# Patient Record
Sex: Female | Born: 1955 | Race: White | Hispanic: No | Marital: Married | Smoking: Never smoker
Health system: Southern US, Community
[De-identification: ages and names within clinical notes are randomized; demographics above are authoritative.]

## PROBLEM LIST (undated history)

## (undated) DIAGNOSIS — D649 Anemia, unspecified: Secondary | ICD-10-CM

## (undated) DIAGNOSIS — E785 Hyperlipidemia, unspecified: Secondary | ICD-10-CM

## (undated) DIAGNOSIS — I639 Cerebral infarction, unspecified: Secondary | ICD-10-CM

## (undated) DIAGNOSIS — K635 Polyp of colon: Secondary | ICD-10-CM

## (undated) DIAGNOSIS — Z21 Asymptomatic human immunodeficiency virus [HIV] infection status: Secondary | ICD-10-CM

## (undated) DIAGNOSIS — B029 Zoster without complications: Secondary | ICD-10-CM

## (undated) DIAGNOSIS — R519 Headache, unspecified: Secondary | ICD-10-CM

## (undated) DIAGNOSIS — R51 Headache: Secondary | ICD-10-CM

## (undated) DIAGNOSIS — E7800 Pure hypercholesterolemia, unspecified: Secondary | ICD-10-CM

## (undated) DIAGNOSIS — K648 Other hemorrhoids: Secondary | ICD-10-CM

## (undated) DIAGNOSIS — D591 Autoimmune hemolytic anemia, unspecified: Secondary | ICD-10-CM

## (undated) DIAGNOSIS — B589 Toxoplasmosis, unspecified: Secondary | ICD-10-CM

## (undated) HISTORY — DX: Anemia, unspecified: D64.9

## (undated) HISTORY — DX: Polyp of colon: K63.5

## (undated) HISTORY — DX: Zoster without complications: B02.9

## (undated) HISTORY — DX: Cerebral infarction, unspecified: I63.9

## (undated) HISTORY — PX: SHOULDER SURGERY: SHX246

## (undated) HISTORY — PX: KNEE ARTHROSCOPY: SUR90

## (undated) HISTORY — DX: Pure hypercholesterolemia, unspecified: E78.00

## (undated) HISTORY — PX: TONSILLECTOMY: SUR1361

## (undated) HISTORY — DX: Toxoplasmosis, unspecified: B58.9

## (undated) HISTORY — DX: Headache, unspecified: R51.9

## (undated) HISTORY — DX: Other hemorrhoids: K64.8

## (undated) HISTORY — DX: Hyperlipidemia, unspecified: E78.5

## (undated) HISTORY — DX: Headache: R51

---

## 1985-07-24 DIAGNOSIS — Z21 Asymptomatic human immunodeficiency virus [HIV] infection status: Secondary | ICD-10-CM

## 1985-07-24 HISTORY — DX: Asymptomatic human immunodeficiency virus (hiv) infection status: Z21

## 1997-12-21 ENCOUNTER — Encounter: Admission: RE | Admit: 1997-12-21 | Discharge: 1997-12-21 | Payer: Self-pay | Admitting: Internal Medicine

## 1998-01-04 ENCOUNTER — Encounter: Admission: RE | Admit: 1998-01-04 | Discharge: 1998-01-04 | Payer: Self-pay | Admitting: Internal Medicine

## 1998-03-16 ENCOUNTER — Encounter: Admission: RE | Admit: 1998-03-16 | Discharge: 1998-03-16 | Payer: Self-pay | Admitting: Internal Medicine

## 1998-03-30 ENCOUNTER — Encounter: Admission: RE | Admit: 1998-03-30 | Discharge: 1998-03-30 | Payer: Self-pay | Admitting: Internal Medicine

## 1998-04-02 ENCOUNTER — Emergency Department (HOSPITAL_COMMUNITY): Admission: EM | Admit: 1998-04-02 | Discharge: 1998-04-02 | Payer: Self-pay | Admitting: Emergency Medicine

## 1998-05-19 ENCOUNTER — Encounter: Admission: RE | Admit: 1998-05-19 | Discharge: 1998-05-19 | Payer: Self-pay | Admitting: Hematology and Oncology

## 1998-06-22 ENCOUNTER — Encounter: Admission: RE | Admit: 1998-06-22 | Discharge: 1998-06-22 | Payer: Self-pay | Admitting: Internal Medicine

## 1998-07-06 ENCOUNTER — Encounter: Admission: RE | Admit: 1998-07-06 | Discharge: 1998-07-06 | Payer: Self-pay | Admitting: Internal Medicine

## 1998-07-24 DIAGNOSIS — I639 Cerebral infarction, unspecified: Secondary | ICD-10-CM

## 1998-07-24 HISTORY — DX: Cerebral infarction, unspecified: I63.9

## 1998-08-12 ENCOUNTER — Other Ambulatory Visit: Admission: RE | Admit: 1998-08-12 | Discharge: 1998-08-12 | Payer: Self-pay | Admitting: Obstetrics and Gynecology

## 1998-09-05 ENCOUNTER — Observation Stay (HOSPITAL_COMMUNITY): Admission: EM | Admit: 1998-09-05 | Discharge: 1998-09-06 | Payer: Self-pay | Admitting: Emergency Medicine

## 1998-09-05 ENCOUNTER — Encounter: Payer: Self-pay | Admitting: Emergency Medicine

## 1998-09-13 ENCOUNTER — Encounter (INDEPENDENT_AMBULATORY_CARE_PROVIDER_SITE_OTHER): Payer: Self-pay | Admitting: *Deleted

## 1998-09-13 ENCOUNTER — Encounter: Admission: RE | Admit: 1998-09-13 | Discharge: 1998-09-13 | Payer: Self-pay | Admitting: Infectious Diseases

## 1998-09-13 LAB — CONVERTED CEMR LAB: CD4 Count: 280 microliters

## 1998-09-15 ENCOUNTER — Other Ambulatory Visit: Admission: RE | Admit: 1998-09-15 | Discharge: 1998-09-15 | Payer: Self-pay | Admitting: Internal Medicine

## 1998-09-28 ENCOUNTER — Encounter: Payer: Self-pay | Admitting: Internal Medicine

## 1998-09-28 ENCOUNTER — Encounter: Admission: RE | Admit: 1998-09-28 | Discharge: 1998-09-28 | Payer: Self-pay | Admitting: Internal Medicine

## 1998-09-28 ENCOUNTER — Ambulatory Visit (HOSPITAL_COMMUNITY): Admission: RE | Admit: 1998-09-28 | Discharge: 1998-09-28 | Payer: Self-pay | Admitting: Internal Medicine

## 1998-11-22 ENCOUNTER — Encounter (INDEPENDENT_AMBULATORY_CARE_PROVIDER_SITE_OTHER): Payer: Self-pay | Admitting: *Deleted

## 1998-12-13 ENCOUNTER — Ambulatory Visit (HOSPITAL_COMMUNITY): Admission: RE | Admit: 1998-12-13 | Discharge: 1998-12-13 | Payer: Self-pay | Admitting: Infectious Diseases

## 1998-12-28 ENCOUNTER — Encounter: Admission: RE | Admit: 1998-12-28 | Discharge: 1998-12-28 | Payer: Self-pay | Admitting: Internal Medicine

## 1999-02-01 ENCOUNTER — Encounter (INDEPENDENT_AMBULATORY_CARE_PROVIDER_SITE_OTHER): Payer: Self-pay | Admitting: *Deleted

## 1999-05-19 ENCOUNTER — Encounter: Admission: RE | Admit: 1999-05-19 | Discharge: 1999-05-19 | Payer: Self-pay | Admitting: Internal Medicine

## 1999-06-24 ENCOUNTER — Encounter: Payer: Self-pay | Admitting: Emergency Medicine

## 1999-06-24 ENCOUNTER — Emergency Department (HOSPITAL_COMMUNITY): Admission: EM | Admit: 1999-06-24 | Discharge: 1999-06-24 | Payer: Self-pay | Admitting: Podiatry

## 1999-06-27 ENCOUNTER — Encounter: Admission: RE | Admit: 1999-06-27 | Discharge: 1999-06-27 | Payer: Self-pay | Admitting: Internal Medicine

## 1999-06-27 ENCOUNTER — Ambulatory Visit (HOSPITAL_COMMUNITY): Admission: RE | Admit: 1999-06-27 | Discharge: 1999-06-27 | Payer: Self-pay | Admitting: Internal Medicine

## 1999-07-11 ENCOUNTER — Encounter: Admission: RE | Admit: 1999-07-11 | Discharge: 1999-07-11 | Payer: Self-pay | Admitting: Internal Medicine

## 1999-07-25 HISTORY — PX: APPENDECTOMY: SHX54

## 1999-09-29 ENCOUNTER — Other Ambulatory Visit: Admission: RE | Admit: 1999-09-29 | Discharge: 1999-09-29 | Payer: Self-pay | Admitting: Obstetrics and Gynecology

## 1999-12-26 ENCOUNTER — Encounter: Admission: RE | Admit: 1999-12-26 | Discharge: 1999-12-26 | Payer: Self-pay | Admitting: Internal Medicine

## 1999-12-26 ENCOUNTER — Ambulatory Visit (HOSPITAL_COMMUNITY): Admission: RE | Admit: 1999-12-26 | Discharge: 1999-12-26 | Payer: Self-pay | Admitting: Internal Medicine

## 1999-12-28 ENCOUNTER — Inpatient Hospital Stay (HOSPITAL_COMMUNITY): Admission: EM | Admit: 1999-12-28 | Discharge: 1999-12-30 | Payer: Self-pay | Admitting: Emergency Medicine

## 1999-12-28 ENCOUNTER — Encounter: Payer: Self-pay | Admitting: Emergency Medicine

## 1999-12-29 ENCOUNTER — Encounter: Payer: Self-pay | Admitting: Emergency Medicine

## 1999-12-29 ENCOUNTER — Encounter: Payer: Self-pay | Admitting: Internal Medicine

## 2000-01-06 ENCOUNTER — Encounter: Admission: RE | Admit: 2000-01-06 | Discharge: 2000-01-06 | Payer: Self-pay | Admitting: Internal Medicine

## 2000-01-31 ENCOUNTER — Encounter: Admission: RE | Admit: 2000-01-31 | Discharge: 2000-01-31 | Payer: Self-pay | Admitting: Internal Medicine

## 2000-02-20 ENCOUNTER — Encounter: Admission: RE | Admit: 2000-02-20 | Discharge: 2000-02-20 | Payer: Self-pay | Admitting: Internal Medicine

## 2000-03-02 ENCOUNTER — Ambulatory Visit (HOSPITAL_COMMUNITY): Admission: RE | Admit: 2000-03-02 | Discharge: 2000-03-02 | Payer: Self-pay | Admitting: Neurology

## 2000-04-16 ENCOUNTER — Encounter: Admission: RE | Admit: 2000-04-16 | Discharge: 2000-04-16 | Payer: Self-pay | Admitting: Internal Medicine

## 2000-04-16 ENCOUNTER — Ambulatory Visit (HOSPITAL_COMMUNITY): Admission: RE | Admit: 2000-04-16 | Discharge: 2000-04-16 | Payer: Self-pay | Admitting: Internal Medicine

## 2000-05-01 ENCOUNTER — Encounter: Admission: RE | Admit: 2000-05-01 | Discharge: 2000-05-01 | Payer: Self-pay | Admitting: Internal Medicine

## 2000-06-27 ENCOUNTER — Ambulatory Visit (HOSPITAL_COMMUNITY): Admission: RE | Admit: 2000-06-27 | Discharge: 2000-06-27 | Payer: Self-pay | Admitting: Neurology

## 2000-06-27 ENCOUNTER — Encounter: Payer: Self-pay | Admitting: Neurology

## 2000-07-25 ENCOUNTER — Encounter: Admission: RE | Admit: 2000-07-25 | Discharge: 2000-07-25 | Payer: Self-pay | Admitting: Internal Medicine

## 2000-07-25 ENCOUNTER — Ambulatory Visit (HOSPITAL_COMMUNITY): Admission: RE | Admit: 2000-07-25 | Discharge: 2000-07-25 | Payer: Self-pay | Admitting: Internal Medicine

## 2000-08-09 ENCOUNTER — Encounter: Admission: RE | Admit: 2000-08-09 | Discharge: 2000-08-09 | Payer: Self-pay | Admitting: Internal Medicine

## 2000-10-22 ENCOUNTER — Ambulatory Visit (HOSPITAL_COMMUNITY): Admission: RE | Admit: 2000-10-22 | Discharge: 2000-10-22 | Payer: Self-pay | Admitting: Internal Medicine

## 2000-10-22 ENCOUNTER — Encounter: Admission: RE | Admit: 2000-10-22 | Discharge: 2000-10-22 | Payer: Self-pay | Admitting: Internal Medicine

## 2000-11-06 ENCOUNTER — Encounter: Admission: RE | Admit: 2000-11-06 | Discharge: 2000-11-06 | Payer: Self-pay | Admitting: Internal Medicine

## 2000-11-19 ENCOUNTER — Other Ambulatory Visit: Admission: RE | Admit: 2000-11-19 | Discharge: 2000-11-19 | Payer: Self-pay | Admitting: Obstetrics and Gynecology

## 2001-02-13 ENCOUNTER — Ambulatory Visit (HOSPITAL_COMMUNITY): Admission: RE | Admit: 2001-02-13 | Discharge: 2001-02-13 | Payer: Self-pay | Admitting: Internal Medicine

## 2001-02-13 ENCOUNTER — Encounter: Admission: RE | Admit: 2001-02-13 | Discharge: 2001-02-13 | Payer: Self-pay | Admitting: Internal Medicine

## 2001-03-05 ENCOUNTER — Encounter: Admission: RE | Admit: 2001-03-05 | Discharge: 2001-03-05 | Payer: Self-pay | Admitting: Internal Medicine

## 2001-03-19 ENCOUNTER — Encounter: Admission: RE | Admit: 2001-03-19 | Discharge: 2001-03-19 | Payer: Self-pay | Admitting: Internal Medicine

## 2001-05-21 ENCOUNTER — Encounter: Admission: RE | Admit: 2001-05-21 | Discharge: 2001-05-21 | Payer: Self-pay | Admitting: Internal Medicine

## 2001-05-21 ENCOUNTER — Ambulatory Visit (HOSPITAL_COMMUNITY): Admission: RE | Admit: 2001-05-21 | Discharge: 2001-05-21 | Payer: Self-pay | Admitting: Internal Medicine

## 2001-06-04 ENCOUNTER — Encounter: Admission: RE | Admit: 2001-06-04 | Discharge: 2001-06-04 | Payer: Self-pay | Admitting: Internal Medicine

## 2001-10-08 ENCOUNTER — Encounter: Admission: RE | Admit: 2001-10-08 | Discharge: 2001-10-08 | Payer: Self-pay | Admitting: Internal Medicine

## 2001-10-08 ENCOUNTER — Ambulatory Visit (HOSPITAL_COMMUNITY): Admission: RE | Admit: 2001-10-08 | Discharge: 2001-10-08 | Payer: Self-pay | Admitting: Internal Medicine

## 2001-10-22 ENCOUNTER — Encounter: Admission: RE | Admit: 2001-10-22 | Discharge: 2001-10-22 | Payer: Self-pay | Admitting: Internal Medicine

## 2001-12-02 ENCOUNTER — Encounter: Admission: RE | Admit: 2001-12-02 | Discharge: 2001-12-02 | Payer: Self-pay | Admitting: Internal Medicine

## 2001-12-27 ENCOUNTER — Other Ambulatory Visit: Admission: RE | Admit: 2001-12-27 | Discharge: 2001-12-27 | Payer: Self-pay | Admitting: Obstetrics and Gynecology

## 2002-01-20 ENCOUNTER — Encounter: Admission: RE | Admit: 2002-01-20 | Discharge: 2002-01-20 | Payer: Self-pay | Admitting: Internal Medicine

## 2002-02-18 ENCOUNTER — Encounter: Admission: RE | Admit: 2002-02-18 | Discharge: 2002-02-18 | Payer: Self-pay | Admitting: Internal Medicine

## 2002-05-06 ENCOUNTER — Ambulatory Visit (HOSPITAL_COMMUNITY): Admission: RE | Admit: 2002-05-06 | Discharge: 2002-05-06 | Payer: Self-pay | Admitting: Internal Medicine

## 2002-05-06 ENCOUNTER — Encounter: Admission: RE | Admit: 2002-05-06 | Discharge: 2002-05-06 | Payer: Self-pay | Admitting: Internal Medicine

## 2002-05-20 ENCOUNTER — Encounter: Admission: RE | Admit: 2002-05-20 | Discharge: 2002-05-20 | Payer: Self-pay | Admitting: Infectious Diseases

## 2002-12-22 ENCOUNTER — Encounter: Payer: Self-pay | Admitting: Internal Medicine

## 2002-12-22 ENCOUNTER — Encounter: Admission: RE | Admit: 2002-12-22 | Discharge: 2002-12-22 | Payer: Self-pay | Admitting: Internal Medicine

## 2003-01-06 ENCOUNTER — Encounter: Admission: RE | Admit: 2003-01-06 | Discharge: 2003-01-06 | Payer: Self-pay | Admitting: Internal Medicine

## 2003-03-10 ENCOUNTER — Other Ambulatory Visit: Admission: RE | Admit: 2003-03-10 | Discharge: 2003-03-10 | Payer: Self-pay | Admitting: Obstetrics and Gynecology

## 2003-05-18 ENCOUNTER — Encounter: Payer: Self-pay | Admitting: Internal Medicine

## 2003-05-18 ENCOUNTER — Encounter: Admission: RE | Admit: 2003-05-18 | Discharge: 2003-05-18 | Payer: Self-pay | Admitting: Internal Medicine

## 2003-05-18 ENCOUNTER — Ambulatory Visit (HOSPITAL_COMMUNITY): Admission: RE | Admit: 2003-05-18 | Discharge: 2003-05-18 | Payer: Self-pay | Admitting: Internal Medicine

## 2003-06-02 ENCOUNTER — Encounter: Admission: RE | Admit: 2003-06-02 | Discharge: 2003-06-02 | Payer: Self-pay | Admitting: Internal Medicine

## 2003-12-17 ENCOUNTER — Ambulatory Visit (HOSPITAL_COMMUNITY): Admission: RE | Admit: 2003-12-17 | Discharge: 2003-12-17 | Payer: Self-pay | Admitting: Internal Medicine

## 2003-12-17 ENCOUNTER — Encounter: Admission: RE | Admit: 2003-12-17 | Discharge: 2003-12-17 | Payer: Self-pay | Admitting: Internal Medicine

## 2003-12-31 ENCOUNTER — Encounter: Admission: RE | Admit: 2003-12-31 | Discharge: 2003-12-31 | Payer: Self-pay | Admitting: Internal Medicine

## 2004-05-18 ENCOUNTER — Other Ambulatory Visit: Admission: RE | Admit: 2004-05-18 | Discharge: 2004-05-18 | Payer: Self-pay | Admitting: Obstetrics and Gynecology

## 2004-06-21 ENCOUNTER — Ambulatory Visit (HOSPITAL_COMMUNITY): Admission: RE | Admit: 2004-06-21 | Discharge: 2004-06-21 | Payer: Self-pay | Admitting: Internal Medicine

## 2004-06-21 ENCOUNTER — Ambulatory Visit: Payer: Self-pay | Admitting: Internal Medicine

## 2004-07-05 ENCOUNTER — Ambulatory Visit: Payer: Self-pay | Admitting: Internal Medicine

## 2005-01-03 ENCOUNTER — Ambulatory Visit (HOSPITAL_COMMUNITY): Admission: RE | Admit: 2005-01-03 | Discharge: 2005-01-03 | Payer: Self-pay | Admitting: Internal Medicine

## 2005-01-03 ENCOUNTER — Encounter (INDEPENDENT_AMBULATORY_CARE_PROVIDER_SITE_OTHER): Payer: Self-pay | Admitting: *Deleted

## 2005-01-03 ENCOUNTER — Ambulatory Visit: Payer: Self-pay | Admitting: Internal Medicine

## 2005-01-03 LAB — CONVERTED CEMR LAB
CD4 Count: 410 microliters
HIV 1 RNA Quant: 399 copies/mL

## 2005-01-17 ENCOUNTER — Ambulatory Visit: Payer: Self-pay | Admitting: Internal Medicine

## 2005-06-08 ENCOUNTER — Ambulatory Visit: Payer: Self-pay | Admitting: Internal Medicine

## 2005-06-12 ENCOUNTER — Other Ambulatory Visit: Admission: RE | Admit: 2005-06-12 | Discharge: 2005-06-12 | Payer: Self-pay | Admitting: Obstetrics and Gynecology

## 2005-08-09 ENCOUNTER — Encounter (INDEPENDENT_AMBULATORY_CARE_PROVIDER_SITE_OTHER): Payer: Self-pay | Admitting: *Deleted

## 2005-08-09 ENCOUNTER — Ambulatory Visit: Payer: Self-pay | Admitting: Internal Medicine

## 2005-08-09 ENCOUNTER — Ambulatory Visit (HOSPITAL_COMMUNITY): Admission: RE | Admit: 2005-08-09 | Discharge: 2005-08-09 | Payer: Self-pay | Admitting: Internal Medicine

## 2005-08-23 ENCOUNTER — Ambulatory Visit: Payer: Self-pay | Admitting: Internal Medicine

## 2005-08-31 ENCOUNTER — Ambulatory Visit (HOSPITAL_COMMUNITY): Admission: RE | Admit: 2005-08-31 | Discharge: 2005-08-31 | Payer: Self-pay | Admitting: Family Medicine

## 2006-01-23 ENCOUNTER — Ambulatory Visit: Payer: Self-pay | Admitting: Internal Medicine

## 2006-01-23 ENCOUNTER — Encounter: Admission: RE | Admit: 2006-01-23 | Discharge: 2006-01-23 | Payer: Self-pay | Admitting: Internal Medicine

## 2006-01-23 ENCOUNTER — Encounter (INDEPENDENT_AMBULATORY_CARE_PROVIDER_SITE_OTHER): Payer: Self-pay | Admitting: *Deleted

## 2006-01-23 LAB — CONVERTED CEMR LAB
CD4 Count: 470 microliters
HIV 1 RNA Quant: 399 copies/mL

## 2006-02-12 ENCOUNTER — Ambulatory Visit: Payer: Self-pay | Admitting: Internal Medicine

## 2006-04-20 ENCOUNTER — Encounter (INDEPENDENT_AMBULATORY_CARE_PROVIDER_SITE_OTHER): Payer: Self-pay | Admitting: *Deleted

## 2006-09-14 ENCOUNTER — Encounter: Payer: Self-pay | Admitting: Internal Medicine

## 2006-09-17 ENCOUNTER — Encounter (INDEPENDENT_AMBULATORY_CARE_PROVIDER_SITE_OTHER): Payer: Self-pay | Admitting: *Deleted

## 2006-09-17 LAB — CONVERTED CEMR LAB

## 2006-09-27 ENCOUNTER — Encounter: Admission: RE | Admit: 2006-09-27 | Discharge: 2006-09-27 | Payer: Self-pay | Admitting: Internal Medicine

## 2006-09-27 ENCOUNTER — Ambulatory Visit: Payer: Self-pay | Admitting: Internal Medicine

## 2006-09-30 ENCOUNTER — Encounter (INDEPENDENT_AMBULATORY_CARE_PROVIDER_SITE_OTHER): Payer: Self-pay | Admitting: *Deleted

## 2006-10-02 DIAGNOSIS — A5609 Other chlamydial infection of lower genitourinary tract: Secondary | ICD-10-CM | POA: Insufficient documentation

## 2006-10-02 DIAGNOSIS — E881 Lipodystrophy, not elsewhere classified: Secondary | ICD-10-CM | POA: Insufficient documentation

## 2006-10-02 DIAGNOSIS — F29 Unspecified psychosis not due to a substance or known physiological condition: Secondary | ICD-10-CM | POA: Insufficient documentation

## 2006-10-02 DIAGNOSIS — R519 Headache, unspecified: Secondary | ICD-10-CM | POA: Insufficient documentation

## 2006-10-02 DIAGNOSIS — Z9889 Other specified postprocedural states: Secondary | ICD-10-CM | POA: Insufficient documentation

## 2006-10-02 DIAGNOSIS — B029 Zoster without complications: Secondary | ICD-10-CM | POA: Insufficient documentation

## 2006-10-02 DIAGNOSIS — Z8679 Personal history of other diseases of the circulatory system: Secondary | ICD-10-CM | POA: Insufficient documentation

## 2006-10-02 DIAGNOSIS — B3781 Candidal esophagitis: Secondary | ICD-10-CM | POA: Insufficient documentation

## 2006-10-02 DIAGNOSIS — R51 Headache: Secondary | ICD-10-CM | POA: Insufficient documentation

## 2006-10-02 DIAGNOSIS — G609 Hereditary and idiopathic neuropathy, unspecified: Secondary | ICD-10-CM | POA: Insufficient documentation

## 2006-10-11 ENCOUNTER — Encounter (INDEPENDENT_AMBULATORY_CARE_PROVIDER_SITE_OTHER): Payer: Self-pay | Admitting: *Deleted

## 2006-10-11 ENCOUNTER — Ambulatory Visit: Payer: Self-pay | Admitting: Internal Medicine

## 2006-10-11 DIAGNOSIS — B009 Herpesviral infection, unspecified: Secondary | ICD-10-CM | POA: Insufficient documentation

## 2006-10-11 DIAGNOSIS — R634 Abnormal weight loss: Secondary | ICD-10-CM | POA: Insufficient documentation

## 2006-10-11 DIAGNOSIS — B20 Human immunodeficiency virus [HIV] disease: Secondary | ICD-10-CM | POA: Insufficient documentation

## 2006-10-11 DIAGNOSIS — K625 Hemorrhage of anus and rectum: Secondary | ICD-10-CM | POA: Insufficient documentation

## 2006-10-11 DIAGNOSIS — R4701 Aphasia: Secondary | ICD-10-CM | POA: Insufficient documentation

## 2007-02-25 ENCOUNTER — Encounter (INDEPENDENT_AMBULATORY_CARE_PROVIDER_SITE_OTHER): Payer: Self-pay | Admitting: *Deleted

## 2007-03-27 ENCOUNTER — Encounter: Admission: RE | Admit: 2007-03-27 | Discharge: 2007-03-27 | Payer: Self-pay | Admitting: Internal Medicine

## 2007-03-27 ENCOUNTER — Ambulatory Visit: Payer: Self-pay | Admitting: Internal Medicine

## 2007-04-16 ENCOUNTER — Ambulatory Visit: Payer: Self-pay | Admitting: Internal Medicine

## 2007-05-27 ENCOUNTER — Encounter: Payer: Self-pay | Admitting: Internal Medicine

## 2007-06-24 ENCOUNTER — Telehealth: Payer: Self-pay | Admitting: Internal Medicine

## 2007-07-23 ENCOUNTER — Encounter: Payer: Self-pay | Admitting: Internal Medicine

## 2007-07-23 ENCOUNTER — Encounter (INDEPENDENT_AMBULATORY_CARE_PROVIDER_SITE_OTHER): Payer: Self-pay | Admitting: *Deleted

## 2007-08-20 ENCOUNTER — Encounter: Admission: RE | Admit: 2007-08-20 | Discharge: 2007-08-20 | Payer: Self-pay | Admitting: Internal Medicine

## 2007-08-20 ENCOUNTER — Ambulatory Visit: Payer: Self-pay | Admitting: Internal Medicine

## 2007-08-20 LAB — CONVERTED CEMR LAB
ALT: 47 units/L — ABNORMAL HIGH (ref 0–35)
Alkaline Phosphatase: 126 units/L — ABNORMAL HIGH (ref 39–117)
CO2: 25 meq/L (ref 19–32)
Cholesterol: 212 mg/dL — ABNORMAL HIGH (ref 0–200)
Creatinine, Ser: 0.92 mg/dL (ref 0.40–1.20)
Glucose, Bld: 77 mg/dL (ref 70–99)
HCT: 35.7 % — ABNORMAL LOW (ref 36.0–46.0)
HIV-1 RNA Quant, Log: 1.82 — ABNORMAL HIGH (ref <1.70)
LDL Cholesterol: 141 mg/dL — ABNORMAL HIGH (ref 0–99)
MCHC: 36.4 g/dL — ABNORMAL HIGH (ref 30.0–36.0)
MCV: 120.2 fL — ABNORMAL HIGH (ref 78.0–100.0)
Platelets: 164 10*3/uL (ref 150–400)
Sodium: 142 meq/L (ref 135–145)
Total Bilirubin: 0.5 mg/dL (ref 0.3–1.2)
Total CHOL/HDL Ratio: 3.8
Total Protein: 6.1 g/dL (ref 6.0–8.3)
Triglycerides: 76 mg/dL (ref <150)
VLDL: 15 mg/dL (ref 0–40)

## 2007-08-28 ENCOUNTER — Encounter (INDEPENDENT_AMBULATORY_CARE_PROVIDER_SITE_OTHER): Payer: Self-pay | Admitting: *Deleted

## 2007-09-03 ENCOUNTER — Ambulatory Visit: Payer: Self-pay | Admitting: Internal Medicine

## 2007-10-29 ENCOUNTER — Encounter (INDEPENDENT_AMBULATORY_CARE_PROVIDER_SITE_OTHER): Payer: Self-pay | Admitting: *Deleted

## 2007-11-27 ENCOUNTER — Encounter: Payer: Self-pay | Admitting: Internal Medicine

## 2008-03-19 ENCOUNTER — Ambulatory Visit: Payer: Self-pay | Admitting: Internal Medicine

## 2008-03-19 LAB — CONVERTED CEMR LAB: HIV-1 RNA Quant, Log: 2.45 — ABNORMAL HIGH (ref <1.70)

## 2008-03-31 ENCOUNTER — Encounter (INDEPENDENT_AMBULATORY_CARE_PROVIDER_SITE_OTHER): Payer: Self-pay | Admitting: *Deleted

## 2008-03-31 ENCOUNTER — Ambulatory Visit: Payer: Self-pay | Admitting: Internal Medicine

## 2008-05-11 ENCOUNTER — Ambulatory Visit: Payer: Self-pay | Admitting: Internal Medicine

## 2008-09-23 ENCOUNTER — Ambulatory Visit: Payer: Self-pay | Admitting: Internal Medicine

## 2008-09-23 LAB — CONVERTED CEMR LAB
Albumin: 4.5 g/dL (ref 3.5–5.2)
Alkaline Phosphatase: 149 units/L — ABNORMAL HIGH (ref 39–117)
BUN: 12 mg/dL (ref 6–23)
CO2: 24 meq/L (ref 19–32)
Calcium: 9.2 mg/dL (ref 8.4–10.5)
Chloride: 108 meq/L (ref 96–112)
Cholesterol: 227 mg/dL — ABNORMAL HIGH (ref 0–200)
Eosinophils Relative: 1 % (ref 0–5)
Glucose, Bld: 74 mg/dL (ref 70–99)
HCT: 36.5 % (ref 36.0–46.0)
HIV-1 RNA Quant, Log: <1.68 (ref <1.68)
Hemoglobin: 12.5 g/dL (ref 12.0–15.0)
LDL Cholesterol: 149 mg/dL — ABNORMAL HIGH (ref 0–99)
Lymphocytes Relative: 37 % (ref 12–46)
Lymphs Abs: 1.2 10*3/uL (ref 0.7–4.0)
MCV: 119.7 fL — ABNORMAL HIGH (ref 78.0–100.0)
Monocytes Absolute: 0.3 10*3/uL (ref 0.1–1.0)
Monocytes Relative: 10 % (ref 3–12)
Neutro Abs: 1.7 10*3/uL (ref 1.7–7.7)
Potassium: 4 meq/L (ref 3.5–5.3)
Sodium: 143 meq/L (ref 135–145)
Total Protein: 6.7 g/dL (ref 6.0–8.3)
Triglycerides: 107 mg/dL (ref <150)
WBC: 3.2 10*3/uL — ABNORMAL LOW (ref 4.0–10.5)

## 2008-10-13 ENCOUNTER — Ambulatory Visit: Payer: Self-pay | Admitting: Internal Medicine

## 2009-02-04 ENCOUNTER — Encounter (INDEPENDENT_AMBULATORY_CARE_PROVIDER_SITE_OTHER): Payer: Self-pay | Admitting: *Deleted

## 2009-02-04 ENCOUNTER — Encounter: Payer: Self-pay | Admitting: Internal Medicine

## 2009-02-16 ENCOUNTER — Encounter: Payer: Self-pay | Admitting: Internal Medicine

## 2009-05-05 ENCOUNTER — Ambulatory Visit: Payer: Self-pay | Admitting: Internal Medicine

## 2009-05-05 LAB — CONVERTED CEMR LAB
HIV 1 RNA Quant: <48 copies/mL (ref <48)
HIV-1 RNA Quant, Log: <1.68 (ref <1.68)

## 2009-05-06 ENCOUNTER — Encounter (INDEPENDENT_AMBULATORY_CARE_PROVIDER_SITE_OTHER): Payer: Self-pay | Admitting: *Deleted

## 2009-05-25 ENCOUNTER — Encounter (INDEPENDENT_AMBULATORY_CARE_PROVIDER_SITE_OTHER): Payer: Self-pay | Admitting: *Deleted

## 2009-05-25 ENCOUNTER — Ambulatory Visit: Payer: Self-pay | Admitting: Internal Medicine

## 2009-06-01 ENCOUNTER — Ambulatory Visit: Payer: Self-pay | Admitting: Infectious Disease

## 2009-07-06 ENCOUNTER — Ambulatory Visit: Payer: Self-pay | Admitting: Internal Medicine

## 2009-08-31 ENCOUNTER — Ambulatory Visit: Payer: Self-pay | Admitting: Internal Medicine

## 2009-09-28 ENCOUNTER — Ambulatory Visit: Payer: Self-pay | Admitting: Internal Medicine

## 2009-11-02 ENCOUNTER — Ambulatory Visit: Payer: Self-pay | Admitting: Internal Medicine

## 2009-11-22 ENCOUNTER — Ambulatory Visit: Payer: Self-pay | Admitting: Internal Medicine

## 2009-11-23 ENCOUNTER — Encounter: Payer: Self-pay | Admitting: Internal Medicine

## 2009-11-23 LAB — CONVERTED CEMR LAB
ALT: 17 units/L (ref 0–35)
AST: 23 units/L (ref 0–37)
Basophils Relative: 0 % (ref 0–1)
Creatinine, Ser: 1.01 mg/dL (ref 0.40–1.20)
Eosinophils Absolute: 0 10*3/uL (ref 0.0–0.7)
HIV 1 RNA Quant: <48 copies/mL (ref <48)
LDL Cholesterol: 115 mg/dL — ABNORMAL HIGH (ref 0–99)
Lymphs Abs: 1.5 10*3/uL (ref 0.7–4.0)
MCHC: 33.6 g/dL (ref 30.0–36.0)
MCV: 122.6 fL — ABNORMAL HIGH (ref 78.0–100.0)
Neutro Abs: 1.7 10*3/uL (ref 1.7–7.7)
Neutrophils Relative %: 47 % (ref 43–77)
Platelets: 168 10*3/uL (ref 150–400)
Sodium: 143 meq/L (ref 135–145)
Total Bilirubin: 0.4 mg/dL (ref 0.3–1.2)
Total CHOL/HDL Ratio: 3.6
VLDL: 18 mg/dL (ref 0–40)
WBC: 3.6 10*3/uL — ABNORMAL LOW (ref 4.0–10.5)

## 2009-11-30 ENCOUNTER — Ambulatory Visit: Payer: Self-pay | Admitting: Infectious Disease

## 2009-12-07 ENCOUNTER — Ambulatory Visit: Payer: Self-pay | Admitting: Internal Medicine

## 2010-03-15 ENCOUNTER — Encounter (INDEPENDENT_AMBULATORY_CARE_PROVIDER_SITE_OTHER): Payer: Self-pay | Admitting: *Deleted

## 2010-03-15 LAB — CONVERTED CEMR LAB: Pap Smear: NORMAL

## 2010-03-18 ENCOUNTER — Encounter: Payer: Self-pay | Admitting: Internal Medicine

## 2010-06-07 ENCOUNTER — Ambulatory Visit: Payer: Self-pay | Admitting: Internal Medicine

## 2010-06-28 ENCOUNTER — Ambulatory Visit: Payer: Self-pay | Admitting: Internal Medicine

## 2010-07-12 ENCOUNTER — Ambulatory Visit: Payer: Self-pay | Admitting: Internal Medicine

## 2010-07-12 DIAGNOSIS — M719 Bursopathy, unspecified: Secondary | ICD-10-CM

## 2010-07-12 DIAGNOSIS — M67919 Unspecified disorder of synovium and tendon, unspecified shoulder: Secondary | ICD-10-CM | POA: Insufficient documentation

## 2010-07-22 ENCOUNTER — Ambulatory Visit: Payer: Self-pay | Admitting: Internal Medicine

## 2010-07-27 ENCOUNTER — Encounter: Payer: Self-pay | Admitting: Internal Medicine

## 2010-08-03 ENCOUNTER — Encounter (INDEPENDENT_AMBULATORY_CARE_PROVIDER_SITE_OTHER): Payer: Self-pay | Admitting: *Deleted

## 2010-08-15 ENCOUNTER — Encounter: Payer: Self-pay | Admitting: Internal Medicine

## 2010-08-21 LAB — CONVERTED CEMR LAB
ALT: 15 units/L (ref 0–35)
AST: 18 units/L (ref 0–37)
Albumin: 4.7 g/dL (ref 3.5–5.2)
Alkaline Phosphatase: 117 units/L (ref 39–117)
BUN: 12 mg/dL (ref 6–23)
Basophils Absolute: 0 10*3/uL (ref 0.0–0.1)
Basophils Relative: 0 % (ref 0–1)
CO2: 20 meq/L (ref 19–32)
Calcium: 9.3 mg/dL (ref 8.4–10.5)
Chloride: 108 meq/L (ref 96–112)
Cholesterol: 180 mg/dL (ref 0–200)
Creatinine, Ser: 0.82 mg/dL (ref 0.40–1.20)
Eosinophils Absolute: 0 10*3/uL (ref 0.0–0.7)
Eosinophils Relative: 0 % (ref 0–5)
Glucose, Bld: 81 mg/dL (ref 70–99)
HCT: 39.4 % (ref 36.0–46.0)
HDL: 53 mg/dL (ref >39)
HIV 1 RNA Quant: <50 copies/mL (ref <50)
HIV-1 RNA Quant, Log: <1.7 (ref <1.70)
Hemoglobin: 13.5 g/dL (ref 12.0–15.0)
LDL Cholesterol: 114 mg/dL — ABNORMAL HIGH (ref 0–99)
Lymphocytes Relative: 33 % (ref 12–46)
Lymphs Abs: 1 10*3/uL (ref 0.7–3.3)
MCHC: 34.3 g/dL (ref 30.0–36.0)
MCV: 122 fL — ABNORMAL HIGH (ref 78.0–100.0)
Monocytes Absolute: 0.3 10*3/uL (ref 0.2–0.7)
Monocytes Relative: 9 % (ref 3–11)
Neutro Abs: 1.8 10*3/uL (ref 1.7–7.7)
Neutrophils Relative %: 58 % (ref 43–77)
Platelets: 179 10*3/uL (ref 150–400)
Potassium: 4.2 meq/L (ref 3.5–5.3)
RBC: 3.23 M/uL — ABNORMAL LOW (ref 3.87–5.11)
RDW: 13 % (ref 11.5–14.0)
Sodium: 143 meq/L (ref 135–145)
Total Bilirubin: 0.6 mg/dL (ref 0.3–1.2)
Total CHOL/HDL Ratio: 3.4
Total Protein: 6.8 g/dL (ref 6.0–8.3)
Triglycerides: 65 mg/dL (ref <150)
VLDL: 13 mg/dL (ref 0–40)
WBC: 3.1 10*3/uL — ABNORMAL LOW (ref 4.0–10.5)

## 2010-08-23 NOTE — Assessment & Plan Note (Signed)
Summary: CLASS [MKJ]   Allergies: 1)  ! Pcn 2)  ! Septra 3)  ! * Zerit  Patient attended a 1 hour Extreme Makeover: Lifestyle meeting today. The meeting included information about: cooking- a food demo, ,goal setting, problem solving, and exercises to strengthen the lower and upper body.   Please ask patient what they learned at their next visit.   Thank you for the referral.

## 2010-08-23 NOTE — Assessment & Plan Note (Signed)
Summary: EXTREME M/O CLASS/CH   Patient attended a 1 hour Extreme Makeover: Lifestyle meeting today. The meeting included information about: cooking- a food demo, healthy eating (portion control, lower and healthy fat, weight loss tips. .   Please ask patient what they learned at their next visit.   Thank you for the referral.

## 2010-08-23 NOTE — Progress Notes (Signed)
Summary: [facility]   Imported By: Sherian Rein 06/10/2010 12:55:10  _____________________________________________________________________  External Attachment:    Type:   Image     Comment:   External Document

## 2010-08-23 NOTE — Assessment & Plan Note (Signed)
Summary: CONSULT BEFORE COL PT ON PLAVIX.Theresa Morrison   History of Present Illness Visit Type: Initial Consult Primary GI MD: Yancey Flemings MD Primary Provider: Merri Brunette, MD Requesting Provider: Dr Harold Hedge Chief Complaint: Patient here to discuss having a colonoscopy. She denies any current GI problems. History of Present Illness:   55 year old female with a history of HIV disease, cerebrovascular accident for which she is on aspirin Plavix, lipodystrophy, peripheral neuropathy. She is sent today regarding routine screening colonoscopy. The patient underwent both upper endoscopy and complete colonoscopy in July of 2000 with Dr. Victorino Dike. These exams were normal. The patient denies any active GI symptoms except for chronic constipation which is intermittent.   GI Review of Systems      Denies abdominal pain, acid reflux, belching, bloating, chest pain, dysphagia with liquids, dysphagia with solids, heartburn, loss of appetite, nausea, vomiting, vomiting blood, weight loss, and  weight gain.      Reports constipation.     Denies anal fissure, black tarry stools, change in bowel habit, diarrhea, diverticulosis, fecal incontinence, heme positive stool, hemorrhoids, irritable bowel syndrome, jaundice, light color stool, liver problems, rectal bleeding, and  rectal pain. Preventive Screening-Counseling & Management  Alcohol-Tobacco     Smoking Status: never  Caffeine-Diet-Exercise     Does Patient Exercise: yes      Drug Use:  no.      Current Medications (verified): 1)  Truvada 200-300 Mg Tabs (Emtricitabine-Tenofovir) .... Take 1 Tablet By Mouth Once A Day 2)  Viramune 200 Mg Tabs (Nevirapine) .... Take 1 Tablet By Mouth Two Times A Day 3)  Aspirin  Tabs (Aspirin Tabs) .... Take 1 Tablet By Mouth Once A Day 4)  Plavix 75 Mg Tabs (Clopidogrel Bisulfate) .... Take 1 Tablet By Mouth Once A Day 5)  Calcium Carbonate 600 Mg Tabs (Calcium Carbonate) .... Take 1 Tablet By Mouth Once A Day 6)   Multivitamins   Tabs (Multiple Vitamin) .... Take 1 Tablet By Mouth Once A Day 7)  Fluzone   Inj (Influenza Virus Vaccine Split) .... One Yearly  Allergies (verified): 1)  ! Pcn 2)  ! Septra 3)  ! * Zerit  Past History:  Past Medical History: Reviewed history from 06/03/2010 and no changes required. Headache HIV disease Peripheral neuropathy Cerebrovascular accident, hx of Aphasia Rectal Bleeding Chlamydia cervicitis Weight loss Herpes simplex Candida esophagitis Lipodystrophy Zoster Chest pain Confusional state Hemorrhoids Anemia  Past Surgical History: Reviewed history from 10/02/2006 and no changes required. Appendectomy Tonsillectomy Knee surgery, right  Family History: Family History of Breast Cancer:Mother No FH of Colon Cancer: Family History of Prostate Cancer: Uncle Family History of Diabetes: Grandfather Family History of Heart Disease: Father  Social History: Patient has never smoked.  Alcohol Use - no Daily Caffeine Use-2 cups daily Illicit Drug Use - no Patient gets regular exercise. Drug Use:  no  Review of Systems  The patient denies allergy/sinus, anemia, anxiety-new, arthritis/joint pain, back pain, blood in urine, breast changes/lumps, change in vision, confusion, cough, coughing up blood, depression-new, fainting, fatigue, fever, headaches-new, hearing problems, heart murmur, heart rhythm changes, itching, menstrual pain, muscle pains/cramps, night sweats, nosebleeds, pregnancy symptoms, shortness of breath, skin rash, sleeping problems, sore throat, swelling of feet/legs, swollen lymph glands, thirst - excessive , urination - excessive , urination changes/pain, urine leakage, vision changes, and voice change.    Vital Signs:  Patient profile:   55 year old female Menstrual status:  postmenopausal Height:      64 inches  Weight:      170.50 pounds BMI:     29.37 BSA:     1.83 Pulse rate:   80 / minute Pulse rhythm:   regular BP  sitting:   114 / 64  (right arm)  Vitals Entered By: Lamona Curl CMA Duncan Dull) (June 07, 2010 10:29 AM)  Physical Exam  General:  Well developed, well nourished, no acute distress. Head:  Normocephalic and atraumatic. Eyes:  PERRLA, no icterus. Ears:  Normal auditory acuity. Mouth:  no thrush Neck:  Supple; no masses or thyromegaly. Lungs:  Clear throughout to auscultation. Heart:  Regular rate and rhythm; no murmurs, rubs,  or bruits. Abdomen:  Soft, nontender and nondistended. Trunkal obesity. No masses, hepatosplenomegaly or hernias noted. Normal bowel sounds. Rectal:  deferred until procedure Msk:  Symmetrical with no gross deformities. Normal posture. Pulses:  Normal pulses noted. Extremities:  No clubbing, cyanosis, edema or deformities noted. Neurologic:  Alert and  oriented x4. Skin:  Intact without significant lesions or rashes. Psych:  Alert and cooperative. Normal mood and affect.   Impression & Recommendations:  Problem # 1:  SCREENING COLORECTAL-CANCER (ICD-V76.51) screening colonoscopy. The patient is an appropriate candidate without contraindication. She isn't high risk given her need for Plavix therapy. We discussed the pros and cons of interrupting Plavix therapy. As such we have elected to proceed on Plavix and aspirin. In nature colonoscopy as well as risks, benefits, and alternatives were reviewed. She understood and agreed to proceed. Movi prep prescribed. The patient instructed on its use.  Problem # 2:  CVA (ICD-434.91) patient on aspirin and Plavix. We discussed the pros and cons of proceeding with colonoscopy on Plavix versus off Plavix. She understands the issues. See above.  Other Orders: Colonoscopy (Colon)  Patient Instructions: 1)  Colon [facility] 07/22/10 1:30 pm arrive at 12:30 pm 2)  Movi prep instructions given 3)  Movi prep Rx. sent to pharmacy. 4)  Stay on Plavix and Aspirin per Dr. Marina Goodell. 5)  Colonoscopy and Flexible Sigmoidoscopy  brochure given.  6)  Copy sent to : Merri Brunette, MD Dr Harold Hedge 7)  The medication list was reviewed and reconciled.  All changed / newly prescribed medications were explained.  A complete medication list was provided to the patient / caregiver. Prescriptions: MOVIPREP 100 GM  SOLR (PEG-KCL-NACL-NASULF-NA ASC-C) As per prep instructions.  #1 x 0   Entered by:   Milford Cage NCMA   Authorized by:   Hilarie Fredrickson MD   Signed by:   Milford Cage NCMA on 06/07/2010   Method used:   Electronically to        [facility] [phone]* (retail)       [address]       [place], [place]  [address]       Ph: [phone] or [phone]       Fax: [phone]   RxID:   [id number]

## 2010-08-23 NOTE — Letter (Signed)
Summary: [facility] Instructions  [facility]  [address] [place], [place] [address]   Phone: [phone]  Fax: [phone]       Theresa Morrison    1955/11/22    MRN: [id number]        Procedure Day Dorna Bloom: Farrell Ours  07/22/10     Arrival Time:12:30 PM     Procedure Time:1:30 PM     Location of Procedure:                    X  [facility] (4th Floor)              PREPARATION FOR COLONOSCOPY WITH MOVIPREP   Starting 5 days prior to your procedure 12/25/11do not eat nuts, seeds, popcorn, corn, beans, peas,  salads, or any raw vegetables.  Do not take any fiber supplements (e.g. Metamucil, Citrucel, and Benefiber).  THE DAY BEFORE YOUR PROCEDURE         DATE: 07/21/10  DAY: THURSDAY  1.  Drink clear liquids the entire day-NO SOLID FOOD  2.  Do not drink anything colored red or purple.  Avoid juices with pulp.  No orange juice.  3.  Drink at least 64 oz. (8 glasses) of fluid/clear liquids during the day to prevent dehydration and help the prep work efficiently.  CLEAR LIQUIDS INCLUDE: Water Jello Ice Popsicles Tea (sugar ok, no milk/cream) Powdered fruit flavored drinks Coffee (sugar ok, no milk/cream) Gatorade Juice: apple, white grape, white cranberry  Lemonade Clear bullion, consomm, broth Carbonated beverages (any kind) Strained chicken noodle soup Hard Candy                             4.  In the morning, mix first dose of MoviPrep solution:    Empty 1 Pouch A and 1 Pouch B into the disposable container    Add lukewarm drinking water to the top line of the container. Mix to dissolve    Refrigerate (mixed solution should be used within 24 hrs)  5.  Begin drinking the prep at 5:00 p.m. The MoviPrep container is divided by 4 marks.   Every 15 minutes drink the solution down to the next mark (approximately 8 oz) until the full liter is complete.   6.  Follow completed prep with 16 oz of clear liquid of your choice (Nothing red or  purple).  Continue to drink clear liquids until bedtime.  7.  Before going to bed, mix second dose of MoviPrep solution:    Empty 1 Pouch A and 1 Pouch B into the disposable container    Add lukewarm drinking water to the top line of the container. Mix to dissolve    Refrigerate  THE DAY OF YOUR PROCEDURE      DATE:07/22/10 DAY: FRIDAY  Beginning at 8:30 a.m. (5 hours before procedure):         1. Every 15 minutes, drink the solution down to the next mark (approx 8 oz) until the full liter is complete.  2. Follow completed prep with 16 oz. of clear liquid of your choice.    3. You may drink clear liquids until 11:30 AM (2 HOURS BEFORE PROCEDURE).   MEDICATION INSTRUCTIONS  Unless otherwise instructed, you should take regular prescription medications with a small sip of water   as early as possible the morning of your procedure.  STAY ON PLAVIX AND ASPIRIN PER DR. PERRY  OTHER INSTRUCTIONS  You will need a responsible adult at least 54 years of age to accompany you and drive you home.   This person must remain in the waiting room during your procedure.  Wear loose fitting clothing that is easily removed.  Leave jewelry and other valuables at home.  However, you may wish to bring a book to read or  an iPod/MP3 player to listen to music as you wait for your procedure to start.  Remove all body piercing jewelry and leave at home.  Total time from sign-in until discharge is approximately 2-3 hours.  You should go home directly after your procedure and rest.  You can resume normal activities the  day after your procedure.  The day of your procedure you should not:   Drive   Make legal decisions   Operate machinery   Drink alcohol   Return to work  You will receive specific instructions about eating, activities and medications before you leave.    The above instructions have been reviewed and explained to me by   _______________________    I fully  understand and can verbalize these instructions _____________________________ Date _________

## 2010-08-23 NOTE — Assessment & Plan Note (Signed)
Summary: EXTREME M/O CLASS/CH   Allergies: 1)  ! Pcn 2)  ! Septra 3)  ! * Zerit  Patient attended a 1 hour Extreme Makeover: Lifestyle meeting today. The meeting included information about: cooking- a food demo, healthy eating (portion control, lower and healthy fat,  high fiber and more fruits and vegetables), weight loss tips, and how to start a walking program and how to use a pedometer to track walking progress .   Please ask patient what they learned at their next visit.   Thank you for the referral.

## 2010-08-23 NOTE — Assessment & Plan Note (Signed)
Summary: F/U/VS   CC:  follow-up visit.  History of Present Illness: Theresa Morrison is in for her routine visit.  She has attended some Theresa Morrison's lifestyle classes and has learned what she could do to lose weight.  She is now eating breakfast and trying to count calories.  She also has a pedometer and is counting her steps each day.  Nonetheless, she is frustrated with her inability to lose weight so far.  She is not changed or missed any of her medications.  Preventive Screening-Counseling & Management  Alcohol-Tobacco     Alcohol drinks/day: 0     Smoking Status: never  Caffeine-Diet-Exercise     Caffeine use/day: yes     Does Patient Exercise: yes     Type of exercise: walking     Exercise (avg: min/session): <30     Times/week: 3  Hep-HIV-STD-Contraception     HIV Risk: no risk noted  Safety-Violence-Falls     Seat Belt Use: yes  Comments: declined condoms      Sexual History:  n/a.        Drug Use:  never.     Current Allergies (reviewed today): ! PCN ! SEPTRA ! * ZERIT Social History: Sexual History:  n/a  Vital Signs:  Patient profile:   55 year old female Menstrual status:  postmenopausal Height:      64 inches (162.56 cm) Weight:      169.0 pounds (76.82 kg) BMI:     29.11 Temp:     97.7 degrees F (36.50 degrees C) oral Pulse rate:   73 / minute BP sitting:   115 / 77  (left arm) Cuff size:   regular  Vitals Entered By: Jennet Maduro RN (Dec 07, 2009 9:55 AM) CC: follow-up visit Is Patient Diabetic? No Pain Assessment Patient in pain? no      Nutritional Status BMI of 25 - 29 = overweight Nutritional Status Detail appetite "I don't get hungry."  Have you ever been in a relationship where you felt threatened, hurt or afraid?No   Does patient need assistance? Functional Status Self care Ambulation Normal Comments may have missed a couple of doses   Physical Exam  General:  alert.  her dramatic lipoatrophy and central adiposity  is unchanged. her weight is unchanged. Mouth:  good dentition and pharynx pink and moist.   Lungs:  normal breath sounds.  no crackles and no wheezes.   Heart:  normal rate, regular rhythm, and no murmur.   Abdomen:  soft and non-tender.  marked central adiposity. Skin:  no rashes.   Psych:  normally interactive, good eye contact, not anxious appearing, and not depressed appearing.      Impression & Recommendations:  Problem # 1:  HIV DISEASE (ICD-042) her infection remains under excellent control.  The AZT component of her Combivir may be contributing to her striking like dystrophy.  Therefore, I will change Combivir and Viread to Truvada and continue Viramune. 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 Orders: Est. Patient Level IV ([address])  Medications Added to Medication List This Visit: 1)  Truvada 200-300 Mg Tabs (Emtricitabine-tenofovir) .... Take 1 tablet by mouth once a day  Other Orders: Future Orders: T-CD4SP ([facility]) (CD4SP) ... 06/05/2010 T-HIV Viral Load [id number]) ... 06/05/2010  Patient Instructions: 1)  Please schedule a follow-up appointment in 6 months.  Prescriptions: TRUVADA 200-300 MG TABS (EMTRICITABINE-TENOFOVIR) Take 1 tablet by mouth once a day  #30 x 11   Entered and Authorized by:   Cliffton Asters MD   Signed by:   Cliffton Asters MD on 12/07/2009   Method used:   Print then Give to Patient   RxID:   [id number]

## 2010-08-23 NOTE — Letter (Signed)
Summary: Previsit letter  [facility]  [address] [place], [place] [address]   Phone: [phone]  Fax: [phone]       03/18/2010 MRN: [id number]  Theresa Morrison [address] [place], [place]  [address]  Dear Ms. Soja,  Welcome to the Gastroenterology Division at [facility].    You are scheduled to see a nurse for your pre-procedure visit on 04-21-10 at 2:30pm on the 3rd floor at [facility], [address]  We ask that you try to arrive at our office 15 minutes prior to your appointment time to allow for check-in.  Your nurse visit will consist of discussing your medical and surgical history, your immediate family medical history, and your medications.    Please bring a complete list of all your medications or, if you prefer, bring the medication bottles and we will list them.  We will need to be aware of both prescribed and over the counter drugs.  We will need to know exact dosage information as well.  If you are on blood thinners (Coumadin, Plavix, Aggrenox, Ticlid, etc.) please call our office today/prior to your appointment, as we need to consult with your physician about holding your medication.   Please be prepared to read and sign documents such as consent forms, a financial agreement, and acknowledgement forms.  If necessary, and with your consent, a friend or relative is welcome to sit-in on the nurse visit with you.  Please bring your insurance card so that we may make a copy of it.  If your insurance requires a referral to see a specialist, please bring your referral form from your primary care physician.  No co-pay is required for this nurse visit.     If you cannot keep your appointment, please call [phone] to cancel or reschedule prior to your appointment date.  This allows [place] the opportunity to schedule an appointment for another patient in need of care.    Thank you for choosing [facility] for your medical  needs.  We appreciate the opportunity to care for you.  Please visit [place] at our website  to learn more about our practice.                     Sincerely.                                                                                                                   The Gastroenterology Division

## 2010-08-23 NOTE — Assessment & Plan Note (Signed)
Summary: life style class/cfb   Allergies: 1)  ! Pcn 2)  ! Septra 3)  ! * Zerit Patient attended a 1 hour Extreme Makeover: Lifestyle meeting today. The meeting included information about: cooking- a food demo, healthy eating (portion control, lower and healthy fat,  high fiber and more fruits and vegetables), weight loss tips, moving our body more.   Please ask patient what they learned at their next visit.   Thank you for the referral.    Complete Medication List: 1)  Combivir 150-300 Mg Tabs (Lamivudine-zidovudine) .... Take 1 tablet by mouth two times a day 2)  Viread 300 Mg Tabs (Tenofovir disoproxil fumarate) .... Take 1 tablet by mouth once a day 3)  Viramune 200 Mg Tabs (Nevirapine) .... Take 1 tablet by mouth two times a day 4)  Aspirin Tabs (Aspirin tabs) .... Take 1 tablet by mouth once a day 5)  Plavix 75 Mg Tabs (Clopidogrel bisulfate) .... Take 1 tablet by mouth once a day 6)  Calcium Carbonate 600 Mg Tabs (Calcium carbonate) .... Take 1 tablet by mouth two times a day 7)  Multivitamins Tabs (Multiple vitamin) .... Take 1 tablet by mouth once a day 8)  Fluzone Inj (Influenza virus vaccine split) .... One yearly  Other Orders: No Charge Patient Arrived (NCPA0) (NCPA0)

## 2010-08-23 NOTE — Procedures (Signed)
Summary: Upper Endoscopy/[facility]  Upper Endoscopy/[facility]   Imported By: Sherian Rein 06/10/2010 12:44:28  _____________________________________________________________________  External Attachment:    Type:   Image     Comment:   External Document

## 2010-08-25 NOTE — Procedures (Signed)
Summary: [employer]   Imported By: Sherian Rein 06/10/2010 12:52:22  _____________________________________________________________________  External Attachment:    Type:   Image     Comment:   External Document

## 2010-08-25 NOTE — Procedures (Signed)
Summary: Colonoscopy  Patient: Theresa Morrison Note: All result statuses are Final unless otherwise noted.  Tests: (1) Colonoscopy (COL)   COL Colonoscopy           DONE     [facility]     [address]     [place], [place]  [address]           COLONOSCOPY PROCEDURE REPORT           PATIENT:  Theresa Morrison, Theresa Morrison  MR#:  [id number]     BIRTHDATE:  1955/10/06, 54 yrs. old  GENDER:  female     ENDOSCOPIST:  Wilhemina Bonito. Eda Keys, MD     REF. BY:  Harold Hedge, M.D.     PROCEDURE DATE:  07/22/2010     PROCEDURE:  Colonoscopy with snare polypectomy x 4     ASA CLASS:  Class II     INDICATIONS:  Routine Risk Screening     MEDICATIONS:   Fentanyl 75 mcg IV, Versed 8 mg IV           DESCRIPTION OF PROCEDURE:   After the risks benefits and     alternatives of the procedure were thoroughly explained, informed     consent was obtained.  Digital rectal exam was performed and     revealed no abnormalities.   The LB CF-[id number] endoscope     was introduced through the anus and advanced to the cecum, which     was identified by both the appendix and ileocecal valve, without     limitations.Time to cecum = 5:31 min.  The quality of the prep was     good, using MoviPrep.  The instrument was then slowly withdrawn     (time = 21:37 min) as the colon was fully examined.     <<PROCEDUREIMAGES>>           FINDINGS:  Four polyps were found - 72mm,4mm in the cecum and     29mm,3mm in the ascending colon. Polyps were snared without     cautery. Retrieval was successful.  Otherwise normal colonoscopy     without other polyps, masses, vascular ectasias, or inflammatory     changes.   Retroflexed views in the rectum revealed internal     hemorrhoids.    The scope was then withdrawn from the patient and     the procedure completed.           COMPLICATIONS:  None     ENDOSCOPIC IMPRESSION:     1) Four polyps - removed     2) Otherwise normal colonoscopy     3) Internal hemorrhoids          RECOMMENDATIONS:     1) Follow up colonoscopy in 5 years           ______________________________     Wilhemina Bonito. Eda Keys, MD           CC:  The Patient; Harold Hedge, MD;Walter Terri Piedra, MD;John     Orvan Falconer, MD           n.     Rosalie DoctorWilhemina Bonito. Eda Keys at 07/22/2010 02:45 PM           [place], [phone]  Note: An exclamation mark (!) indicates a result that was not dispersed into the flowsheet. Document Creation Date: 07/22/2010 2:45 PM _______________________________________________________________________  (1) Order result status: Final Collection or observation date-time: 07/22/2010 14:21 Requested date-time:  Receipt date-time:  Reported date-time:  Referring Physician:   Ordering Physician: Fransico Setters [phone]) Specimen Source:  Source: Launa Grill Order Number: [id number] Lab site:   Appended Document: Colonoscopy     Procedures Next Due Date:    Colonoscopy: 07/2015

## 2010-08-25 NOTE — Letter (Signed)
Summary: [facility] Mammogram 03/22/10  [facility] 03/22/10   Imported By: Florinda Marker 08/15/2010 09:50:40  _____________________________________________________________________  External Attachment:    Type:   Image     Comment:   External Document
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
## 2010-08-25 NOTE — Letter (Signed)
Summary: Patient Notice- Polyp Results  [facility]  [address] [place], [place] [address]   Phone: [phone]  Fax: [phone]        July 27, 2010 MRN: [id number]    Theresa Morrison [address] [place], [place]  [address]    Dear Ms. Can,  I am pleased to inform you that the colon polyp(s) removed during your recent colonoscopy was (were) found to be benign (no cancer detected) upon pathologic examination.  I recommend you have a repeat colonoscopy examination in 5 years to look for recurrent polyps, as having colon polyps increases your risk for having recurrent polyps or even colon cancer in the future.  Should you develop new or worsening symptoms of abdominal pain, bowel habit changes or bleeding from the rectum or bowels, please schedule an evaluation with either your primary care physician or with me.  Additional information/recommendations:  __ No further action with gastroenterology is needed at this time. Please      follow-up with your primary care physician for your other healthcare      needs.   Please call us if you are having persistent problems or have questions about your condition that have not been fully answered at this time.  Sincerely,  Hilarie Fredrickson MD  This letter has been electronically signed by your physician.  Appended Document: Patient Notice- Polyp Results Letter mailed

## 2010-08-25 NOTE — Miscellaneous (Signed)
Summary: RYAN WHITE FLOWSHEET UPDATED  Clinical Lists Changes  Observations: Added new observation of MAMMO DUE: 03/16/2011 (03/15/2010 14:29) Added new observation of MAMMOGRAM: NORMAL (03/15/2010 14:29) Added new observation of LASTBONESCAN: 03/15/2010 (03/15/2010 14:29) Added new observation of BONEDENSRES: OSTEOPENIA (03/15/2010 14:29) Added new observation of PAP SMEAR: NORMAL (03/15/2010 14:29) Added new observation of LAST PAP DAT: 03/15/2010 (03/15/2010 14:29)

## 2010-09-13 ENCOUNTER — Encounter: Payer: Self-pay | Admitting: Internal Medicine

## 2010-09-13 ENCOUNTER — Telehealth (INDEPENDENT_AMBULATORY_CARE_PROVIDER_SITE_OTHER): Payer: Self-pay | Admitting: *Deleted

## 2010-09-19 ENCOUNTER — Encounter: Payer: Self-pay | Admitting: Internal Medicine

## 2010-09-20 NOTE — Progress Notes (Signed)
Summary: referral requested  Phone Note Call from Patient   Caller: Patient Summary of Call: pt. called because she would like a referral to sports medicine clinic for right rotator cuff injury.  I will set this up and notify pt.  This was previously discussed with  Dr. Orvan Falconer Initial call taken by: Wendall Mola CMA Duncan Dull),  September 13, 2010 9:51 AM

## 2010-09-20 NOTE — Miscellaneous (Signed)
  Clinical Lists Changes  Orders: Added new Referral order of Sports Medicine (Sports Med) - Signed 

## 2010-10-04 LAB — T-HELPER CELL (CD4) - (RCID CLINIC ONLY): CD4 % Helper T Cell: 40 % (ref 33–55)

## 2010-10-04 NOTE — Consult Note (Signed)
Summary: [facility]   Imported By: Florinda Marker 09/29/2010 11:51:49  _____________________________________________________________________  External Attachment:    Type:   Image     Comment:   External Document

## 2010-10-06 ENCOUNTER — Other Ambulatory Visit: Payer: Self-pay | Admitting: Orthopedic Surgery

## 2010-10-06 DIAGNOSIS — M25511 Pain in right shoulder: Secondary | ICD-10-CM

## 2010-10-11 LAB — T-HELPER CELL (CD4) - (RCID CLINIC ONLY): CD4 T Cell Abs: 480 uL (ref 400–2700)

## 2010-10-14 ENCOUNTER — Ambulatory Visit
Admission: RE | Admit: 2010-10-14 | Discharge: 2010-10-14 | Disposition: A | Discharge status: Home / Self Care | Payer: Federal, State, Local not specified - PPO | Source: Ambulatory Visit | Attending: Orthopedic Surgery | Admitting: Orthopedic Surgery

## 2010-10-14 DIAGNOSIS — M25511 Pain in right shoulder: Secondary | ICD-10-CM

## 2010-12-02 ENCOUNTER — Other Ambulatory Visit: Payer: Self-pay | Admitting: *Deleted

## 2010-12-02 DIAGNOSIS — B20 Human immunodeficiency virus [HIV] disease: Secondary | ICD-10-CM

## 2010-12-02 MED ORDER — NEVIRAPINE ER 400 MG PO TB24: 400.0000 mg | ORAL_TABLET | Freq: Every day | ORAL | Status: DC

## 2010-12-09 NOTE — Procedures (Signed)
[facility] [place]  Patient:    Theresa Morrison, Theresa Morrison                  MRN: [id number] Proc. Date: 03/02/00 Adm. Date:  [id number] Attending:  Lesly Dukes CC:         [facility], [address]   Procedure Report  HISTORY OF PRESENT ILLNESS:  This is a 55 year old patient with a history of HIV infection.  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  This patient is being evaluated for the possibility of a hypercoagulable state or infectious source of stroke/vasculitis.  The risks of the procedure were explained to the patient, to include bleeding, nerve damage, headache, and introducing infection.  The patient agreed to the procedure and understood the procedure.  DESCRIPTION OF PROCEDURE:  The patient was placed in the fetal position on the right side.  The low back was cleaned with Betadine solution.  Xylocaine 1% 2 cc was used as local anesthetic.  A 20-gauge spinal needle was inserted into the L3-4 interspace, and approximately 16 cc of clear-colored spinal fluid was removed for testing.  Opening pressure was 260 mmH2O.  Tube #1 was sent for VDRL and cryptococcal antigen.  Tube #2 was sent for herpes and CMV and PCR.  Tube #3 was sent for cells, differential, protein, glucose.  Tube #4 was sent for TB and fungal cultures.  The patient tolerated the procedure well. No complications of the above procedure were noted.  Blood work will also be sent for comprehensive metabolic profile, CBC, sedimentation rate, ANA, rheumatoid factor, ANCA level, homocystine, protein S, protein C, antithrombin III levels, and anti-phospholipid antibody panel.  The patient may be discharged to home in 1-1/2 hours following the procedure. DD:  03/02/00 TD:  03/02/00 Job: [id number]

## 2010-12-09 NOTE — Procedures (Signed)
[facility]  Patient:    Theresa Morrison, Theresa Morrison                  MRN: [id number] Adm. Date:  [id number] Disc. Date: [id number] Attending:  Madaline Guthrie CC:         [facility]                           Procedure Report  INDICATIONS:  TIA, CVA.  Rule out source of embolus.  DESCRIPTION OF PROCEDURE:  The patient was sedated with 4 mg of Versed.  Using digital technique, an Omniplane probe was advanced into the distal esophagus without incident.  Transgastric imaging revealed normal LV and RV function. Ejection fraction was in excess of 60%.  There were no mural thrombi.  Mitral aortic tricuspid valves were structurally normal.  There was no MR, no aortic insufficiency.  The right coronary sinus was somewhat elongated and dilated, but there was no evidence of an aneurysm or fistulous flow.  The left atrial appendage was small and hypercontractile.  There was no left atrial appendage thrombus.  There was ______ hypertrophy of the atrial septum.  There was no ASD.  ______ study was of excellent quality and was negative with no right to left shunting.  Right-sided cardiac chambers were normal.  Imaging of the aorta showed no debris.  FINAL IMPRESSION:  Normal transesophageal echocardiogram with no source of embolus.  The patient tolerated the procedure well.  She will have further workup in regards to possible toxoplasmosis by her primary physician. DD:  12/30/99 TD:  01/03/00 Job: [id number]

## 2011-01-23 ENCOUNTER — Other Ambulatory Visit: Payer: Self-pay | Admitting: *Deleted

## 2011-01-23 DIAGNOSIS — B20 Human immunodeficiency virus [HIV] disease: Secondary | ICD-10-CM

## 2011-01-23 MED ORDER — EMTRICITABINE-TENOFOVIR DF 200-300 MG PO TABS: 1.0000 | ORAL_TABLET | Freq: Every day | ORAL | Status: DC

## 2011-02-08 ENCOUNTER — Other Ambulatory Visit: Payer: Federal, State, Local not specified - PPO

## 2011-02-08 DIAGNOSIS — B20 Human immunodeficiency virus [HIV] disease: Secondary | ICD-10-CM

## 2011-02-08 DIAGNOSIS — Z113 Encounter for screening for infections with a predominantly sexual mode of transmission: Secondary | ICD-10-CM

## 2011-02-08 DIAGNOSIS — Z79899 Other long term (current) drug therapy: Secondary | ICD-10-CM

## 2011-02-09 LAB — COMPLETE METABOLIC PANEL WITH GFR
ALT: 16 U/L (ref 0–35)
AST: 22 U/L (ref 0–37)
BUN: 14 mg/dL (ref 6–23)
Calcium: 9.1 mg/dL (ref 8.4–10.5)
Chloride: 107 mEq/L (ref 96–112)
Creat: 1.11 mg/dL — ABNORMAL HIGH (ref 0.50–1.10)
Total Bilirubin: 0.4 mg/dL (ref 0.3–1.2)

## 2011-02-09 LAB — HIV-1 RNA QUANT-NO REFLEX-BLD
HIV 1 RNA Quant: <20 copies/mL (ref <20)
HIV-1 RNA Quant, Log: <1.3 {Log} (ref <1.30)

## 2011-02-09 LAB — T-HELPER CELL (CD4) - (RCID CLINIC ONLY): CD4 T Cell Abs: 490 uL (ref 400–2700)

## 2011-02-09 LAB — CBC WITH DIFFERENTIAL/PLATELET
Hemoglobin: 12.2 g/dL (ref 12.0–15.0)
Lymphocytes Relative: 38 % (ref 12–46)
Lymphs Abs: 0.9 10*3/uL (ref 0.7–4.0)
Monocytes Relative: 9 % (ref 3–12)
Neutro Abs: 1.2 10*3/uL — ABNORMAL LOW (ref 1.7–7.7)
Neutrophils Relative %: 49 % (ref 43–77)
Platelets: 177 10*3/uL (ref 150–400)
RBC: 3.55 MIL/uL — ABNORMAL LOW (ref 3.87–5.11)
WBC: 2.5 10*3/uL — ABNORMAL LOW (ref 4.0–10.5)

## 2011-02-09 LAB — LIPID PANEL
Cholesterol: 227 mg/dL — ABNORMAL HIGH (ref 0–200)
HDL: 57 mg/dL (ref >39)
Triglycerides: 68 mg/dL (ref <150)

## 2011-02-09 LAB — RPR

## 2011-02-22 ENCOUNTER — Encounter: Payer: Self-pay | Admitting: Internal Medicine

## 2011-02-22 ENCOUNTER — Ambulatory Visit (INDEPENDENT_AMBULATORY_CARE_PROVIDER_SITE_OTHER): Payer: Federal, State, Local not specified - PPO | Admitting: Internal Medicine

## 2011-02-22 VITALS — BP 112/72 | HR 83 | Temp 98.3°F | Ht 64.0 in | Wt 173.0 lb

## 2011-02-22 DIAGNOSIS — E881 Lipodystrophy, not elsewhere classified: Secondary | ICD-10-CM

## 2011-02-22 DIAGNOSIS — B20 Human immunodeficiency virus [HIV] disease: Secondary | ICD-10-CM

## 2011-02-22 DIAGNOSIS — G4700 Insomnia, unspecified: Secondary | ICD-10-CM

## 2011-02-22 MED ORDER — ZOLPIDEM TARTRATE 10 MG PO TABS: ORAL_TABLET | ORAL | Status: DC

## 2011-02-23 NOTE — Assessment & Plan Note (Signed)
I talked to her about the possibility of using injectable Egrifta but she is not interested in trying any more medications and particularly ones that have to be given by injection. I suggested that she can consider getting more regular and strenuous physical exercise as a way to control her weight and central adiposity.

## 2011-02-23 NOTE — Progress Notes (Signed)
  Subjective:    Patient ID: Theresa Morrison, female    DOB: 1955-10-27, 55 y.o.   MRN: [id number]  HPI Theresa Morrison is in for her routine visit today. She denies missing a single dose of her HIV medications. She is upset that she has not been able to lose weight. She states that she tries to watch what she eats but she does not get any regular exercise. She has been bothered by insomnia for the last 3 months. She usually gets in bed about midnight but has trouble falling asleep before 2 AM and generally wakes at 4 AM and cannot fall back to sleep. She has tried to cut out all caffeine but has not noticed any improvement. She generally does not have enough time during the day to take naps but has noted that she is occasionally falling asleep whenever she sits down. She does not believe that she has any problem with snoring. She states that she does not feel anxious or depressed. Upon further questioning I note that this problem began when her husband was to pull a first to his base in [place] and for the last month to [place]. She admits that she is worried about him and can only keep in touch through face broke. She states that her 2 teenage children are worried as well but don't speak much about it.    Review of Systems     Objective:   Physical Exam  Constitutional: No distress.       Her dramatic peripheral lipoatrophy and central adiposity maybe a little bit worse over the past few years.  HENT:  Mouth/Throat: Oropharynx is clear and moist. No oropharyngeal exudate.  Cardiovascular: Normal rate, regular rhythm and normal heart sounds.   No murmur heard. Pulmonary/Chest: Breath sounds normal. She has no wheezes. She has no rales.  Abdominal: She exhibits no mass. There is no tenderness.       Firm diffuse adiposity.  Psychiatric: She has a normal mood and affect.          Assessment & Plan:

## 2011-02-23 NOTE — Assessment & Plan Note (Signed)
Given the temporal relationship between the onset of severe insomnia and her husband's deployment I suspect that the two are related. I agreed to give her a prescription for Ambien and I will see her back in 2 weeks to see if this is better and to reassess her for any evidence of depression.

## 2011-02-23 NOTE — Assessment & Plan Note (Signed)
Theresa Morrison CD4 count is stable at 490 and her viral load remains undetectable at less than 20. I will continue her Truvada and Viramune.

## 2011-03-07 ENCOUNTER — Other Ambulatory Visit: Payer: Self-pay | Admitting: Licensed Clinical Social Worker

## 2011-03-07 DIAGNOSIS — B20 Human immunodeficiency virus [HIV] disease: Secondary | ICD-10-CM

## 2011-03-07 MED ORDER — NEVIRAPINE ER 400 MG PO TB24: 400.0000 mg | ORAL_TABLET | Freq: Every day | ORAL | Status: DC

## 2011-03-08 ENCOUNTER — Ambulatory Visit
Admission: RE | Admit: 2011-03-08 | Discharge: 2011-03-08 | Disposition: A | Discharge status: Home / Self Care | Payer: Federal, State, Local not specified - PPO | Source: Ambulatory Visit | Attending: Internal Medicine | Admitting: Internal Medicine

## 2011-03-08 ENCOUNTER — Ambulatory Visit (INDEPENDENT_AMBULATORY_CARE_PROVIDER_SITE_OTHER): Payer: Federal, State, Local not specified - PPO | Admitting: Internal Medicine

## 2011-03-08 ENCOUNTER — Encounter: Payer: Self-pay | Admitting: Internal Medicine

## 2011-03-08 VITALS — BP 111/71 | HR 65 | Temp 98.2°F | Ht 64.5 in | Wt 169.5 lb

## 2011-03-08 DIAGNOSIS — M25552 Pain in left hip: Secondary | ICD-10-CM

## 2011-03-08 DIAGNOSIS — M25559 Pain in unspecified hip: Secondary | ICD-10-CM

## 2011-03-08 DIAGNOSIS — Z23 Encounter for immunization: Secondary | ICD-10-CM

## 2011-03-08 DIAGNOSIS — G4700 Insomnia, unspecified: Secondary | ICD-10-CM

## 2011-03-08 DIAGNOSIS — B20 Human immunodeficiency virus [HIV] disease: Secondary | ICD-10-CM

## 2011-03-08 NOTE — Assessment & Plan Note (Signed)
Her infection remains under very good control. I will continue her current regimen

## 2011-03-08 NOTE — Assessment & Plan Note (Signed)
I will start evaluation with x-rays of her left hip to look for evidence of degenerative arthritis or avascular necrosis. She does not want to try taking other medication for her pain.

## 2011-03-08 NOTE — Progress Notes (Signed)
  Subjective:    Patient ID: Theresa Morrison, female    DOB: 15-May-1956, 55 y.o.   MRN: [id number]  HPI Teale is in for her two-week followup visit. She has used Ambien approximately 7 nights out of the last 14 and finds that it does help her fall asleep and stay asleep. She denies feeling depressed. She tells me that she failed to mention on her last visit that one reason she is not sleeping well is that 2 months ago she began to develop left hip pain and it is sometimes severe enough to keep her awake at night. She does not recall injuring her hip. She does not have any back pain. The pain is usually between 2 and a 5 on a scale of 1-10. She tried taking some acetaminophen on a few occasions without much relief. The pain is there all the time but will intermittently worse and when walking. However it can also worsen and bother her a great deal when she is laying in bed. She has not noted any swelling, redness, warmth or other changes over the left hip. She is not aware of any fever.  She has not missed a single dose of her medications.    Review of Systems     Objective:   Physical Exam  Constitutional: No distress.       Her dramatic peripheral lipoatrophy and central adiposity maybe a little bit worse over the past few years.  HENT:  Mouth/Throat: Oropharynx is clear and moist. No oropharyngeal exudate.  Cardiovascular: Normal rate, regular rhythm and normal heart sounds.   No murmur heard. Pulmonary/Chest: Breath sounds normal. She has no wheezes. She has no rales.  Abdominal: She exhibits no mass. There is no tenderness.       Firm diffuse adiposity.  Musculoskeletal:       She has good range of motion of her left hip without much pain. There is any swelling or any other sign of inflammation other than pain with direct palpation laterally over the hip.  Psychiatric: She has a normal mood and affect.          Assessment & Plan:

## 2011-03-08 NOTE — Assessment & Plan Note (Signed)
I suspect that her insomnia is due to multiple factors including the deployment of her husband to [place] recently and recent onset of left hip pain. She can continue to use the Ambien as needed while we sort out the cause of her pain.

## 2011-04-20 ENCOUNTER — Ambulatory Visit: Payer: Federal, State, Local not specified - PPO | Admitting: Internal Medicine

## 2011-05-05 LAB — T-HELPER CELL (CD4) - (RCID CLINIC ONLY): CD4 % Helper T Cell: 36

## 2011-05-11 ENCOUNTER — Encounter: Payer: Self-pay | Admitting: Internal Medicine

## 2011-05-11 ENCOUNTER — Ambulatory Visit (INDEPENDENT_AMBULATORY_CARE_PROVIDER_SITE_OTHER): Payer: Federal, State, Local not specified - PPO | Admitting: Internal Medicine

## 2011-05-11 VITALS — BP 109/73 | HR 79 | Temp 98.2°F | Ht 64.5 in | Wt 164.5 lb

## 2011-05-11 DIAGNOSIS — E785 Hyperlipidemia, unspecified: Secondary | ICD-10-CM

## 2011-05-11 DIAGNOSIS — B20 Human immunodeficiency virus [HIV] disease: Secondary | ICD-10-CM

## 2011-05-11 DIAGNOSIS — M25552 Pain in left hip: Secondary | ICD-10-CM

## 2011-05-11 DIAGNOSIS — M25559 Pain in unspecified hip: Secondary | ICD-10-CM

## 2011-05-11 NOTE — Assessment & Plan Note (Signed)
She had been making progress on her LDL cholesterol with dietary modification but has fallen off the wagon so to speak since her husband was deployed to [place]. She believes things will improve again when he returns home soon. I will repeat a lipid panel before her next visit in 3 months. Due to her prior history of stroke it is important that we get her LDL cholesterol to goal. She is aware that if she cannot do this through lifestyle modification we will need to consider statin therapy at her next visit.

## 2011-05-11 NOTE — Assessment & Plan Note (Signed)
Her intermittent left hip and low back pain is probably do to musculoskeletal strain. I have encouraged her to continue her stretching exercises and consider some core strengthening exercises.

## 2011-05-11 NOTE — Assessment & Plan Note (Signed)
Her infection remains under very good control. I will continue Truvada and Viramune.

## 2011-05-11 NOTE — Progress Notes (Signed)
  Subjective:    Patient ID: Theresa Morrison, female    DOB: [birth year], 55 y.o.   MRN: [id number]  HPI Theresa Morrison is in for her routine visit. Her low back and the left side he pain is about the same. She is now seeing a pattern where it will get worse after she sits on the floor for more than an hour or if she has been trying to pull kudzu up out of the yard. She is also found that she does some simple stretching exercises in her bed the pain will improve. The pain is not particularly limiting to her activity.  She is found that she is back to sleep been well and does not need to take Ambien. Her husband will return to [place] following his deployment in [place] and about 8 days. He will be in [place] for a short period of time and then will be returning home. They have already planned a vacation to [place] after he returns.  She has found that her routine meds have been out of whack since he left. Specifically she noted that she had been doing much better trying to eat a healthy diet before he left and has now found herself snacking on the less healthy food.    Review of Systems     Objective:   Physical Exam  Constitutional: No distress.       There is no change in her lipodystrophy and striking central adiposity.  HENT:  Mouth/Throat: Oropharynx is clear and moist. No oropharyngeal exudate.  Cardiovascular: Normal rate, regular rhythm and normal heart sounds.   No murmur heard. Pulmonary/Chest: Breath sounds normal. She has no wheezes. She has no rales.  Abdominal: Soft. Bowel sounds are normal. She exhibits no mass. There is no tenderness.  Skin: No rash noted.  Psychiatric: She has a normal mood and affect.          Assessment & Plan:

## 2011-05-31 ENCOUNTER — Other Ambulatory Visit: Payer: Self-pay | Admitting: Internal Medicine

## 2011-05-31 DIAGNOSIS — Z8673 Personal history of transient ischemic attack (TIA), and cerebral infarction without residual deficits: Secondary | ICD-10-CM

## 2011-12-08 ENCOUNTER — Other Ambulatory Visit: Payer: Self-pay | Admitting: Licensed Clinical Social Worker

## 2011-12-08 DIAGNOSIS — B20 Human immunodeficiency virus [HIV] disease: Secondary | ICD-10-CM

## 2011-12-08 MED ORDER — NEVIRAPINE ER 400 MG PO TB24: 400.0000 mg | ORAL_TABLET | Freq: Every day | ORAL | Status: DC

## 2011-12-20 ENCOUNTER — Other Ambulatory Visit: Payer: Federal, State, Local not specified - PPO

## 2011-12-20 DIAGNOSIS — B20 Human immunodeficiency virus [HIV] disease: Secondary | ICD-10-CM

## 2011-12-20 LAB — COMPLETE METABOLIC PANEL WITH GFR
ALT: 15 U/L (ref 0–35)
AST: 20 U/L (ref 0–37)
CO2: 28 mEq/L (ref 19–32)
Calcium: 9.2 mg/dL (ref 8.4–10.5)
Chloride: 109 mEq/L (ref 96–112)
GFR, Est African American: 63 mL/min
Sodium: 144 mEq/L (ref 135–145)
Total Protein: 6.2 g/dL (ref 6.0–8.3)

## 2011-12-20 LAB — LIPID PANEL
HDL: 50 mg/dL (ref >39)
LDL Cholesterol: 171 mg/dL — ABNORMAL HIGH (ref 0–99)
VLDL: 21 mg/dL (ref 0–40)

## 2011-12-20 LAB — CBC
Platelets: 195 10*3/uL (ref 150–400)
RBC: 3.78 MIL/uL — ABNORMAL LOW (ref 3.87–5.11)
WBC: 2.3 10*3/uL — ABNORMAL LOW (ref 4.0–10.5)

## 2011-12-20 LAB — RPR

## 2011-12-22 LAB — HIV-1 RNA QUANT-NO REFLEX-BLD
HIV 1 RNA Quant: <20 copies/mL (ref <20)
HIV-1 RNA Quant, Log: <1.3 {Log} (ref <1.30)

## 2012-01-08 ENCOUNTER — Encounter: Payer: Self-pay | Admitting: *Deleted

## 2012-01-08 ENCOUNTER — Telehealth: Payer: Self-pay | Admitting: *Deleted

## 2012-01-08 NOTE — Telephone Encounter (Signed)
Appointment reminder

## 2012-01-08 NOTE — Telephone Encounter (Signed)
Error

## 2012-01-09 ENCOUNTER — Ambulatory Visit (INDEPENDENT_AMBULATORY_CARE_PROVIDER_SITE_OTHER): Payer: Federal, State, Local not specified - PPO | Admitting: Internal Medicine

## 2012-01-09 ENCOUNTER — Encounter: Payer: Self-pay | Admitting: Internal Medicine

## 2012-01-09 VITALS — BP 116/76 | HR 73 | Temp 98.4°F | Ht 64.5 in | Wt 174.0 lb

## 2012-01-09 DIAGNOSIS — N289 Disorder of kidney and ureter, unspecified: Secondary | ICD-10-CM

## 2012-01-09 DIAGNOSIS — B20 Human immunodeficiency virus [HIV] disease: Secondary | ICD-10-CM
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
## 2012-01-15 ENCOUNTER — Other Ambulatory Visit: Payer: Self-pay | Admitting: *Deleted

## 2012-01-15 DIAGNOSIS — B20 Human immunodeficiency virus [HIV] disease: Secondary | ICD-10-CM

## 2012-01-15 MED ORDER — EMTRICITABINE-TENOFOVIR DF 200-300 MG PO TABS: 1.0000 | ORAL_TABLET | Freq: Every day | ORAL | Status: DC

## 2012-02-06 ENCOUNTER — Telehealth: Payer: Self-pay | Admitting: Licensed Clinical Social Worker

## 2012-02-06 NOTE — Telephone Encounter (Signed)
Patient walked in today because wanting advice on starting vitamin e for macular degeneration recommended by her eye doctor. Her eye doctor wanted the patient to check if was ok to start vitamin e in conjunction with plavix and asa 81 mg. I faxed the sheet with the information to Samule Ohm to give to Dr. Orvan Falconer because he is not scheduled to be in the clinic the rest of the week.

## 2012-02-09 NOTE — Telephone Encounter (Signed)
The data on interactions between vitamin E and antiplatelet agents is inconsistent. Please tell Theresa Morrison that I think it's okay for her to go ahead and try taking vitamin E supplementation.

## 2012-06-10 ENCOUNTER — Other Ambulatory Visit (INDEPENDENT_AMBULATORY_CARE_PROVIDER_SITE_OTHER): Payer: Federal, State, Local not specified - PPO

## 2012-06-10 DIAGNOSIS — B20 Human immunodeficiency virus [HIV] disease: Secondary | ICD-10-CM

## 2012-06-10 LAB — COMPREHENSIVE METABOLIC PANEL
Alkaline Phosphatase: 170 U/L — ABNORMAL HIGH (ref 39–117)
BUN: 20 mg/dL (ref 6–23)
CO2: 28 mEq/L (ref 19–32)
Creat: 1.22 mg/dL — ABNORMAL HIGH (ref 0.50–1.10)
Glucose, Bld: 80 mg/dL (ref 70–99)
Sodium: 143 mEq/L (ref 135–145)
Total Bilirubin: 0.4 mg/dL (ref 0.3–1.2)
Total Protein: 6.4 g/dL (ref 6.0–8.3)

## 2012-06-10 LAB — CBC
Hemoglobin: 12.9 g/dL (ref 12.0–15.0)
MCH: 34.1 pg — ABNORMAL HIGH (ref 26.0–34.0)
MCHC: 36 g/dL (ref 30.0–36.0)
MCV: 94.7 fL (ref 78.0–100.0)

## 2012-06-10 LAB — LIPID PANEL
Cholesterol: 235 mg/dL — ABNORMAL HIGH (ref 0–200)
HDL: 61 mg/dL (ref >39)
LDL Cholesterol: 161 mg/dL — ABNORMAL HIGH (ref 0–99)
Total CHOL/HDL Ratio: 3.9 Ratio
Triglycerides: 65 mg/dL (ref <150)
VLDL: 13 mg/dL (ref 0–40)

## 2012-06-11 LAB — HIV-1 RNA QUANT-NO REFLEX-BLD: HIV 1 RNA Quant: <20 copies/mL (ref <20)

## 2012-06-27 ENCOUNTER — Other Ambulatory Visit: Payer: Federal, State, Local not specified - PPO

## 2012-07-02 ENCOUNTER — Encounter: Payer: Self-pay | Admitting: Internal Medicine

## 2012-07-02 ENCOUNTER — Ambulatory Visit (INDEPENDENT_AMBULATORY_CARE_PROVIDER_SITE_OTHER): Payer: Federal, State, Local not specified - PPO | Admitting: Internal Medicine

## 2012-07-02 VITALS — BP 114/74 | HR 60 | Temp 97.8°F | Ht 64.5 in | Wt 171.8 lb

## 2012-07-02 DIAGNOSIS — Z23 Encounter for immunization: Secondary | ICD-10-CM

## 2012-07-02 DIAGNOSIS — B20 Human immunodeficiency virus [HIV] disease: Secondary | ICD-10-CM

## 2012-07-02 NOTE — Progress Notes (Signed)
Patient ID: Theresa Morrison, female   DOB: [birth year], 56 y.o.   MRN: [id number]     [facility] for Infectious Disease  Patient Active Problem List  Diagnosis  . HIV DISEASE  . HERPES ZOSTER  . HSV  . CHLAMYDIA TRACHOMATIS, LOWER GU  . LIPODYSTROPHY  . PERIPHERAL NEUROPATHY  . RECTAL BLEEDING  . SYMPTOM, ABNORMAL LOSS OF WEIGHT  . CEREBROVASCULAR ACCIDENT, HX OF  . ARTHROSCOPY, KNEE, HX OF  . ROTATOR CUFF SYNDROME, RIGHT  . Hip pain, left  . Dyslipidemia  . Renal insufficiency    Patient's Medications  New Prescriptions   No medications on file  Previous Medications   ASPIRIN 81 MG TABLET    Take 81 mg by mouth daily.     CALCIUM CARBONATE (OS-CAL) 600 MG TABS    Take 600 mg by mouth daily.     CLOPIDOGREL (PLAVIX) 75 MG TABLET    TAKE 1 TABLET DAILY   EMTRICITABINE-TENOFOVIR (TRUVADA) 200-300 MG PER TABLET    Take 1 tablet by mouth daily.   MULTIPLE VITAMINS-MINERALS (MULTIVITAMIN WITH MINERALS) TABLET    Take 1 tablet by mouth daily.    NEVIRAPINE (VIRAMUNE XR) 400 MG TB24    Take 400 mg by mouth daily.   OMEGA-3 FATTY ACIDS (FISH OIL) 1000 MG CAPS    Take by mouth daily.   ZOLPIDEM (AMBIEN) 10 MG TABLET    Take 1/2-1 tablet at bedtime as needed for trouble sleeping  Modified Medications   No medications on file  Discontinued Medications   No medications on file    Subjective: Theresa Morrison is in for her routine visit. She has not missed any of her medications and she states that she is feeling well without any new difficulties. She states that things are doing better with her husband at home but does not want to elaborate on this.  Objective: Temp: 97.8 F (36.6 C) (12/10 1008) Temp src: Oral (12/10 1008) BP: 114/74 mmHg (12/10 1008) Pulse Rate: 60  (12/10 1008)  General: Somewhat flat affect. Her body mass index is 29 and she has chronic, stable central adiposity Skin: No rash Oral: No oropharyngeal lesions Lungs: Clear Cor: Regular S1 and S2 with no  murmurs Abdomen: Obese, soft and nontender  Lab Results HIV 1 RNA Quant (copies/mL)  Date Value  06/10/2012 <20   12/20/2011 <20   02/08/2011 <20      CD4 T Cell Abs (cmm)  Date Value  06/10/2012 350*  12/20/2011 360*  02/08/2011 490      Assessment: Her HIV infection remains under excellent control.  Plan: 1. Continue current antiretroviral regimen 2. Influenza vaccination today 3. Followup after blood work in 6 months   Cliffton Asters, MD [facility] for Infectious Disease [facility] [phone] pager   [phone] cell 07/02/2012, 10:32 AM

## 2012-07-11 ENCOUNTER — Ambulatory Visit: Payer: Federal, State, Local not specified - PPO | Admitting: Internal Medicine

## 2012-07-30 ENCOUNTER — Other Ambulatory Visit: Payer: Self-pay | Admitting: Internal Medicine

## 2012-09-23 ENCOUNTER — Other Ambulatory Visit: Payer: Self-pay | Admitting: Internal Medicine

## 2012-09-23 DIAGNOSIS — R748 Abnormal levels of other serum enzymes: Secondary | ICD-10-CM

## 2012-09-23 DIAGNOSIS — Z8673 Personal history of transient ischemic attack (TIA), and cerebral infarction without residual deficits: Secondary | ICD-10-CM

## 2012-09-24 ENCOUNTER — Encounter: Payer: Self-pay | Admitting: Internal Medicine

## 2012-09-27 ENCOUNTER — Other Ambulatory Visit: Payer: Federal, State, Local not specified - PPO

## 2012-10-04 ENCOUNTER — Ambulatory Visit
Admission: RE | Admit: 2012-10-04 | Discharge: 2012-10-04 | Disposition: A | Discharge status: Home / Self Care | Payer: Federal, State, Local not specified - PPO | Source: Ambulatory Visit | Attending: Internal Medicine | Admitting: Internal Medicine

## 2012-10-04 DIAGNOSIS — R748 Abnormal levels of other serum enzymes: Secondary | ICD-10-CM

## 2012-10-04 DIAGNOSIS — Z8673 Personal history of transient ischemic attack (TIA), and cerebral infarction without residual deficits: Secondary | ICD-10-CM

## 2012-10-04 DIAGNOSIS — Z21 Asymptomatic human immunodeficiency virus [HIV] infection status: Secondary | ICD-10-CM | POA: Insufficient documentation

## 2012-10-04 HISTORY — DX: Asymptomatic human immunodeficiency virus (hiv) infection status: Z21

## 2012-10-17 ENCOUNTER — Other Ambulatory Visit: Payer: Self-pay | Admitting: Internal Medicine

## 2012-10-17 DIAGNOSIS — E041 Nontoxic single thyroid nodule: Secondary | ICD-10-CM

## 2012-10-23 ENCOUNTER — Ambulatory Visit
Admission: RE | Admit: 2012-10-23 | Discharge: 2012-10-23 | Disposition: A | Discharge status: Home / Self Care | Payer: Federal, State, Local not specified - PPO | Source: Ambulatory Visit | Attending: Internal Medicine | Admitting: Internal Medicine

## 2012-10-23 DIAGNOSIS — E041 Nontoxic single thyroid nodule: Secondary | ICD-10-CM

## 2012-10-24 ENCOUNTER — Encounter: Payer: Self-pay | Admitting: Internal Medicine

## 2012-10-24 ENCOUNTER — Telehealth: Payer: Self-pay | Admitting: Internal Medicine

## 2012-10-24 ENCOUNTER — Other Ambulatory Visit: Payer: Self-pay | Admitting: Neurology

## 2012-10-24 ENCOUNTER — Ambulatory Visit (INDEPENDENT_AMBULATORY_CARE_PROVIDER_SITE_OTHER): Payer: Federal, State, Local not specified - PPO | Admitting: Internal Medicine

## 2012-10-24 ENCOUNTER — Other Ambulatory Visit (INDEPENDENT_AMBULATORY_CARE_PROVIDER_SITE_OTHER): Payer: Federal, State, Local not specified - PPO

## 2012-10-24 ENCOUNTER — Other Ambulatory Visit: Payer: Self-pay | Admitting: Internal Medicine

## 2012-10-24 VITALS — BP 108/70 | HR 72 | Ht 64.0 in | Wt 172.4 lb

## 2012-10-24 DIAGNOSIS — E041 Nontoxic single thyroid nodule: Secondary | ICD-10-CM

## 2012-10-24 DIAGNOSIS — R7401 Elevation of levels of liver transaminase levels: Secondary | ICD-10-CM

## 2012-10-24 DIAGNOSIS — Z86010 Personal history of colonic polyps: Secondary | ICD-10-CM

## 2012-10-24 LAB — GAMMA GT: GGT: 27 U/L (ref 7–51)

## 2012-10-24 NOTE — Telephone Encounter (Signed)
Left message for pt to call back  °

## 2012-10-24 NOTE — Progress Notes (Signed)
HISTORY OF PRESENT ILLNESS:  Theresa Morrison is a 57 y.o. female with HIV disease, prior CVA for which she is on Plavix, lipodystrophy, and peripheral neuropathy. The patient is sent today regarding an elevated alkaline phosphatase. I last saw her 07/22/2010 when she underwent routine screening colonoscopy. She was found to have for diminutive colon polyps which were removed and found to be adenomatous. The colon was otherwise normal with internal hemorrhoids noted. Followup in 5 years recommended. Reviewing the patient's outside blood work, she had an alkaline phosphatase of 181 and May of 2013 and 170 in November of 2013. Other liver tests were entirely normal. She is chronically leukopenic. Patient denies any GI complaints including right upper quadrant abdominal pain, jaundice, or fevers. She denies bone disease. Of importance, the alkaline phosphatase isoenzymes were obtained 09/17/2012. Alkaline phosphatase was 196 (117 upper limits of normal). Liver fraction was 33% (26-86%), bone fraction was 63% (11-68%), and intestinal fraction 4%. Also, abdominal ultrasound obtained 10/04/2012 was normal. No ductal dilation. Normal liver.  REVIEW OF SYSTEMS:  All non-GI ROS negative upon review of all systems  Past Medical History  Diagnosis Date  . HIV positive 1987  . Colon polyps     adenomatous  . CVA (cerebral vascular accident)   . Cushing's syndrome   . IBS (irritable bowel syndrome)   . Shingles   . Toxoplasmosis   . Hypercholesterolemia     Past Surgical History  Procedure Laterality Date  . Tonsillectomy    . Appendectomy  2001  . Knee arthroscopy Bilateral   . Shoulder surgery Right     Social History Theresa Morrison  reports that she has never smoked. She has never used smokeless tobacco. She reports that she does not drink alcohol or use illicit drugs.  family history includes Breast cancer in her mother and Heart attack in her father.  Allergies  Allergen Reactions   . Penicillins     REACTION: facial swelling  . Stavudine     REACTION: peripheral neuropathy  . Sulfamethoxazole W-Trimethoprim     REACTION: hives       PHYSICAL EXAMINATION: Vital signs: BP 108/70  Pulse 72  Ht 5\' 4"  (1.626 m)  Wt 172 lb 6 oz (78.189 kg)  BMI 29.57 kg/m2  Constitutional: generally well-appearing, no acute distress Psychiatric: alert and oriented x3, cooperative Eyes: extraocular movements intact, anicteric, conjunctiva pink Mouth: oral pharynx moist, no lesions Neck: supple no lymphadenopathy Cardiovascular: heart regular rate and rhythm, no murmur Lungs: clear to auscultation bilaterally Abdomen: soft, nontender, nondistended, no obvious ascites, no peritoneal signs, normal bowel sounds, no organomegaly Extremities: no lower extremity edema bilaterally Skin: no lesions on visible extremities Neuro: No focal deficits. No asterixis.  ASSESSMENT:  #1. Elevation of alkaline phosphatase. Reviewing the isoenzymes, this is unlikely to be liver in origin. I suspect bone origin. #2. History of adenomatous colon polyps December 2011. Due for surveillance colonoscopy around December 2016  PLAN:  #1. Gamma GGT and 5'-nucleotidase to rule out liver origin. AMA to rule out PBC if other labs abnormal. If laboratories do not support liver origin, then returned to Dr. Renne Crigler to evaluate for possible bone source for alkaline phosphatase elevation  ADDENDUM 5'-nucleotidase has returned normal. Alkaline phosphatase non-liver origin. Return to Dr. Renne Crigler

## 2012-10-24 NOTE — Patient Instructions (Addendum)
Your physician has requested that you go to the basement for  lab work before leaving today.   

## 2012-10-25 ENCOUNTER — Other Ambulatory Visit: Payer: Self-pay | Admitting: Internal Medicine

## 2012-10-25 DIAGNOSIS — R7989 Other specified abnormal findings of blood chemistry: Secondary | ICD-10-CM

## 2012-10-25 LAB — ANA: Anti Nuclear Antibody(ANA): NEGATIVE

## 2012-10-28 LAB — MITOCHONDRIAL ANTIBODIES: Mitochondrial M2 Ab, IgG: 0.1 (ref <0.91)

## 2012-10-28 LAB — ANTI-SMOOTH MUSCLE ANTIBODY, IGG: Smooth Muscle Ab: 2 U (ref <20)

## 2012-10-29 NOTE — Telephone Encounter (Signed)
Noted  

## 2012-10-30 ENCOUNTER — Ambulatory Visit
Admission: RE | Admit: 2012-10-30 | Discharge: 2012-10-30 | Disposition: A | Discharge status: Home / Self Care | Payer: Federal, State, Local not specified - PPO | Source: Ambulatory Visit | Attending: Internal Medicine | Admitting: Internal Medicine

## 2012-10-30 ENCOUNTER — Other Ambulatory Visit (HOSPITAL_COMMUNITY)
Admission: RE | Admit: 2012-10-30 | Discharge: 2012-10-30 | Disposition: A | Discharge status: Home / Self Care | Payer: Federal, State, Local not specified - PPO | Source: Ambulatory Visit | Attending: Interventional Radiology | Admitting: Interventional Radiology

## 2012-10-30 DIAGNOSIS — E041 Nontoxic single thyroid nodule: Secondary | ICD-10-CM

## 2012-10-30 DIAGNOSIS — E049 Nontoxic goiter, unspecified: Secondary | ICD-10-CM | POA: Insufficient documentation

## 2012-11-04 ENCOUNTER — Other Ambulatory Visit: Payer: Self-pay | Admitting: Obstetrics and Gynecology

## 2012-11-04 DIAGNOSIS — R928 Other abnormal and inconclusive findings on diagnostic imaging of breast: Secondary | ICD-10-CM

## 2012-11-18 ENCOUNTER — Ambulatory Visit
Admission: RE | Admit: 2012-11-18 | Discharge: 2012-11-18 | Disposition: A | Discharge status: Home / Self Care | Payer: Federal, State, Local not specified - PPO | Source: Ambulatory Visit | Attending: Obstetrics and Gynecology | Admitting: Obstetrics and Gynecology

## 2012-11-18 DIAGNOSIS — R928 Other abnormal and inconclusive findings on diagnostic imaging of breast: Secondary | ICD-10-CM

## 2012-12-10 ENCOUNTER — Other Ambulatory Visit: Payer: Self-pay | Admitting: *Deleted

## 2012-12-10 DIAGNOSIS — B20 Human immunodeficiency virus [HIV] disease: Secondary | ICD-10-CM

## 2012-12-10 MED ORDER — EMTRICITABINE-TENOFOVIR DF 200-300 MG PO TABS: 1.0000 | ORAL_TABLET | Freq: Every day | ORAL | Status: DC

## 2012-12-10 MED ORDER — NEVIRAPINE ER 400 MG PO TB24: 400.0000 mg | ORAL_TABLET | Freq: Every day | ORAL | Status: DC

## 2012-12-31 ENCOUNTER — Other Ambulatory Visit (INDEPENDENT_AMBULATORY_CARE_PROVIDER_SITE_OTHER): Payer: Federal, State, Local not specified - PPO

## 2012-12-31 DIAGNOSIS — B20 Human immunodeficiency virus [HIV] disease: Secondary | ICD-10-CM

## 2012-12-31 LAB — COMPREHENSIVE METABOLIC PANEL
Alkaline Phosphatase: 224 U/L — ABNORMAL HIGH (ref 39–117)
Creat: 1.24 mg/dL — ABNORMAL HIGH (ref 0.50–1.10)
Glucose, Bld: 76 mg/dL (ref 70–99)
Sodium: 142 mEq/L (ref 135–145)
Total Bilirubin: 0.5 mg/dL (ref 0.3–1.2)
Total Protein: 6.4 g/dL (ref 6.0–8.3)

## 2012-12-31 LAB — CBC
Hemoglobin: 12.8 g/dL (ref 12.0–15.0)
MCH: 32.1 pg (ref 26.0–34.0)
MCHC: 34.1 g/dL (ref 30.0–36.0)

## 2012-12-31 LAB — RPR

## 2012-12-31 LAB — LIPID PANEL: HDL: 47 mg/dL (ref >39)

## 2013-01-01 LAB — T-HELPER CELL (CD4) - (RCID CLINIC ONLY)
CD4 % Helper T Cell: 44 % (ref 33–55)
CD4 T Cell Abs: 360 uL — ABNORMAL LOW (ref 400–2700)

## 2013-01-01 LAB — HIV-1 RNA QUANT-NO REFLEX-BLD: HIV 1 RNA Quant: <20 copies/mL (ref <20)

## 2013-01-14 ENCOUNTER — Encounter: Payer: Self-pay | Admitting: Internal Medicine

## 2013-01-14 ENCOUNTER — Other Ambulatory Visit: Payer: Self-pay | Admitting: *Deleted

## 2013-01-14 ENCOUNTER — Ambulatory Visit (INDEPENDENT_AMBULATORY_CARE_PROVIDER_SITE_OTHER): Payer: Federal, State, Local not specified - PPO | Admitting: Internal Medicine

## 2013-01-14 VITALS — BP 113/71 | HR 68 | Temp 98.2°F | Ht 64.25 in | Wt 173.2 lb

## 2013-01-14 DIAGNOSIS — E041 Nontoxic single thyroid nodule: Secondary | ICD-10-CM

## 2013-01-14 DIAGNOSIS — R748 Abnormal levels of other serum enzymes: Secondary | ICD-10-CM | POA: Insufficient documentation

## 2013-01-14 DIAGNOSIS — G4700 Insomnia, unspecified: Secondary | ICD-10-CM

## 2013-01-14 DIAGNOSIS — B20 Human immunodeficiency virus [HIV] disease: Secondary | ICD-10-CM

## 2013-01-14 MED ORDER — ZOLPIDEM TARTRATE 10 MG PO TABS: ORAL_TABLET | ORAL | Status: DC

## 2013-01-14 NOTE — Progress Notes (Signed)
Patient ID: Theresa Morrison, female   DOB: 08/10/1955, 57 y.o.   MRN: [id number]          [facility] for Infectious Disease  Patient Active Problem List   Diagnosis Date Noted  . Renal insufficiency 01/09/2012    Priority: High  . Dyslipidemia 05/11/2011    Priority: High  . HIV DISEASE 10/11/2006    Priority: High  . CEREBROVASCULAR ACCIDENT, HX OF 10/02/2006    Priority: High  . 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 Elevated alkaline phosphatase level 01/14/2013  . Hip pain, left 03/08/2011  . ROTATOR CUFF SYNDROME, RIGHT 07/12/2010  . 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 RECTAL BLEEDING 10/11/2006  . SYMPTOM, ABNORMAL LOSS OF WEIGHT 10/11/2006  . 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 LIPODYSTROPHY 10/02/2006  . PERIPHERAL NEUROPATHY 10/02/2006  . ARTHROSCOPY, KNEE, HX OF 10/02/2006    Patient's Medications  New Prescriptions   No medications on file  Previous Medications   ASPIRIN 81 MG TABLET    Take 81 mg by mouth daily.     ATORVASTATIN (LIPITOR) 10 MG TABLET    Take 10 mg by mouth daily.   CALCIUM CARBONATE (OS-CAL) 600 MG TABS    Take 600 mg by mouth daily.     CLOPIDOGREL (PLAVIX) 75 MG TABLET    TAKE 1 TABLET DAILY   EMTRICITABINE-TENOFOVIR (TRUVADA) 200-300 MG PER TABLET    Take 1 tablet by mouth daily.   NEVIRAPINE (VIRAMUNE XR) 400 MG TB24    Take 400 mg by mouth daily.   OMEGA-3 FATTY ACIDS (FISH OIL) 1000 MG CAPS    Take by mouth daily.  Modified Medications   Modified Medication Previous Medication   ZOLPIDEM (AMBIEN) 10 MG TABLET zolpidem (AMBIEN) 10 MG tablet      Take 1/2-1 tablet at bedtime as needed for trouble sleeping    Take 1/2-1 tablet at bedtime as needed for trouble sleeping  Discontinued Medications   No medications on file    Subjective: [name] is in for her routine visit. She has not missed any doses of her Truvada or Viramune since her last visit. 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 She's been trying to go to bed earlier so she can get to work at 7 AM and has had trouble falling asleep recently. She says her mood has been good. She sometimes forgets to take her morning ABS present Lipitor she is rushing to get to work. She does not miss her evening medications.  She recently had a benign cystic thyroid nodule aspirated. His also been evaluated for elevated alkaline phosphatase of bone origin.   Review of Systems: Pertinent items are noted in HPI.  Past Medical History  Diagnosis Date  . HIV positive 1987  . Colon polyps     adenomatous  . CVA (cerebral vascular accident)   . IBS (irritable bowel syndrome)   . Shingles   . Toxoplasmosis   . Hypercholesterolemia     History  Substance Use Topics  . Smoking status: Never Smoker   . Smokeless tobacco: Never Used  . Alcohol Use: No    Family History  Problem Relation Age of Onset  . Heart attack Father   . Breast cancer Mother     mets to lung and brain    Allergies  Allergen Reactions  . Penicillins     REACTION: facial swelling  . Stavudine  REACTION: peripheral neuropathy  . Sulfamethoxazole W-Trimethoprim     REACTION: hives    Objective: Temp: 98.2 F (36.8 C) (06/24 0845) Temp src: Oral (06/24 0845) BP: 113/71 mmHg (06/24 0845) Pulse Rate: 68 (06/24 0845)  General:  she is in good spirits  Skin:  no rash  Lungs: Clear  Cor:  regular S1 and S2 no murmurs  Abdomen:  marked central adiposity is unchanged Joints and extremities: peripheral lipoatrophy is unchanged   mood and affect: Normal   Lab Results HIV 1 RNA Quant (copies/mL)  Date Value  12/31/2012 <20   06/10/2012 <20   12/20/2011 <20      CD4 T Cell Abs (cmm)  Date Value  12/31/2012 360*  06/10/2012 350*  12/20/2011 360*     Lab Results  Component Value Date   WBC 2.5* 12/31/2012   HGB 12.8 12/31/2012   HCT 37.5 12/31/2012   MCV 94.0 12/31/2012   PLT 168 12/31/2012   BMET      Component Value Date/Time   NA 142 12/31/2012 0905   K 4.0 12/31/2012 0905   CL 107 12/31/2012 0905   CO2 26 12/31/2012 0905   GLUCOSE 76 12/31/2012 0905   BUN 11 12/31/2012 0905   CREATININE 1.24* 12/31/2012 0905   CREATININE 1.01 11/23/2009 0517   CALCIUM 9.2 12/31/2012 0905   Lab Results  Component Value Date   ALT 17 12/31/2012   AST 19 12/31/2012   ALKPHOS 224* 12/31/2012   BILITOT 0.5 12/31/2012   Lab Results  Component Value Date   CHOL 176 12/31/2012   HDL 47 12/31/2012   LDLCALC 112* 12/31/2012   TRIG 83 12/31/2012   CHOLHDL 3.7 12/31/2012   Assessment: Her HIV infection remains under excellent control. She has had a suppressed viral load for over 20 years. His mild renal insufficiency which is stable. She does have the option of switching to one pill a day regimen but will continue her current regimen for now. Encouraged her to consider taking her Lipitor and baby aspirin it so she does not forget. She does not want to go back to using a pillbox because she forgets to fill it out each week.   Plan: 1.  continue Truvada and Viramune  2.  followup after lab work in 6 months    Cliffton Asters, MD [facility] for Infectious Disease [facility] [phone] pager   [phone] cell 01/14/2013, 9:02 AM

## 2013-01-21 NOTE — Addendum Note (Signed)
Addended by: Jennet Maduro D on: 01/21/2013 08:38 AM   Modules accepted: Orders

## 2013-02-03 ENCOUNTER — Other Ambulatory Visit: Payer: Self-pay | Admitting: Licensed Clinical Social Worker

## 2013-02-03 ENCOUNTER — Other Ambulatory Visit: Payer: Self-pay | Admitting: *Deleted

## 2013-02-03 DIAGNOSIS — B20 Human immunodeficiency virus [HIV] disease: Secondary | ICD-10-CM

## 2013-02-03 MED ORDER — EMTRICITABINE-TENOFOVIR DF 200-300 MG PO TABS: 1.0000 | ORAL_TABLET | Freq: Every day | ORAL | Status: DC

## 2013-02-05 ENCOUNTER — Other Ambulatory Visit: Payer: Self-pay | Admitting: *Deleted

## 2013-02-05 NOTE — Telephone Encounter (Signed)
Spoke with [facility].  Both HIV medications are up-to-date with refills.

## 2013-03-07 ENCOUNTER — Other Ambulatory Visit: Payer: Self-pay | Admitting: *Deleted

## 2013-03-07 DIAGNOSIS — B20 Human immunodeficiency virus [HIV] disease: Secondary | ICD-10-CM

## 2013-03-07 MED ORDER — NEVIRAPINE ER 400 MG PO TB24: 400.0000 mg | ORAL_TABLET | Freq: Every day | ORAL | Status: DC

## 2013-07-01 ENCOUNTER — Ambulatory Visit (INDEPENDENT_AMBULATORY_CARE_PROVIDER_SITE_OTHER): Payer: Federal, State, Local not specified - PPO | Admitting: Licensed Clinical Social Worker

## 2013-07-01 ENCOUNTER — Other Ambulatory Visit: Payer: Federal, State, Local not specified - PPO

## 2013-07-01 DIAGNOSIS — B20 Human immunodeficiency virus [HIV] disease: Secondary | ICD-10-CM

## 2013-07-01 DIAGNOSIS — Z23 Encounter for immunization: Secondary | ICD-10-CM

## 2013-07-01 DIAGNOSIS — R748 Abnormal levels of other serum enzymes: Secondary | ICD-10-CM

## 2013-07-01 LAB — COMPREHENSIVE METABOLIC PANEL
ALT: 14 U/L (ref 0–35)
AST: 17 U/L (ref 0–37)
Chloride: 107 mEq/L (ref 96–112)
Creat: 1.1 mg/dL (ref 0.50–1.10)
Sodium: 137 mEq/L (ref 135–145)
Total Bilirubin: 0.5 mg/dL (ref 0.3–1.2)

## 2013-07-01 LAB — CBC
Platelets: 165 10*3/uL (ref 150–400)
RDW: 12.9 % (ref 11.5–15.5)
WBC: 3.7 10*3/uL — ABNORMAL LOW (ref 4.0–10.5)

## 2013-07-01 LAB — LIPID PANEL
HDL: 57 mg/dL (ref >39)
LDL Cholesterol: 99 mg/dL (ref 0–99)
Total CHOL/HDL Ratio: 3 Ratio

## 2013-07-02 LAB — HIV-1 RNA QUANT-NO REFLEX-BLD
HIV 1 RNA Quant: <20 copies/mL (ref <20)
HIV-1 RNA Quant, Log: <1.3 {Log} (ref <1.30)

## 2013-07-02 LAB — T-HELPER CELL (CD4) - (RCID CLINIC ONLY): CD4 T Cell Abs: 350 /uL — ABNORMAL LOW (ref 400–2700)

## 2013-07-15 ENCOUNTER — Ambulatory Visit (INDEPENDENT_AMBULATORY_CARE_PROVIDER_SITE_OTHER): Payer: Federal, State, Local not specified - PPO | Admitting: Internal Medicine

## 2013-07-15 VITALS — BP 134/75 | HR 72 | Temp 98.0°F | Ht 64.0 in | Wt 174.5 lb

## 2013-07-15 DIAGNOSIS — B20 Human immunodeficiency virus [HIV] disease: Secondary | ICD-10-CM

## 2013-07-15 NOTE — Progress Notes (Signed)
Patient ID: Theresa Morrison, female   DOB: Apr 21, 1956, 57 y.o.   MRN: [id number]          [facility] for Infectious Disease  Patient Active Problem List   Diagnosis Date Noted  . Renal insufficiency 01/09/2012    Priority: High  . Dyslipidemia 05/11/2011    Priority: High  . HIV DISEASE 10/11/2006    Priority: High  . CEREBROVASCULAR ACCIDENT, HX OF 10/02/2006    Priority: High  . 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 Elevated alkaline phosphatase level 01/14/2013  . Hip pain, left 03/08/2011  . ROTATOR CUFF SYNDROME, RIGHT 07/12/2010  . 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 RECTAL BLEEDING 10/11/2006  . SYMPTOM, ABNORMAL LOSS OF WEIGHT 10/11/2006  . 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 LIPODYSTROPHY 10/02/2006  . PERIPHERAL NEUROPATHY 10/02/2006  . ARTHROSCOPY, KNEE, HX OF 10/02/2006    Patient's Medications  New Prescriptions   No medications on file  Previous Medications   ASPIRIN 81 MG TABLET    Take 81 mg by mouth daily.     ATORVASTATIN (LIPITOR) 10 MG TABLET    Take 10 mg by mouth daily.   CALCIUM CARBONATE (OS-CAL) 600 MG TABS    Take 600 mg by mouth daily.     CLOPIDOGREL (PLAVIX) 75 MG TABLET    TAKE 1 TABLET DAILY   EMTRICITABINE-TENOFOVIR (TRUVADA) 200-300 MG PER TABLET    Take 1 tablet by mouth daily.   NEVIRAPINE (VIRAMUNE XR) 400 MG TB24    Take 400 mg by mouth daily.   OMEGA-3 FATTY ACIDS (FISH OIL) 1000 MG CAPS    Take by mouth daily.   ZOLPIDEM (AMBIEN) 10 MG TABLET    Take 1/2-1 tablet at bedtime as needed for trouble sleeping  Modified Medications   No medications on file  Discontinued Medications   No medications on file    Subjective: Theresa Morrison is in for her routine visit. She's not had any change in her medications and does not recall missing any doses of her Truvada or Viramune since her last visit. She is concerned about her weight and is hoping that she can lose some weight over the coming year. She knows that she  has been eating too much fast food and not exercising. She is moved her treadmill in front of her television with the hopes that that will motivate her to get more exercise.  She states that she is more clear headed than at any time since her stroke 7 years ago. She has been able to focus on her housework and is in the process of cleaning out a lot of clutter that has accumulated over the past few years.  She had her influenza vaccine several weeks ago when she was in for her blood work.  Review of Systems: Constitutional: negative Eyes: negative Ears, nose, mouth, throat, and face: negative Respiratory: negative Cardiovascular: negative Gastrointestinal: negative Genitourinary:negative  Past Medical History  Diagnosis Date  . HIV positive 1987  . Colon polyps     adenomatous  . CVA (cerebral vascular accident)   . IBS (irritable bowel syndrome)   . Shingles   . Toxoplasmosis   . Hypercholesterolemia     History  Substance Use Topics  . Smoking status: Never Smoker   . Smokeless tobacco: Never Used  . Alcohol Use: No    Family History  Problem Relation Age of Onset  . Heart attack Father   . Breast cancer Mother  mets to lung and brain    Allergies  Allergen Reactions  . Penicillins     REACTION: facial swelling  . Stavudine     REACTION: peripheral neuropathy  . Sulfamethoxazole-Trimethoprim     REACTION: hives    Objective: Temp: 98 F (36.7 C) (12/23 0842) Temp src: Oral (12/23 0842) BP: 134/75 mmHg (12/23 0842) Pulse Rate: 72 (12/23 0842)  General: She is in better spirits today than I have seen her in many years. Her weight is stable at 174 pounds her BMI is 29 Oral: No oropharyngeal lesions Skin: No rash Lungs: Clear Cor: Regular S1 and S2 with no murmurs Abdomen: Soft and nontender. No change in her central adiposity Joints and extremities: No change in her lipoatrophy Neuro: Alert with normal speech and conversation Mood and affect: Bright  and normal  Lab Results Lab Results  Component Value Date   WBC 3.7* 07/01/2013   HGB 12.0 07/01/2013   HCT 33.3* 07/01/2013   MCV 96.2 07/01/2013   PLT 165 07/01/2013    Lab Results  Component Value Date   CREATININE 1.10 07/01/2013   BUN 11 07/01/2013   NA 137 07/01/2013   K 3.9 07/01/2013   CL 107 07/01/2013   CO2 27 07/01/2013    Lab Results  Component Value Date   ALT 14 07/01/2013   AST 17 07/01/2013   ALKPHOS 235* 07/01/2013   BILITOT 0.5 07/01/2013    Lab Results  Component Value Date   CHOL 169 07/01/2013   HDL 57 07/01/2013   LDLCALC 99 07/01/2013   TRIG 66 07/01/2013   CHOLHDL 3.0 07/01/2013    Lab Results HIV 1 RNA Quant (copies/mL)  Date Value  07/01/2013 <20   12/31/2012 <20   06/10/2012 <20      CD4 T Cell Abs (/uL)  Date Value  07/01/2013 350*  12/31/2012 360*  06/10/2012 350*     Lab Results  Component Value Date   WBC 3.7* 07/01/2013   HGB 12.0 07/01/2013   HCT 33.3* 07/01/2013   MCV 96.2 07/01/2013   PLT 165 07/01/2013   BMET    Component Value Date/Time   NA 137 07/01/2013 0932   K 3.9 07/01/2013 0932   CL 107 07/01/2013 0932   CO2 27 07/01/2013 0932   GLUCOSE 76 07/01/2013 0932   BUN 11 07/01/2013 0932   CREATININE 1.10 07/01/2013 0932   CREATININE 1.01 11/23/2009 0517   CALCIUM 9.1 07/01/2013 0932   Lab Results  Component Value Date   ALT 14 07/01/2013   AST 17 07/01/2013   ALKPHOS 235* 07/01/2013   BILITOT 0.5 07/01/2013   Lab Results  Component Value Date   CHOL 169 07/01/2013   HDL 57 07/01/2013   LDLCALC 99 07/01/2013   TRIG 66 07/01/2013   CHOLHDL 3.0 07/01/2013   Assessment: Her HIV infection remains under excellent control. She prefers to continue her current drug regimen rather than consolidate to a single pill regimen.  Her LDL cholesterol is now at goal.  Her creatinine is now back to normal.  There is no change in her lipodystrophy. I did encourage her to work on regular exercise.  Plan: 1. Continue Truvada and Viramune 2. Lifestyle  modification 3. Followup after lab work in 6 months   Cliffton Asters, MD [facility] for Infectious Disease [facility] [phone] pager   [phone] cell 07/15/2013, 8:58 AM

## 2013-07-25 ENCOUNTER — Ambulatory Visit (INDEPENDENT_AMBULATORY_CARE_PROVIDER_SITE_OTHER): Payer: Federal, State, Local not specified - PPO | Admitting: Urology

## 2013-07-25 DIAGNOSIS — R3129 Other microscopic hematuria: Secondary | ICD-10-CM

## 2013-07-26 ENCOUNTER — Other Ambulatory Visit: Payer: Self-pay | Admitting: Internal Medicine

## 2013-12-30 ENCOUNTER — Other Ambulatory Visit: Payer: Federal, State, Local not specified - PPO

## 2013-12-30 DIAGNOSIS — Z79899 Other long term (current) drug therapy: Secondary | ICD-10-CM

## 2013-12-30 DIAGNOSIS — B20 Human immunodeficiency virus [HIV] disease: Secondary | ICD-10-CM

## 2013-12-30 LAB — COMPREHENSIVE METABOLIC PANEL
ALT: 17 U/L (ref 0–35)
AST: 20 U/L (ref 0–37)
Albumin: 4.3 g/dL (ref 3.5–5.2)
Alkaline Phosphatase: 211 U/L — ABNORMAL HIGH (ref 39–117)
BILIRUBIN TOTAL: 0.7 mg/dL (ref 0.2–1.2)
BUN: 11 mg/dL (ref 6–23)
CO2: 25 meq/L (ref 19–32)
Calcium: 8.7 mg/dL (ref 8.4–10.5)
Chloride: 109 mEq/L (ref 96–112)
Creat: 1.12 mg/dL — ABNORMAL HIGH (ref 0.50–1.10)
Glucose, Bld: 74 mg/dL (ref 70–99)
Potassium: 3.9 mEq/L (ref 3.5–5.3)
SODIUM: 142 meq/L (ref 135–145)
TOTAL PROTEIN: 6.1 g/dL (ref 6.0–8.3)

## 2013-12-30 LAB — LIPID PANEL
Cholesterol: 141 mg/dL (ref 0–200)
HDL: 43 mg/dL (ref >39)
LDL CALC: 84 mg/dL (ref 0–99)
Total CHOL/HDL Ratio: 3.3 Ratio
Triglycerides: 71 mg/dL (ref <150)
VLDL: 14 mg/dL (ref 0–40)

## 2013-12-30 LAB — CBC
HCT: 31.2 % — ABNORMAL LOW (ref 36.0–46.0)
Hemoglobin: 11 g/dL — ABNORMAL LOW (ref 12.0–15.0)
MCH: 34.8 pg — ABNORMAL HIGH (ref 26.0–34.0)
MCHC: 35.3 g/dL (ref 30.0–36.0)
MCV: 98.7 fL (ref 78.0–100.0)
Platelets: 164 10*3/uL (ref 150–400)
RBC: 3.16 MIL/uL — AB (ref 3.87–5.11)
RDW: 13.2 % (ref 11.5–15.5)
WBC: 2.5 10*3/uL — ABNORMAL LOW (ref 4.0–10.5)

## 2013-12-31 LAB — T-HELPER CELL (CD4) - (RCID CLINIC ONLY)
CD4 % Helper T Cell: 43 % (ref 33–55)
CD4 T CELL ABS: 330 /uL — AB (ref 400–2700)

## 2013-12-31 LAB — HIV-1 RNA QUANT-NO REFLEX-BLD: HIV-1 RNA Quant, Log: <1.3 {Log} (ref <1.30)

## 2014-01-13 ENCOUNTER — Encounter: Payer: Self-pay | Admitting: Internal Medicine

## 2014-01-13 ENCOUNTER — Ambulatory Visit (INDEPENDENT_AMBULATORY_CARE_PROVIDER_SITE_OTHER): Payer: Federal, State, Local not specified - PPO | Admitting: Internal Medicine

## 2014-01-13 VITALS — BP 118/72 | HR 59 | Temp 98.0°F | Wt 171.0 lb

## 2014-01-13 DIAGNOSIS — B20 Human immunodeficiency virus [HIV] disease: Secondary | ICD-10-CM

## 2014-01-13 NOTE — Progress Notes (Signed)
Patient ID: Theresa Morrison, female   DOB: 1956-05-29, 58 y.o.   MRN: [id number]          Patient Active Problem List   Diagnosis Date Noted  . Renal insufficiency 01/09/2012    Priority: High  . Dyslipidemia 05/11/2011    Priority: High  . HIV DISEASE 10/11/2006    Priority: High  . CEREBROVASCULAR ACCIDENT, HX OF 10/02/2006    Priority: High  . 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 Elevated alkaline phosphatase level 01/14/2013  . Hip pain, left 03/08/2011  . ROTATOR CUFF SYNDROME, RIGHT 07/12/2010  . 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 RECTAL BLEEDING 10/11/2006  . SYMPTOM, ABNORMAL LOSS OF WEIGHT 10/11/2006  . 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 LIPODYSTROPHY 10/02/2006  . PERIPHERAL NEUROPATHY 10/02/2006  . ARTHROSCOPY, KNEE, HX OF 10/02/2006    Patient's Medications  New Prescriptions   No medications on file  Previous Medications   ASPIRIN 81 MG TABLET    Take 81 mg by mouth daily.     ATORVASTATIN (LIPITOR) 10 MG TABLET    Take 10 mg by mouth daily.   BETA CAROTENE W/MINERALS (OCUVITE) TABLET    Take 2 tablets by mouth daily.   CALCIUM CARBONATE (OS-CAL) 600 MG TABS    Take 600 mg by mouth daily.     CLOPIDOGREL (PLAVIX) 75 MG TABLET    TAKE 1 TABLET DAILY   EMTRICITABINE-TENOFOVIR (TRUVADA) 200-300 MG PER TABLET    Take 1 tablet by mouth daily.   NEVIRAPINE (VIRAMUNE XR) 400 MG TB24    Take 400 mg by mouth daily.   OMEGA-3 FATTY ACIDS (FISH OIL) 1000 MG CAPS    Take by mouth daily.   ZOLPIDEM (AMBIEN) 10 MG TABLET    Take 1/2-1 tablet at bedtime as needed for trouble sleeping  Modified Medications   No medications on file  Discontinued Medications   No medications on file    Subjective: Theresa Morrison is in for her routine visit. She denies any recent problems or concerns about her health. She missed 10 days of her Viramune recently when her pharmacy was late refilling it. She continued to take her Truvada. She has not missed any other  doses in the past 6 months. She was recently evaluated for painless, microscopic hematuria by Dr. Irine Seal. She recalls being told that she did not need any further evaluation or treatment. Review of Systems: Pertinent items are noted in HPI.  Past Medical History  Diagnosis Date  . HIV positive 1987  . Colon polyps     adenomatous  . CVA (cerebral vascular accident)   . IBS (irritable bowel syndrome)   . Shingles   . Toxoplasmosis   . Hypercholesterolemia     History  Substance Use Topics  . Smoking status: Never Smoker   . Smokeless tobacco: Never Used  . Alcohol Use: No    Family History  Problem Relation Age of Onset  . Heart attack Father   . Breast cancer Mother     mets to lung and brain    Allergies  Allergen Reactions  . Penicillins     REACTION: facial swelling  . Stavudine     REACTION: peripheral neuropathy  . Sulfamethoxazole-Trimethoprim     REACTION: hives    Objective: Temp: 98 F (36.7 C) (06/23 0945) Temp src: Oral (06/23 0945) BP: 118/72 mmHg (06/23 0945) Pulse Rate: 59 (06/23 0945) Body mass index is 29.34 kg/(m^2).  General: No change  in truncal obesity and lipoatrophy of her extremities Oral: No oropharyngeal lesions Skin: No rash Lungs: Clear Cor: Regular S1 and S2 with no murmurs  Lab Results Lab Results  Component Value Date   WBC 2.5* 12/30/2013   HGB 11.0* 12/30/2013   HCT 31.2* 12/30/2013   MCV 98.7 12/30/2013   PLT 164 12/30/2013    Lab Results  Component Value Date   CREATININE 1.12* 12/30/2013   BUN 11 12/30/2013   NA 142 12/30/2013   K 3.9 12/30/2013   CL 109 12/30/2013   CO2 25 12/30/2013    Lab Results  Component Value Date   ALT 17 12/30/2013   AST 20 12/30/2013   ALKPHOS 211* 12/30/2013   BILITOT 0.7 12/30/2013    Lab Results  Component Value Date   CHOL 141 12/30/2013   HDL 43 12/30/2013   LDLCALC 84 12/30/2013   TRIG 71 12/30/2013   CHOLHDL 3.3 12/30/2013    Lab Results HIV 1 RNA Quant (copies/mL)  Date Value  12/30/2013 <20     07/01/2013 <20   12/31/2012 <20      CD4 T Cell Abs (/uL)  Date Value  12/30/2013 330*  07/01/2013 350*  12/31/2012 360*     Assessment: Her HIV infection remains under excellent control. Again, I offered simplification to a once daily to her regimen but she prefers to stay on her current regimen. I told her that she is ever out of one of her medicines she should stop taking both until they are refilled.  Plan: 1. Continue Truvada and Viramune 2. Follow up after lab work in [place] months   Michel Bickers, MD [facility] for [place] [phone] pager   [phone] cell 01/13/2014, 9:59 AM

## 2014-01-27 ENCOUNTER — Other Ambulatory Visit: Payer: Self-pay | Admitting: Internal Medicine

## 2014-01-28 ENCOUNTER — Other Ambulatory Visit: Payer: Self-pay | Admitting: Obstetrics and Gynecology

## 2014-01-28 DIAGNOSIS — R928 Other abnormal and inconclusive findings on diagnostic imaging of breast: Secondary | ICD-10-CM

## 2014-02-10 ENCOUNTER — Ambulatory Visit
Admission: RE | Admit: 2014-02-10 | Discharge: 2014-02-10 | Disposition: A | Discharge status: Home / Self Care | Payer: Federal, State, Local not specified - PPO | Source: Ambulatory Visit | Attending: Obstetrics and Gynecology | Admitting: Obstetrics and Gynecology

## 2014-02-10 DIAGNOSIS — R928 Other abnormal and inconclusive findings on diagnostic imaging of breast: Secondary | ICD-10-CM

## 2014-03-12 ENCOUNTER — Other Ambulatory Visit: Payer: Self-pay | Admitting: Internal Medicine

## 2014-07-01 ENCOUNTER — Ambulatory Visit
Admission: RE | Admit: 2014-07-01 | Discharge: 2014-07-01 | Disposition: A | Discharge status: Home / Self Care | Payer: Federal, State, Local not specified - PPO | Source: Ambulatory Visit | Attending: Cardiology | Admitting: Cardiology

## 2014-07-01 ENCOUNTER — Other Ambulatory Visit: Payer: Self-pay | Admitting: Cardiology

## 2014-07-01 DIAGNOSIS — R0602 Shortness of breath: Secondary | ICD-10-CM

## 2014-07-13 ENCOUNTER — Encounter (HOSPITAL_COMMUNITY): Payer: Self-pay | Admitting: *Deleted

## 2014-07-13 ENCOUNTER — Ambulatory Visit (INDEPENDENT_AMBULATORY_CARE_PROVIDER_SITE_OTHER): Payer: Federal, State, Local not specified - PPO | Admitting: Internal Medicine

## 2014-07-13 ENCOUNTER — Inpatient Hospital Stay (HOSPITAL_COMMUNITY)
Admission: EM | Admit: 2014-07-13 | Discharge: 2014-07-16 | DRG: 812 | Disposition: A | Discharge status: Home / Self Care | Payer: Federal, State, Local not specified - PPO | Attending: Internal Medicine | Admitting: Internal Medicine

## 2014-07-13 ENCOUNTER — Encounter: Payer: Self-pay | Admitting: Internal Medicine

## 2014-07-13 DIAGNOSIS — Z880 Allergy status to penicillin: Secondary | ICD-10-CM

## 2014-07-13 DIAGNOSIS — D589 Hereditary hemolytic anemia, unspecified: Secondary | ICD-10-CM | POA: Diagnosis not present

## 2014-07-13 DIAGNOSIS — R634 Abnormal weight loss: Secondary | ICD-10-CM

## 2014-07-13 DIAGNOSIS — B589 Toxoplasmosis, unspecified: Secondary | ICD-10-CM | POA: Diagnosis present

## 2014-07-13 DIAGNOSIS — B20 Human immunodeficiency virus [HIV] disease: Secondary | ICD-10-CM | POA: Diagnosis not present

## 2014-07-13 DIAGNOSIS — Z882 Allergy status to sulfonamides status: Secondary | ICD-10-CM

## 2014-07-13 DIAGNOSIS — K625 Hemorrhage of anus and rectum: Secondary | ICD-10-CM

## 2014-07-13 DIAGNOSIS — D649 Anemia, unspecified: Secondary | ICD-10-CM | POA: Diagnosis not present

## 2014-07-13 DIAGNOSIS — Z8673 Personal history of transient ischemic attack (TIA), and cerebral infarction without residual deficits: Secondary | ICD-10-CM

## 2014-07-13 DIAGNOSIS — E785 Hyperlipidemia, unspecified: Secondary | ICD-10-CM | POA: Diagnosis present

## 2014-07-13 DIAGNOSIS — D539 Nutritional anemia, unspecified: Secondary | ICD-10-CM | POA: Diagnosis present

## 2014-07-13 DIAGNOSIS — E780 Pure hypercholesterolemia: Secondary | ICD-10-CM | POA: Diagnosis present

## 2014-07-13 DIAGNOSIS — Z7982 Long term (current) use of aspirin: Secondary | ICD-10-CM

## 2014-07-13 DIAGNOSIS — Z86010 Personal history of colonic polyps: Secondary | ICD-10-CM

## 2014-07-13 DIAGNOSIS — K589 Irritable bowel syndrome without diarrhea: Secondary | ICD-10-CM | POA: Diagnosis present

## 2014-07-13 DIAGNOSIS — B029 Zoster without complications: Secondary | ICD-10-CM | POA: Diagnosis present

## 2014-07-13 DIAGNOSIS — Z21 Asymptomatic human immunodeficiency virus [HIV] infection status: Secondary | ICD-10-CM | POA: Diagnosis present

## 2014-07-13 DIAGNOSIS — Z8679 Personal history of other diseases of the circulatory system: Secondary | ICD-10-CM

## 2014-07-13 DIAGNOSIS — E881 Lipodystrophy, not elsewhere classified: Secondary | ICD-10-CM

## 2014-07-13 DIAGNOSIS — A5609 Other chlamydial infection of lower genitourinary tract: Secondary | ICD-10-CM

## 2014-07-13 DIAGNOSIS — R740 Nonspecific elevation of levels of transaminase and lactic acid dehydrogenase [LDH]: Secondary | ICD-10-CM | POA: Diagnosis present

## 2014-07-13 DIAGNOSIS — E860 Dehydration: Secondary | ICD-10-CM | POA: Diagnosis present

## 2014-07-13 DIAGNOSIS — N179 Acute kidney failure, unspecified: Secondary | ICD-10-CM | POA: Diagnosis present

## 2014-07-13 DIAGNOSIS — I10 Essential (primary) hypertension: Secondary | ICD-10-CM | POA: Diagnosis present

## 2014-07-13 LAB — CBC WITH DIFFERENTIAL/PLATELET
BASOS ABS: 0 10*3/uL (ref 0.0–0.1)
Basophils Relative: 0 % (ref 0–1)
Eosinophils Absolute: 0 10*3/uL (ref 0.0–0.7)
Eosinophils Relative: 0 % (ref 0–5)
HCT: 12.8 % — CL (ref 36.0–46.0)
Hemoglobin: 4.2 g/dL — CL (ref 12.0–15.0)
LYMPHS PCT: 25 % (ref 12–46)
Lymphs Abs: 1.6 10*3/uL (ref 0.7–4.0)
MCH: 41.2 pg — ABNORMAL HIGH (ref 26.0–34.0)
MCHC: 32 g/dL (ref 30.0–36.0)
MCV: 125.5 fL — AB (ref 78.0–100.0)
MONO ABS: 0.5 10*3/uL (ref 0.1–1.0)
MPV: 8.9 fL — AB (ref 9.4–12.4)
Monocytes Relative: 8 % (ref 3–12)
NEUTROS ABS: 4.4 10*3/uL (ref 1.7–7.7)
NEUTROS PCT: 67 % (ref 43–77)
PLATELETS: 212 10*3/uL (ref 150–400)
RBC: 1.02 MIL/uL — AB (ref 3.87–5.11)
RDW: 21.3 % — ABNORMAL HIGH (ref 11.5–15.5)
WBC: 6.5 10*3/uL (ref 4.0–10.5)

## 2014-07-13 LAB — C-REACTIVE PROTEIN: CRP: 3.5 mg/dL — ABNORMAL HIGH (ref <0.60)

## 2014-07-13 LAB — COMPREHENSIVE METABOLIC PANEL
ALT: 11 U/L (ref 0–35)
AST: 33 U/L (ref 0–37)
Albumin: 4.4 g/dL (ref 3.5–5.2)
Alkaline Phosphatase: 132 U/L — ABNORMAL HIGH (ref 39–117)
BUN: 17 mg/dL (ref 6–23)
CALCIUM: 8.8 mg/dL (ref 8.4–10.5)
CHLORIDE: 105 meq/L (ref 96–112)
CO2: 24 mEq/L (ref 19–32)
Creat: 1.1 mg/dL (ref 0.50–1.10)
Glucose, Bld: 96 mg/dL (ref 70–99)
Potassium: 3.7 mEq/L (ref 3.5–5.3)
Sodium: 137 mEq/L (ref 135–145)
Total Bilirubin: 2.9 mg/dL — ABNORMAL HIGH (ref 0.2–1.2)
Total Protein: 6 g/dL (ref 6.0–8.3)

## 2014-07-13 NOTE — ED Notes (Signed)
The pt is c/o being weak with sob weakness since thanksgiving.  She had a stress test  Tuesday.  She ad blood drawn earlier today and was called at home and was tiold her hgb was lower than 5.  Yellow tint to her skin

## 2014-07-13 NOTE — ED Provider Notes (Signed)
CSN: [id number]     Arrival date & time 07/13/14  2336 History  This chart was scribed for Hoy Morn, MD by Marlowe Kays, ED Scribe. This patient was seen in room D35C/D35C and the patient's care was started at 12:10 AM.  Chief Complaint  Patient presents with  . Abnormal Lab   The history is provided by the patient. No language interpreter was used.    HPI Comments:  Theresa Morrison is a 58 y.o. female with PMH of HIV who presents to the Emergency Department complaining of an abnormal lab. Pt was seen at her cardiologist office, Dr. Einar Gip, today and had blood drawn. She was called at home and told that her hemoglobin was less than 5. Pt reports generalized weakness and an episode of emesis earlier this evening. She reports associated nausea. There are no modifying factors noted. Denies back pain. PMH of CVA, IBS, colon polyps, shingles, toxoplasmosis and hypercholesterolemia.  PCP is Dr. Shelia Media  Past Medical History  Diagnosis Date  . HIV positive 1987  . Colon polyps     adenomatous  . CVA (cerebral vascular accident)   . IBS (irritable bowel syndrome)   . Shingles   . Toxoplasmosis   . Hypercholesterolemia    Past Surgical History  Procedure Laterality Date  . Tonsillectomy    . Appendectomy  2001  . Knee arthroscopy Bilateral   . Shoulder surgery Right    Family History  Problem Relation Age of Onset  . Heart attack Father   . Breast cancer Mother     mets to lung and brain   History  Substance Use Topics  . Smoking status: Never Smoker   . Smokeless tobacco: Never Used  . Alcohol Use: No   OB History    No data available     Review of Systems  A complete 10 system review of systems was obtained and all systems are negative except as noted in the HPI and PMH.   Allergies  Penicillins; Stavudine; and Sulfamethoxazole-trimethoprim  Home Medications   Prior to Admission medications   Medication Sig Start Date End Date Taking? Authorizing Provider   aspirin 81 MG tablet Take 81 mg by mouth daily.     Yes Historical Provider, MD  atorvastatin (LIPITOR) 10 MG tablet Take 10 mg by mouth daily.   Yes Historical Provider, MD  beta carotene w/minerals (OCUVITE) tablet Take 2 tablets by mouth daily.   Yes Historical Provider, MD  calcium carbonate (OS-CAL) 600 MG TABS Take 600 mg by mouth daily.     Yes Historical Provider, MD  clopidogrel (PLAVIX) 75 MG tablet Take 75 mg by mouth daily.   Yes Historical Provider, MD  emtricitabine-tenofovir (TRUVADA) 200-300 MG per tablet Take 1 tablet by mouth daily.   Yes Historical Provider, MD  metoprolol succinate (TOPROL-XL) 25 MG 24 hr tablet Take 25 mg by mouth daily. 07/01/14  Yes Historical Provider, MD  nevirapine (VIRAMUNE) 200 MG tablet Take 400 mg by mouth daily.   Yes Historical Provider, MD  NITROSTAT 0.4 MG SL tablet Place 0.4 mg under the tongue every 5 (five) minutes as needed for chest pain.  07/01/14  Yes Historical Provider, MD  Omega-3 Fatty Acids (FISH OIL) 1000 MG CAPS Take 1 capsule by mouth daily.    Yes Historical Provider, MD  zolpidem (AMBIEN) 10 MG tablet Take 1/2-1 tablet at bedtime as needed for trouble sleeping 01/14/13  Yes Michel Bickers, MD  clopidogrel (PLAVIX) 75 MG tablet TAKE  1 TABLET DAILY 07/26/13   Michel Bickers, MD  Nevirapine 400 MG TB24 TAKE 1 TABLET DAILY 03/13/14   Michel Bickers, MD  TRUVADA 200-300 MG per tablet TAKE 1 TABLET DAILY 01/27/14   Michel Bickers, MD   Triage Vitals: BP 100/44 mmHg  Pulse 102  Temp(Src) 98.6 F (37 C) (Oral)  Resp 24  SpO2 95% Physical Exam  Constitutional: She is oriented to person, place, and time. She appears well-developed and well-nourished. No distress.  HENT:  Head: Normocephalic and atraumatic.  Eyes: EOM are normal.  Neck: Normal range of motion.  Cardiovascular: Normal rate, regular rhythm and normal heart sounds.   Pulmonary/Chest: Effort normal and breath sounds normal. No respiratory distress.  Abdominal: Soft. She exhibits  no distension. There is no tenderness.  Genitourinary:  Brown stool.  No gross blood.  Musculoskeletal: Normal range of motion.  Neurological: She is alert and oriented to person, place, and time.  Skin: Skin is warm and dry. No rash noted. There is pallor.  Psychiatric: She has a normal mood and affect. Judgment normal.  Nursing note and vitals reviewed.   ED Course  Procedures (including critical care time)  CRITICAL CARE Performed by: Hoy Morn Total critical care time: 30 Critical care time was exclusive of separately billable procedures and treating other patients. Critical care was necessary to treat or prevent imminent or life-threatening deterioration. Critical care was time spent personally by me on the following activities: development of treatment plan with patient and/or surrogate as well as nursing, discussions with consultants, evaluation of patient's response to treatment, examination of patient, obtaining history from patient or surrogate, ordering and performing treatments and interventions, ordering and review of laboratory studies, ordering and review of radiographic studies, pulse oximetry and re-evaluation of patient's condition.   DIAGNOSTIC STUDIES: Oxygen Saturation is 95% on RA, adequate by my interpretation.   COORDINATION OF CARE: 12:19 AM- Will order lab work. Pt verbalizes understanding and agrees to plan.  Medications - No data to display  Labs Review Labs Reviewed  CBC WITH DIFFERENTIAL - Abnormal; Notable for the following:    RBC <1.00 (*)    Hemoglobin 4.0 (*)    HCT 12.7 (*)    MCV 129.6 (*)    MCH 40.8 (*)    RDW 27.0 (*)    All other components within normal limits  COMPREHENSIVE METABOLIC PANEL - Abnormal; Notable for the following:    Potassium 3.2 (*)    Glucose, Bld 113 (*)    Creatinine, Ser 1.18 (*)    Total Protein 5.8 (*)    Alkaline Phosphatase 138 (*)    Total Bilirubin 1.9 (*)    GFR calc non Af Amer 50 (*)    GFR  calc Af Amer 58 (*)    All other components within normal limits  RETICULOCYTES - Abnormal; Notable for the following:    Retic Ct Pct >23.0 (*)    RBC. <1.00 (*)    All other components within normal limits  LIPASE, BLOOD  PROTIME-INR  VITAMIN B12  FOLATE  IRON AND TIBC  FERRITIN  POC OCCULT BLOOD, ED  TYPE AND SCREEN    Imaging Review No results found.   EKG Interpretation None      MDM   Final diagnoses:  Anemia, unspecified anemia type    Symptomatic anemia.  Will transfuse blood.  Hemodynamically stable.  Hemoglobin is 4.  White blood cells and platelets are normal.  Retake count is appropriate.  Remainder of  anemia workup is pending at this time.  Admit to the hospital  I personally performed the services described in this documentation, which was scribed in my presence. The recorded information has been reviewed and is accurate.    Hoy Morn, MD 07/14/14 [phone]

## 2014-07-13 NOTE — Progress Notes (Signed)
Patient ID: Theresa Morrison, female   DOB: 1956/03/07, 58 y.o.   MRN: [id number]          Patient Active Problem List   Diagnosis Date Noted  . Renal insufficiency 01/09/2012    Priority: High  . Dyslipidemia 05/11/2011    Priority: High  . Human immunodeficiency virus (HIV) disease 10/11/2006    Priority: High  . CEREBROVASCULAR ACCIDENT, HX OF 10/02/2006    Priority: High  . 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 Elevated alkaline phosphatase level 01/14/2013  . Hip pain, left 03/08/2011  . ROTATOR CUFF SYNDROME, RIGHT 07/12/2010  . 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 RECTAL BLEEDING 10/11/2006  . SYMPTOM, ABNORMAL LOSS OF WEIGHT 10/11/2006  . 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 LIPODYSTROPHY 10/02/2006  . PERIPHERAL NEUROPATHY 10/02/2006  . ARTHROSCOPY, KNEE, HX OF 10/02/2006    Patient's Medications  New Prescriptions   No medications on file  Previous Medications   ASPIRIN 81 MG TABLET    Take 81 mg by mouth daily.     ATORVASTATIN (LIPITOR) 10 MG TABLET    Take 10 mg by mouth daily.   BETA CAROTENE W/MINERALS (OCUVITE) TABLET    Take 2 tablets by mouth daily.   CALCIUM CARBONATE (OS-CAL) 600 MG TABS    Take 600 mg by mouth daily.     CLOPIDOGREL (PLAVIX) 75 MG TABLET    TAKE 1 TABLET DAILY   NEVIRAPINE 400 MG TB24    TAKE 1 TABLET DAILY   OMEGA-3 FATTY ACIDS (FISH OIL) 1000 MG CAPS    Take by mouth daily.   TRUVADA 200-300 MG PER TABLET    TAKE 1 TABLET DAILY   ZOLPIDEM (AMBIEN) 10 MG TABLET    Take 1/2-1 tablet at bedtime as needed for trouble sleeping  Modified Medications   No medications on file  Discontinued Medications   No medications on file    Subjective: Theresa Morrison seen on a work in basis. She walked into clinic this afternoon stating that she has not felt well for the past month. She recalls having the sudden onset of dyspnea on exertion associated with some chest pain just before Thanksgiving. She also recalls noting be able  to hear her heartbeat in her left ear. She has had anorexia and one episode of abdominal cramping, nausea, vomiting and diarrhea recently. She saw her primary care physician, Dr. Shelia Media, who referred her to Dr. Adrian Prows for a cardiac evaluation. Apparently she's had a normal chest x-ray. She recalls being told that she had an echocardiogram and a nuclear stress test both of which were normal. She states that she has been feeling gradually worse over the past month. She does not believe that she's had any blood work done. She has not been aware of any fever but says that when she had tympanic temperature taken at Dr. Irven Shelling office earlier today she was told it was 102.7 and that she should come here. As usual, she does not believe she is missed any doses of her HIV medications.   Review of Systems: Constitutional: positive for anorexia, malaise and she has felt very cold for the past 2 weeks, negative for chills, fevers, sweats and weight loss Eyes: negative Ears, nose, mouth, throat, and face: as noted in history of present illness Respiratory: positive for dyspnea on exertion and pleurisy/chest pain, negative for cough, sputum and wheezing Cardiovascular: positive for dyspnea and exertional chest pressure/discomfort, negative for irregular heart beat, near-syncope, orthopnea and  paroxysmal nocturnal dyspnea Gastrointestinal: positive for abdominal pain, diarrhea, nausea and vomiting, negative for constipation, odynophagia and reflux symptoms Genitourinary:negative  Past Medical History  Diagnosis Date  . HIV positive 1987  . Colon polyps     adenomatous  . CVA (cerebral vascular accident)   . IBS (irritable bowel syndrome)   . Shingles   . Toxoplasmosis   . Hypercholesterolemia     History  Substance Use Topics  . Smoking status: Never Smoker   . Smokeless tobacco: Never Used  . Alcohol Use: No    Family History  Problem Relation Age of Onset  . Heart attack Father   . Breast  cancer Mother     mets to lung and brain    Allergies  Allergen Reactions  . Penicillins     REACTION: facial swelling  . Stavudine     REACTION: peripheral neuropathy  . Sulfamethoxazole-Trimethoprim     REACTION: hives    Objective: Temp: 98.3 F (36.8 C) (12/21 1608) Temp Source: Oral (12/21 1608) BP: 107/67 mmHg (12/21 1608) Pulse Rate: 97 (12/21 1608) Body mass index is 29.04 kg/(m^2).  General: Her skin is ashen Lymph nodes: No palpable adenopathy Oral: No oropharyngeal lesions Skin: No rash Eyes: Slight scleral icterus Lungs: Clear Cor: Regular S1 and S2 with no murmurs Abdomen: No change in central adiposity. Abdomen is soft and nontender with no palpable masses Joints and extremities: No change in lipoatrophy of her extremities Neuro: Alert with normal speech and conversation Mood: Appropriately worried  Lab Results Lab Results  Component Value Date   WBC 2.5* 12/30/2013   HGB 11.0* 12/30/2013   HCT 31.2* 12/30/2013   MCV 98.7 12/30/2013   PLT 164 12/30/2013    Lab Results  Component Value Date   CREATININE 1.12* 12/30/2013   BUN 11 12/30/2013   NA 142 12/30/2013   K 3.9 12/30/2013   CL 109 12/30/2013   CO2 25 12/30/2013    Lab Results  Component Value Date   ALT 17 12/30/2013   AST 20 12/30/2013   ALKPHOS 211* 12/30/2013   BILITOT 0.7 12/30/2013    Lab Results  Component Value Date   CHOL 141 12/30/2013   HDL 43 12/30/2013   LDLCALC 84 12/30/2013   TRIG 71 12/30/2013   CHOLHDL 3.3 12/30/2013    Lab Results HIV 1 RNA QUANT (copies/mL)  Date Value  12/30/2013 <20  07/01/2013 <20  12/31/2012 <20   CD4 T CELL ABS  Date Value  12/30/2013 330 /uL*  07/01/2013 350 /uL*  12/31/2012 360 cmm*     Assessment: I'm not sure what is causing Cariah's recent illness but she certainly does not look well and appears to have some jaundice. I'm not sure if he's been having a febrile illness. I will check her basic blood work and blood  cultures and call her tomorrow. I doubt that this is related to HIV infection.  Plan: 1. CBC with differential, complete metabolic panel, sedimentation rate, C-reactive protein, TSH and blood cultures 2. Follow-up by phone tomorrow   Michel Bickers, MD [place] [facility] [place] [phone] pager   [phone] cell 07/13/2014, 5:09 PM

## 2014-07-14 ENCOUNTER — Encounter (HOSPITAL_COMMUNITY): Payer: Self-pay | Admitting: Internal Medicine

## 2014-07-14 DIAGNOSIS — E860 Dehydration: Secondary | ICD-10-CM | POA: Diagnosis present

## 2014-07-14 DIAGNOSIS — Z7982 Long term (current) use of aspirin: Secondary | ICD-10-CM | POA: Diagnosis not present

## 2014-07-14 DIAGNOSIS — Z86010 Personal history of colonic polyps: Secondary | ICD-10-CM | POA: Diagnosis not present

## 2014-07-14 DIAGNOSIS — D589 Hereditary hemolytic anemia, unspecified: Secondary | ICD-10-CM | POA: Diagnosis present

## 2014-07-14 DIAGNOSIS — I10 Essential (primary) hypertension: Secondary | ICD-10-CM | POA: Diagnosis present

## 2014-07-14 DIAGNOSIS — B029 Zoster without complications: Secondary | ICD-10-CM | POA: Diagnosis present

## 2014-07-14 DIAGNOSIS — E780 Pure hypercholesterolemia: Secondary | ICD-10-CM | POA: Diagnosis present

## 2014-07-14 DIAGNOSIS — B20 Human immunodeficiency virus [HIV] disease: Secondary | ICD-10-CM

## 2014-07-14 DIAGNOSIS — Z882 Allergy status to sulfonamides status: Secondary | ICD-10-CM | POA: Diagnosis not present

## 2014-07-14 DIAGNOSIS — K589 Irritable bowel syndrome without diarrhea: Secondary | ICD-10-CM | POA: Diagnosis present

## 2014-07-14 DIAGNOSIS — D539 Nutritional anemia, unspecified: Secondary | ICD-10-CM

## 2014-07-14 DIAGNOSIS — Z880 Allergy status to penicillin: Secondary | ICD-10-CM | POA: Diagnosis not present

## 2014-07-14 DIAGNOSIS — N179 Acute kidney failure, unspecified: Secondary | ICD-10-CM | POA: Diagnosis present

## 2014-07-14 DIAGNOSIS — D649 Anemia, unspecified: Secondary | ICD-10-CM | POA: Diagnosis present

## 2014-07-14 DIAGNOSIS — E785 Hyperlipidemia, unspecified: Secondary | ICD-10-CM | POA: Diagnosis present

## 2014-07-14 DIAGNOSIS — B589 Toxoplasmosis, unspecified: Secondary | ICD-10-CM | POA: Diagnosis present

## 2014-07-14 DIAGNOSIS — Z8673 Personal history of transient ischemic attack (TIA), and cerebral infarction without residual deficits: Secondary | ICD-10-CM | POA: Diagnosis not present

## 2014-07-14 DIAGNOSIS — R740 Nonspecific elevation of levels of transaminase and lactic acid dehydrogenase [LDH]: Secondary | ICD-10-CM | POA: Diagnosis present

## 2014-07-14 DIAGNOSIS — Z8679 Personal history of other diseases of the circulatory system: Secondary | ICD-10-CM

## 2014-07-14 DIAGNOSIS — Z21 Asymptomatic human immunodeficiency virus [HIV] infection status: Secondary | ICD-10-CM | POA: Diagnosis present

## 2014-07-14 LAB — URINE MICROSCOPIC-ADD ON

## 2014-07-14 LAB — URINALYSIS, ROUTINE W REFLEX MICROSCOPIC
Bilirubin Urine: NEGATIVE
Glucose, UA: NEGATIVE mg/dL
Ketones, ur: NEGATIVE mg/dL
Nitrite: NEGATIVE
PROTEIN: 30 mg/dL — AB
Specific Gravity, Urine: 1.016 (ref 1.005–1.030)
Urobilinogen, UA: 0.2 mg/dL (ref 0.0–1.0)
pH: 5 (ref 5.0–8.0)

## 2014-07-14 LAB — CBC WITH DIFFERENTIAL/PLATELET
Basophils Absolute: 0 10*3/uL (ref 0.0–0.1)
Basophils Absolute: 0 10*3/uL (ref 0.0–0.1)
Basophils Relative: 0 % (ref 0–1)
Basophils Relative: 0 % (ref 0–1)
EOS PCT: 0 % (ref 0–5)
Eosinophils Absolute: 0 10*3/uL (ref 0.0–0.7)
Eosinophils Absolute: 0 10*3/uL (ref 0.0–0.7)
Eosinophils Relative: 0 % (ref 0–5)
HCT: 10.1 % — ABNORMAL LOW (ref 36.0–46.0)
HCT: 12.7 % — ABNORMAL LOW (ref 36.0–46.0)
HEMOGLOBIN: 3.2 g/dL — AB (ref 12.0–15.0)
Hemoglobin: 4 g/dL — CL (ref 12.0–15.0)
LYMPHS ABS: 1.2 10*3/uL (ref 0.7–4.0)
LYMPHS PCT: 22 % (ref 12–46)
LYMPHS PCT: 25 % (ref 12–46)
Lymphs Abs: 1.5 10*3/uL (ref 0.7–4.0)
MCH: 40.8 pg — ABNORMAL HIGH (ref 26.0–34.0)
MCH: 41.6 pg — ABNORMAL HIGH (ref 26.0–34.0)
MCHC: 31.5 g/dL (ref 30.0–36.0)
MCHC: 31.7 g/dL (ref 30.0–36.0)
MCV: 129.6 fL — ABNORMAL HIGH (ref 78.0–100.0)
MCV: 131.2 fL — AB (ref 78.0–100.0)
MONOS PCT: 9 % (ref 3–12)
Monocytes Absolute: 0.4 10*3/uL (ref 0.1–1.0)
Monocytes Absolute: 0.8 10*3/uL (ref 0.1–1.0)
Monocytes Relative: 11 % (ref 3–12)
NEUTROS ABS: 3.3 10*3/uL (ref 1.7–7.7)
NEUTROS PCT: 67 % (ref 43–77)
Neutro Abs: 4.6 10*3/uL (ref 1.7–7.7)
Neutrophils Relative %: 66 % (ref 43–77)
PLATELETS: 207 10*3/uL (ref 150–400)
Platelets: 176 10*3/uL (ref 150–400)
RBC: <1 MIL/uL — ABNORMAL LOW (ref 3.87–5.11)
RDW: 27 % — ABNORMAL HIGH (ref 11.5–15.5)
RDW: 27.2 % — ABNORMAL HIGH (ref 11.5–15.5)
WBC: 4.9 10*3/uL (ref 4.0–10.5)
WBC: 6.9 10*3/uL (ref 4.0–10.5)

## 2014-07-14 LAB — IRON AND TIBC
Iron: 236 ug/dL — ABNORMAL HIGH (ref 42–135)
Saturation Ratios: 83 % — ABNORMAL HIGH (ref 20–55)
TIBC: 285 ug/dL (ref 250–470)
UIBC: 49 ug/dL — ABNORMAL LOW (ref 125–400)

## 2014-07-14 LAB — COMPREHENSIVE METABOLIC PANEL
ALBUMIN: 3.5 g/dL (ref 3.5–5.2)
ALK PHOS: 138 U/L — AB (ref 39–117)
ALT: 12 U/L (ref 0–35)
ALT: 15 U/L (ref 0–35)
ANION GAP: 10 (ref 5–15)
ANION GAP: 8 (ref 5–15)
AST: 33 U/L (ref 0–37)
AST: 37 U/L (ref 0–37)
Albumin: 4.1 g/dL (ref 3.5–5.2)
Alkaline Phosphatase: 114 U/L (ref 39–117)
BUN: 16 mg/dL (ref 6–23)
BUN: 17 mg/dL (ref 6–23)
CALCIUM: 8.8 mg/dL (ref 8.4–10.5)
CO2: 20 mmol/L (ref 19–32)
CO2: 21 mmol/L (ref 19–32)
CREATININE: 1.25 mg/dL — AB (ref 0.50–1.10)
Calcium: 8.4 mg/dL (ref 8.4–10.5)
Chloride: 108 mEq/L (ref 96–112)
Chloride: 108 mEq/L (ref 96–112)
Creatinine, Ser: 1.18 mg/dL — ABNORMAL HIGH (ref 0.50–1.10)
GFR calc Af Amer: 54 mL/min — ABNORMAL LOW (ref >90)
GFR calc Af Amer: 58 mL/min — ABNORMAL LOW (ref >90)
GFR calc non Af Amer: 47 mL/min — ABNORMAL LOW (ref >90)
GFR calc non Af Amer: 50 mL/min — ABNORMAL LOW (ref >90)
Glucose, Bld: 113 mg/dL — ABNORMAL HIGH (ref 70–99)
Glucose, Bld: 116 mg/dL — ABNORMAL HIGH (ref 70–99)
POTASSIUM: 3.2 mmol/L — AB (ref 3.5–5.1)
Potassium: 3.4 mmol/L — ABNORMAL LOW (ref 3.5–5.1)
SODIUM: 137 mmol/L (ref 135–145)
Sodium: 138 mmol/L (ref 135–145)
TOTAL PROTEIN: 5 g/dL — AB (ref 6.0–8.3)
TOTAL PROTEIN: 5.8 g/dL — AB (ref 6.0–8.3)
Total Bilirubin: 1.6 mg/dL — ABNORMAL HIGH (ref 0.3–1.2)
Total Bilirubin: 1.9 mg/dL — ABNORMAL HIGH (ref 0.3–1.2)

## 2014-07-14 LAB — POC OCCULT BLOOD, ED: FECAL OCCULT BLD: NEGATIVE

## 2014-07-14 LAB — LIPASE, BLOOD: Lipase: 26 U/L (ref 11–59)

## 2014-07-14 LAB — RETICULOCYTES
RBC.: <1 MIL/uL — ABNORMAL LOW (ref 3.87–5.11)
Retic Ct Pct: >23 % — ABNORMAL HIGH (ref 0.4–3.1)

## 2014-07-14 LAB — TROPONIN I: Troponin I: <0.03 ng/mL (ref <0.031)

## 2014-07-14 LAB — FERRITIN: Ferritin: 205 ng/mL (ref 10–291)

## 2014-07-14 LAB — VITAMIN B12: VITAMIN B 12: 246 pg/mL (ref 211–911)

## 2014-07-14 LAB — HAPTOGLOBIN: Haptoglobin: <25 mg/dL — ABNORMAL LOW (ref 45–215)

## 2014-07-14 LAB — PREPARE RBC (CROSSMATCH)

## 2014-07-14 LAB — SEDIMENTATION RATE: Sed Rate: 3 mm/hr (ref 0–22)

## 2014-07-14 LAB — PROTIME-INR
INR: 1.19 (ref 0.00–1.49)
Prothrombin Time: 15.2 seconds (ref 11.6–15.2)

## 2014-07-14 LAB — FOLATE: FOLATE: 12 ng/mL

## 2014-07-14 LAB — DIRECT ANTIGLOBULIN TEST (NOT AT ARMC)
DAT, IgG: POSITIVE
DAT, complement: POSITIVE

## 2014-07-14 LAB — LACTATE DEHYDROGENASE: LDH: 719 U/L — AB (ref 94–250)

## 2014-07-14 LAB — TSH: TSH: 1.264 u[IU]/mL (ref 0.350–4.500)

## 2014-07-14 MED ORDER — CLOPIDOGREL BISULFATE 75 MG PO TABS
75.0000 mg | ORAL_TABLET | Freq: Every day | ORAL | Status: DC
  Administered 2014-07-14 – 2014-07-15 (×2): 75 mg via ORAL
  Filled 2014-07-14 (×4): qty 1

## 2014-07-14 MED ORDER — FISH OIL 1000 MG PO CAPS: 1.0000 | ORAL_CAPSULE | Freq: Every day | ORAL | Status: DC

## 2014-07-14 MED ORDER — NITROGLYCERIN 0.4 MG SL SUBL: 0.4000 mg | SUBLINGUAL_TABLET | SUBLINGUAL | Status: DC | PRN

## 2014-07-14 MED ORDER — ACETAMINOPHEN 325 MG PO TABS: 650.0000 mg | ORAL_TABLET | Freq: Four times a day (QID) | ORAL | Status: DC | PRN

## 2014-07-14 MED ORDER — FOLIC ACID 1 MG PO TABS
1.0000 mg | ORAL_TABLET | Freq: Every day | ORAL | Status: DC
  Administered 2014-07-14 – 2014-07-16 (×3): 1 mg via ORAL
  Filled 2014-07-14 (×4): qty 1

## 2014-07-14 MED ORDER — ASPIRIN 81 MG PO CHEW
81.0000 mg | CHEWABLE_TABLET | Freq: Every day | ORAL | Status: DC
  Administered 2014-07-14 – 2014-07-16 (×3): 81 mg via ORAL
  Filled 2014-07-14 (×4): qty 1

## 2014-07-14 MED ORDER — EMTRICITABINE-TENOFOVIR DF 200-300 MG PO TABS
1.0000 | ORAL_TABLET | Freq: Every day | ORAL | Status: DC
  Administered 2014-07-14 – 2014-07-15 (×2): 1 via ORAL
  Filled 2014-07-14 (×3): qty 1

## 2014-07-14 MED ORDER — CYANOCOBALAMIN 1000 MCG/ML IJ SOLN
1000.0000 ug | Freq: Every day | INTRAMUSCULAR | Status: DC
  Administered 2014-07-14 – 2014-07-16 (×3): 1000 ug via SUBCUTANEOUS
  Filled 2014-07-14 (×3): qty 1

## 2014-07-14 MED ORDER — PREDNISONE 50 MG PO TABS
50.0000 mg | ORAL_TABLET | Freq: Two times a day (BID) | ORAL | Status: DC
  Administered 2014-07-14 – 2014-07-16 (×4): 50 mg via ORAL
  Filled 2014-07-14 (×6): qty 1

## 2014-07-14 MED ORDER — ONDANSETRON HCL 4 MG/2ML IJ SOLN: 4.0000 mg | Freq: Four times a day (QID) | INTRAMUSCULAR | Status: DC | PRN

## 2014-07-14 MED ORDER — ONDANSETRON HCL 4 MG PO TABS: 4.0000 mg | ORAL_TABLET | Freq: Four times a day (QID) | ORAL | Status: DC | PRN

## 2014-07-14 MED ORDER — ZOLPIDEM TARTRATE 5 MG PO TABS: 5.0000 mg | ORAL_TABLET | Freq: Every evening | ORAL | Status: DC | PRN

## 2014-07-14 MED ORDER — PANTOPRAZOLE SODIUM 40 MG PO TBEC
40.0000 mg | DELAYED_RELEASE_TABLET | Freq: Every day | ORAL | Status: DC
  Administered 2014-07-14 – 2014-07-16 (×3): 40 mg via ORAL
  Filled 2014-07-14 (×3): qty 1

## 2014-07-14 MED ORDER — OMEGA-3-ACID ETHYL ESTERS 1 G PO CAPS
1.0000 g | ORAL_CAPSULE | Freq: Every day | ORAL | Status: DC
  Administered 2014-07-14 – 2014-07-16 (×3): 1 g via ORAL
  Filled 2014-07-14 (×4): qty 1

## 2014-07-14 MED ORDER — ATORVASTATIN CALCIUM 10 MG PO TABS
10.0000 mg | ORAL_TABLET | Freq: Every day | ORAL | Status: DC
  Administered 2014-07-14 – 2014-07-15 (×2): 10 mg via ORAL
  Filled 2014-07-14 (×3): qty 1

## 2014-07-14 MED ORDER — DEXAMETHASONE SODIUM PHOSPHATE 4 MG/ML IJ SOLN
12.0000 mg | Freq: Once | INTRAMUSCULAR | Status: AC
  Administered 2014-07-14: 12 mg via INTRAVENOUS
  Filled 2014-07-14: qty 3

## 2014-07-14 MED ORDER — ACETAMINOPHEN 650 MG RE SUPP: 650.0000 mg | Freq: Four times a day (QID) | RECTAL | Status: DC | PRN

## 2014-07-14 MED ORDER — SODIUM CHLORIDE 0.9 % IV SOLN
INTRAVENOUS | Status: AC
  Administered 2014-07-14: 03:00:00 via INTRAVENOUS

## 2014-07-14 MED ORDER — POTASSIUM CHLORIDE CRYS ER 20 MEQ PO TBCR
40.0000 meq | EXTENDED_RELEASE_TABLET | Freq: Once | ORAL | Status: AC
  Administered 2014-07-14: 40 meq via ORAL
  Filled 2014-07-14: qty 2

## 2014-07-14 MED ORDER — CALCIUM CARBONATE 1250 (500 CA) MG PO TABS
600.0000 mg | ORAL_TABLET | Freq: Every day | ORAL | Status: DC
  Administered 2014-07-14 – 2014-07-16 (×3): 625 mg via ORAL
  Filled 2014-07-14 (×3): qty 0.5
  Filled 2014-07-14 (×2): qty 2

## 2014-07-14 MED ORDER — NEVIRAPINE 200 MG PO TABS
400.0000 mg | ORAL_TABLET | Freq: Every day | ORAL | Status: DC
  Administered 2014-07-14: 400 mg via ORAL
  Administered 2014-07-15: 200 mg via ORAL
  Filled 2014-07-14 (×4): qty 2

## 2014-07-14 MED ORDER — SODIUM CHLORIDE 0.9 % IV SOLN: Freq: Once | INTRAVENOUS | Status: DC

## 2014-07-14 MED ORDER — METOPROLOL SUCCINATE ER 25 MG PO TB24
25.0000 mg | ORAL_TABLET | Freq: Every day | ORAL | Status: DC
  Administered 2014-07-14: 25 mg via ORAL
  Filled 2014-07-14: qty 1

## 2014-07-14 NOTE — H&P (Addendum)
[facility] History and Physical  Theresa Morrison IRJ:[id number] DOB: 1955/10/28 DOA: 07/13/2014  Referring physician: ER physician. PCP: Horatio Pel, MD   Chief Complaint: Low hemoglobin.  HPI: Theresa Morrison is a 58 y.o. female with history of HIV and previous stroke and hyperlipidemia has been experiencing exertional shortness of breath with some chest tightness over the last 1 month. Patient was referred to the cardiologist and has had stress test which was negative as per the patient. Stress test was done last week. Since patient's symptoms was persistent patient had followed up with her infectious disease doctor yesterday and had blood work done which showed severe anemia. Patient was advised to come to the ER. Repeat blood work come from the patient has Implanon for fecal occult blood was negative. Patient's blood work showed macrocytic picture with mildly elevated bilirubin and LDH. Patient at this time on lying down is asymptomatic. Patient says he gets symptomatic on exertion only. Has had one episode of nausea vomiting and diarrhea 2 weeks ago. Patient is afebrile but stated that she was having fever 1 week ago while she was in the cardiologist's office. Chest x-ray done a week ago was unremarkable. Creatinine is mildly elevated from yesterday. Patient denies any blood in the stool or black stools. Patient has had previous history of hematuria but has not noticed any blood in the urine at this time. Patient states she also had a colonoscopy a few years ago which was unremarkable. Patient will be admitted for further management of her symptomatic anemia. Platelet counts and normal. Other than beta blockers patient has not been started on any new medications.   Review of Systems: As presented in the history of presenting illness, rest negative.  Past Medical History  Diagnosis Date  . HIV positive 1987  . Colon polyps     adenomatous  . CVA (cerebral vascular  accident)   . IBS (irritable bowel syndrome)   . Shingles   . Toxoplasmosis   . Hypercholesterolemia    Past Surgical History  Procedure Laterality Date  . Tonsillectomy    . Appendectomy  2001  . Knee arthroscopy Bilateral   . Shoulder surgery Right    Social History:  reports that she has never smoked. She has never used smokeless tobacco. She reports that she does not drink alcohol or use illicit drugs. Where does patient live home. Can patient participate in ADLs? Yes.  Allergies  Allergen Reactions  . Penicillins     REACTION: facial swelling  . Stavudine     REACTION: peripheral neuropathy  . Sulfamethoxazole-Trimethoprim     REACTION: hives    Family History:  Family History  Problem Relation Age of Onset  . Heart attack Father   . Breast cancer Mother     mets to lung and brain      Prior to Admission medications   Medication Sig Start Date End Date Taking? Authorizing Provider  aspirin 81 MG tablet Take 81 mg by mouth daily.     Yes Historical Provider, MD  atorvastatin (LIPITOR) 10 MG tablet Take 10 mg by mouth daily.   Yes Historical Provider, MD  beta carotene w/minerals (OCUVITE) tablet Take 2 tablets by mouth daily.   Yes Historical Provider, MD  calcium carbonate (OS-CAL) 600 MG TABS Take 600 mg by mouth daily.     Yes Historical Provider, MD  clopidogrel (PLAVIX) 75 MG tablet Take 75 mg by mouth daily.   Yes Historical Provider, MD  emtricitabine-tenofovir (TRUVADA)  200-300 MG per tablet Take 1 tablet by mouth daily.   Yes Historical Provider, MD  metoprolol succinate (TOPROL-XL) 25 MG 24 hr tablet Take 25 mg by mouth daily. 07/01/14  Yes Historical Provider, MD  nevirapine (VIRAMUNE) 200 MG tablet Take 400 mg by mouth daily.   Yes Historical Provider, MD  NITROSTAT 0.4 MG SL tablet Place 0.4 mg under the tongue every 5 (five) minutes as needed for chest pain.  07/01/14  Yes Historical Provider, MD  Omega-3 Fatty Acids (FISH OIL) 1000 MG CAPS Take 1 capsule  by mouth daily.    Yes Historical Provider, MD  zolpidem (AMBIEN) 10 MG tablet Take 1/2-1 tablet at bedtime as needed for trouble sleeping 01/14/13  Yes Michel Bickers, MD  clopidogrel (PLAVIX) 75 MG tablet TAKE 1 TABLET DAILY 07/26/13   Michel Bickers, MD  Nevirapine 400 MG TB24 TAKE 1 TABLET DAILY 03/13/14   Michel Bickers, MD  TRUVADA 200-300 MG per tablet TAKE 1 TABLET DAILY 01/27/14   Michel Bickers, MD    Physical Exam: Filed Vitals:   07/14/14 0200 07/14/14 0215 07/14/14 0230 07/14/14 0253  BP: 94/47 96/47 96/48  103/52  Pulse: 84 85 84 83  Temp:    98.7 F (37.1 C)  TempSrc:    Oral  Resp: 21 24 25 18   Height:    5\' 4"  (1.626 m)  Weight:    74.6 kg (164 lb 7.4 oz)  SpO2: 96% 97% 98% 98%     General:  Well-developed and nourished.  Eyes: Anicteric pallor present.  ENT: No discharge from the ears eyes nose more.  Neck: No mass felt.  Cardiovascular: S1-S2 heard.  Respiratory: No rhonchi or crepitations.  Abdomen: Soft nontender bowel sounds present.  Skin: Appears pale.  Musculoskeletal: No edema.  Psychiatric: Appears normal.  Neurologic: Alert and oriented to time place and person. Moves all extremities.  Labs on Admission:  Basic Metabolic Panel:  Recent Labs Lab 07/13/14 1720 07/14/14  NA 137 137  K 3.7 3.2*  CL 105 108  CO2 24 21  GLUCOSE 96 113*  BUN 17 17  CREATININE 1.10 1.18*  CALCIUM 8.8 8.8   Liver Function Tests:  Recent Labs Lab 07/13/14 1720 07/14/14  AST 33 37  ALT 11 15  ALKPHOS 132* 138*  BILITOT 2.9* 1.9*  PROT 6.0 5.8*  ALBUMIN 4.4 4.1    Recent Labs Lab 07/14/14  LIPASE 26   No results for input(s): AMMONIA in the last 168 hours. CBC:  Recent Labs Lab 07/13/14 1720 07/14/14  WBC 6.5 6.9  NEUTROABS 4.4 4.6  HGB 4.2* 4.0*  HCT 12.8* 12.7*  MCV 125.5* 129.6*  PLT 212 207   Cardiac Enzymes: No results for input(s): CKTOTAL, CKMB, CKMBINDEX, TROPONINI in the last 168 hours.  BNP (last 3 results) No results for  input(s): PROBNP in the last 8760 hours. CBG: No results for input(s): GLUCAP in the last 168 hours.  Radiological Exams on Admission: No results found.    Assessment/Plan Principal Problem:   Symptomatic anemia Active Problems:   Human immunodeficiency virus (HIV) disease   History of cardiovascular disorder   Macrocytic anemia   1. Symptomatic severe anemia, macrocytic picture and elevated LDH - at this time B-12 levels, folate levels are levels and ferritin levels, direct Coombs test, smear review have been ordered. 2 units of packed red blood cells have been ordered for transfusion. Follow CBC after transfusion. Will need hematology consult in a.m. Patient is presently afebrile and platelet  counts are within acceptable limits. Patient's fecal occult blood is negative. Patient has had a colonoscopy in 2011 which showed tubular adenoma. 2. Mild renal failure probably acute - probably from anemia and dehydration. Check urinalysis. Continue gentle hydration and I think patient's creatinine will improve with transfusion. Closely follow metabolic panel. 3. HIV - continue antiretroviral therapy. Patient's last CD4 count was around 330 in June 2015. And has been compliant with her medications. 4. History of stroke - on antiplatelet agents. 5. Hyperlipidemia - on statins.  Addendum - I have discussed with on-call oncologist Dr. Jana Hakim who will be seeing patient in consult. Dr. Jana Hakim has advised to start patient on vitamin B-12 1000 g subcutaneous daily for now and folic acid and they will be seeing patient in consult for further recommendations.  Code Status: Full code.  Family Communication: Patient's husband at the bedside.  Disposition Plan: Admit to inpatient.    Aamina Skiff N. [facility] Pager [phone].  If 7PM-7AM, please contact night-coverage [email or web address] Password [email or web address] 07/14/2014, 3:06 AM

## 2014-07-14 NOTE — Consult Note (Signed)
[place]  Telephone:(336) [phone] Fax:(336) [phone]     ID: Theresa Morrison DOB: 1955-09-07  MR#: [id number]  YQI#:[id number]  Patient Care Team: Horatio Pel, MD as PCP - General (Internal Medicine) Michel Bickers, MD as PCP - Infectious Diseases (Infectious Diseases) PCP: Horatio Pel, MD GYN: Everlene Farrier MD SU:  OTHER MD: Wynona Dove MD, Scarlette Shorts MD, jOHN cAMPBELL md  CHIEF COMPLAINT: warm antibody hemolytic anemia  CURRENT TREATMENT: steroids   HISTORY OF PRESENT ILLNESS: Theresa Morrison saw he gynecologist in Oct 2015 for rouotine f/u and was found to be "a little anemic," according to the patient. She was started on iron, which she tolerated poorly. By late Nov 2015 she was weaker, more SOB and having palpitations. She was referred to cardiology and tells me she had a stress test which was "fine." As the symptoms persisted she saw Dr Megan Salon, her ID specialist, and among the labs he obtained the CBC showed a Hb of 4.2, MCV 125, but normal platelets and white cells. C-Met showed a t bil of 2.9 but normal creatinine and electrolytes. She was directed to the ED and was admnitted last night, 07/13/2014, with additional workup showing an LDH of 719, reticulocyte count 22% and a positive DAT. Hematology was consulted to direct treatment.  INTERVAL HISTORY: I met with the patient and her husband in her hospital room 07/14/2014.  REVIEW OF SYSTEMS: She denies fever, rash, overt bleeding, or adenopathy. There have been no medication changes. Her HIV positivity is well-controlled and her CVA left no significant residuals excepts perhaps some memory problems. A detailed ROS today was otherwise noncontributory except as noted  PAST MEDICAL HISTORY: Past Medical History  Diagnosis Date  . HIV positive 1987  . Colon polyps     adenomatous  . CVA (cerebral vascular accident)   . IBS (irritable bowel syndrome)   . Shingles   . Toxoplasmosis   .  Hypercholesterolemia     PAST SURGICAL HISTORY: Past Surgical History  Procedure Laterality Date  . Tonsillectomy    . Appendectomy  2001  . Knee arthroscopy Bilateral   . Shoulder surgery Right     FAMILY HISTORY Family History  Problem Relation Age of Onset  . Heart attack Father   . Breast cancer Mother     mets to lung and brain  Father died from MI age 67; mother died from metastatic cancer (breast? Lung?) age 90. The patient has one sister, three brothers. There is no opther history of cancer or blood problems in the family  GYNECOLOGIC HISTORY:  No LMP recorded. Patient is postmenopausal. Menarche age 9, first live birth age 68, she is GXP2, LMP 2010, she did not take HRT  SOCIAL HISTORY:  Homemaker. Husband of 64 years is a Dealer. Daughter Apolonio Schneiders works as a Product manager, son Ovid Curd in a warehouse, both living at home with the patient. No grandchildren. She attends a [employer] DIRECTIVES: not in place   HEALTH MAINTENANCE: History  Substance Use Topics  . Smoking status: Never Smoker   . Smokeless tobacco: Never Used  . Alcohol Use: No     Colonoscopy:  PAP:  Bone density:  Lipid panel:  Allergies  Allergen Reactions  . Penicillins     REACTION: facial swelling  . Stavudine     REACTION: peripheral neuropathy  . Sulfamethoxazole-Trimethoprim     REACTION: hives    Current Facility-Administered Medications  Medication Dose Route Frequency Provider Last Rate  Last Dose  . 0.9 %  sodium chloride infusion   Intravenous Continuous Rise Patience, MD 75 mL/hr at 07/14/14 0316    . acetaminophen (TYLENOL) tablet 650 mg  650 mg Oral Q6H PRN Rise Patience, MD       Or  . acetaminophen (TYLENOL) suppository 650 mg  650 mg Rectal Q6H PRN Rise Patience, MD      . aspirin chewable tablet 81 mg  81 mg Oral Daily Rise Patience, MD      . atorvastatin (LIPITOR) tablet 10 mg  10 mg Oral Daily Rise Patience, MD      . calcium carbonate (OS-CAL - dosed in mg of elemental calcium) tablet 625 mg  625 mg Oral Daily Rise Patience, MD      . clopidogrel (PLAVIX) tablet 75 mg  75 mg Oral Daily Rise Patience, MD      . cyanocobalamin ((VITAMIN B-12)) injection 1,000 mcg  1,000 mcg Subcutaneous Daily Rise Patience, MD      . emtricitabine-tenofovir (TRUVADA) 200-300 MG per tablet 1 tablet  1 tablet Oral Daily Rise Patience, MD      . folic acid (FOLVITE) tablet 1 mg  1 mg Oral Daily Rise Patience, MD      . metoprolol succinate (TOPROL-XL) 24 hr tablet 25 mg  25 mg Oral Daily Rise Patience, MD      . nevirapine [facility]) tablet 400 mg  400 mg Oral Daily Rise Patience, MD      . nitroGLYCERIN (NITROSTAT) SL tablet 0.4 mg  0.4 mg Sublingual Q5 min PRN Rise Patience, MD      . omega-3 acid ethyl esters (LOVAZA) capsule 1 g  1 g Oral Daily Rise Patience, MD      . ondansetron [facility]) tablet 4 mg  4 mg Oral Q6H PRN Rise Patience, MD       Or  . ondansetron (ZOFRAN) injection 4 mg  4 mg Intravenous Q6H PRN Rise Patience, MD      . zolpidem (AMBIEN) tablet 5 mg  5 mg Oral QHS PRN Rise Patience, MD        OBJECTIVE: middle aged white woman examined in bed Filed Vitals:   07/14/14 0555  BP: 95/50  Pulse: 80  Temp: 99.6 F (37.6 C)  Resp: 19     Body mass index is 28.22 kg/(m^2).    ECOG FS:2 - Symptomatic, <50% confined to bed   Lymphatic: No cervical or supraclavicular adenopathy, no axillary adenopathy Lungs no rales or rhonchi, good excursion bilaterally Heart regular rate and rhythm, no murmur appreciated Abd soft, nontender, positive bowel sounds Neuro: non-focal, well-oriented, appropriate affect Breasts: deferred   LAB RESULTS:  CMP     Component Value Date/Time   NA 138 07/14/2014 0442   K 3.4* 07/14/2014 0442   CL 108 07/14/2014 0442   CO2 20 07/14/2014 0442   GLUCOSE 116* 07/14/2014 0442   BUN 16  07/14/2014 0442   CREATININE 1.25* 07/14/2014 0442   CREATININE 1.10 07/13/2014 1720   CALCIUM 8.4 07/14/2014 0442   PROT 5.0* 07/14/2014 0442   ALBUMIN 3.5 07/14/2014 0442   AST 33 07/14/2014 0442   ALT 12 07/14/2014 0442   ALKPHOS 114 07/14/2014 0442   BILITOT 1.6* 07/14/2014 0442   GFRNONAA 47* 07/14/2014 0442   GFRNONAA 55* 12/20/2011 1029   GFRAA 54* 07/14/2014 0442   GFRAA 63 12/20/2011 1029  INo results found for: SPEP, UPEP  Lab Results  Component Value Date   WBC 4.9 07/14/2014   NEUTROABS 3.3 07/14/2014   HGB 3.2* 07/14/2014   HCT 10.1* 07/14/2014   MCV 131.2* 07/14/2014   PLT 176 07/14/2014    _0 @  No results found for: LABCA2  No components found for: [id number]   Recent Labs Lab 07/14/14  INR 1.19    Urinalysis No results found for: COLORURINE, APPEARANCEUR, LABSPEC, PHURINE, GLUCOSEU, HGBUR, BILIRUBINUR, KETONESUR, PROTEINUR, UROBILINOGEN, NITRITE, LEUKOCYTESUR  STUDIES: Dg Chest 2 View  07/01/2014   CLINICAL DATA:  Shortness of breath x3 -4 weeks.  EXAM: CHEST  2 VIEW  COMPARISON:  None.  FINDINGS: No acute cardiopulmonary disease. No pleural effusion or pneumothorax. No acute bony abnormality .  IMPRESSION: No acute abnormality.   Electronically Signed   By: Baring   On: 07/01/2014 14:32   REVIEW OF BLOOD FILM:  Shows no artefactual platelet clumps, no left shift in the white cell series, no hypersegmentation; red cells show no schistocytes, no "bite cells," no tailed poikilocytes, and mild/moderate spherocytes, but significant polychromasia and many nucleated RBCs  ASSESSMENT: 58 y.o. 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 They understand the pathophysiology of her condition, in which the immune system mistakenly  destroys red cells. We do not know the cause of this "mistake." In general we treat this problem by "telling the immune system to back off," which we do with steroids. I have discussed this with Dr Megan Salon who handles the patient's history of HIV. He tells me the patient's viral load has been very well controlled for years and we can proceed.  Accordingly I am starting her on steroids. Hopefully we will see a rapid response (over the next few days). Once the Hb stabilizes/ steadily rises she can be discharged. She understands she will be on steroids for a long tapering course. If she does not respond to steroids or fails the taper she would need splenectomy.  We also discussed the possible side effects, toxicities and complications of steroids and note that this patient has had shingles x2 in the past. She will need "triple vaccination" given the possibility of eventual splenectomy and a bone density-- these can be done as outpatient.  The patient has a good understanding of the overall plan. She agrees with it. She knows the goal of treatment in her case is cure. I will follow with you through discharge and set her up for follow-up with my partner at [facility], Dr Whitney Muse as outpatient.  Chauncey Cruel, MD   07/14/2014 8:36 AM Medical Oncology and Hematology [facility] [address] [place], [place] [address] Tel. [phone]    Fax. [phone]

## 2014-07-14 NOTE — Progress Notes (Signed)
Utilization Review Completed.Donne Anon T12/22/2015

## 2014-07-14 NOTE — Progress Notes (Signed)
[facility] PROGRESS NOTE  Theresa Morrison QHU:[id number] DOB: 1956/03/03 DOA: 07/13/2014 PCP: Horatio Pel, MD  Assessment/Plan: 1. Warm body hemolytic anemia. -Hematology consulted. -Labs showing an elevated LDH of 719 with reticulocyte count of 22 and positive Coombs test. -Hematology starting prednisone 50 mg by mouth twice a day with meals. -Will monitor response, follow-up on repeat CBC.  2.  Profound anemia -Patient presenting with fatigue, shortness breath, chest pain, having hemoglobin of 3.2. -Like secondary to hemolytic anemia as she was started on steroid treatment by hematology -Continue transfusion with packed red blood cells -Repeat lab work in a.m.  3.  Acute kidney injury -Creatinine trending up to 1.25 from 1.1, I suspect secondary to severe anemia -Patient receiving blood transfusion.  -I anticipate improvement in kidney function with transfusion of blood and volume  4.  Hypertension. -Given presence of hypotension and profound anemia will discontinue metoprolol for now  5. HIV -Stable, continue her anti-retroviral therapy   Code Status: Full code Family Communication: Spoke with her husband who was present at bedside Disposition Plan: Plan for blood transfusion, continue steroids, anticipate discharge home when medically stable   Consultants:  Hematology  Procedures:  Blood transfusion   HPI/Subjective: Patient is a pleasant 58 year old female with a history of HIV, admitted to the medicine service on 07/14/2014, presenting with complaints of exertional shortness of breath and chest tightness. Lab work performed that her infectious disease specialist office revealed profound anemia with hemoglobin of 4.2 and was referred to the emergency department. She was typed and cross and transfuse 2 units of packed red blood cells. Patient was evaluated by Dr. Delrae Alfred hematology. Workup revealed an elevated LDH of 719 with reticulocyte  count of 22% and a Coombs test positive for IgG and complement. Patient diagnosed with warm body hemolytic anemia and started on steroids.  Objective: Filed Vitals:   07/14/14 1315  BP: 105/55  Pulse: 71  Temp: 98.4 F (36.9 C)  Resp: 18    Intake/Output Summary (Last 24 hours) at 07/14/14 1510 Last data filed at 07/14/14 1315  Gross per 24 hour  Intake    335 ml  Output      0 ml  Net    335 ml   Filed Weights   07/14/14 0253  Weight: 74.6 kg (164 lb 7.4 oz)    Exam:   General:  Pale appearing, generalized weakness, ill appearing  Cardiovascular: Regular rate and rhythm normal S1-S2  Respiratory: Clear to auscultation bilaterally  Abdomen: Soft nontender nondistended  Musculoskeletal: No edema  Data Reviewed: Basic Metabolic Panel:  Recent Labs Lab 07/13/14 1720 07/14/14 07/14/14 0442  NA 137 137 138  K 3.7 3.2* 3.4*  CL 105 108 108  CO2 24 21 20   GLUCOSE 96 113* 116*  BUN 17 17 16   CREATININE 1.10 1.18* 1.25*  CALCIUM 8.8 8.8 8.4   Liver Function Tests:  Recent Labs Lab 07/13/14 1720 07/14/14 07/14/14 0442  AST 33 37 33  ALT 11 15 12   ALKPHOS 132* 138* 114  BILITOT 2.9* 1.9* 1.6*  PROT 6.0 5.8* 5.0*  ALBUMIN 4.4 4.1 3.5    Recent Labs Lab 07/14/14  LIPASE 26   No results for input(s): AMMONIA in the last 168 hours. CBC:  Recent Labs Lab 07/13/14 1720 07/14/14 07/14/14 0442  WBC 6.5 6.9 4.9  NEUTROABS 4.4 4.6 3.3  HGB 4.2* 4.0* 3.2*  HCT 12.8* 12.7* 10.1*  MCV 125.5* 129.6* 131.2*  PLT 212 207 176   Cardiac  Enzymes:  Recent Labs Lab 07/14/14 0442  TROPONINI <0.03   BNP (last 3 results) No results for input(s): PROBNP in the last 8760 hours. CBG: No results for input(s): GLUCAP in the last 168 hours.  Recent Results (from the past 240 hour(s))  Blood culture (routine single)     Status: None (Preliminary result)   Collection Time: 07/13/14  5:20 PM  Result Value Ref Range Status   Preliminary Report Blood Culture  received; No Growth to date;  Preliminary   Preliminary Report Culture will be held for 5 days before issuing  Preliminary   Preliminary Report a Final Negative report.  Preliminary  Blood culture (routine single)     Status: None (Preliminary result)   Collection Time: 07/13/14  5:20 PM  Result Value Ref Range Status   Preliminary Report Blood Culture received; No Growth to date;  Preliminary   Preliminary Report Culture will be held for 5 days before issuing  Preliminary   Preliminary Report a Final Negative report.  Preliminary     Studies: No results found.  Scheduled Meds: . aspirin  81 mg Oral Daily  . atorvastatin  10 mg Oral Daily  . calcium carbonate  625 mg Oral Daily  . clopidogrel  75 mg Oral Daily  . cyanocobalamin  1,000 mcg Subcutaneous Daily  . emtricitabine-tenofovir  1 tablet Oral Daily  . folic acid  1 mg Oral Daily  . metoprolol succinate  25 mg Oral Daily  . nevirapine  400 mg Oral Daily  . omega-3 acid ethyl esters  1 g Oral Daily  . pantoprazole  40 mg Oral Daily  . predniSONE  50 mg Oral BID WC   Continuous Infusions: . sodium chloride 75 mL/hr at 07/14/14 1325    Principal Problem:   Symptomatic anemia Active Problems:   Human immunodeficiency virus (HIV) disease   History of cardiovascular disorder   Macrocytic anemia    Time spent:     Kelvin Cellar  [facility] Pager [phone] 7PM-7AM, please contact night-coverage at [email or web address], password [facility] 07/14/2014, 3:10 PM  LOS: 1 day

## 2014-07-14 NOTE — Progress Notes (Signed)
Transfusion of 1 Unit of O positive RBCs was initiated at 0523. No reactions were noted. Total volume infused was 329 mL. VS at baseline throughout infusion.

## 2014-07-14 NOTE — Progress Notes (Signed)
Lab called critical Hgb 3.2. Physician notified.

## 2014-07-14 NOTE — ED Notes (Signed)
Admitting physician at bedside

## 2014-07-14 NOTE — Progress Notes (Signed)
Patient arrived to 6N27 from the ED, accompanied by family. Pt A/O X4. Pt oriented to room and equipment. Initial assessment performed. NAD noted. Generalized pallor noted. Will monitor closely overnight.

## 2014-07-14 NOTE — ED Notes (Signed)
Pt denies CP or SOB at this time, speaking in full sentences.  Oxygen applied to pt.  Pt denies being dizzy or lightheaded.  2nd IV started.

## 2014-07-15 ENCOUNTER — Other Ambulatory Visit: Payer: Self-pay | Admitting: Oncology

## 2014-07-15 DIAGNOSIS — D589 Hereditary hemolytic anemia, unspecified: Secondary | ICD-10-CM | POA: Insufficient documentation

## 2014-07-15 DIAGNOSIS — D599 Acquired hemolytic anemia, unspecified: Secondary | ICD-10-CM

## 2014-07-15 LAB — BASIC METABOLIC PANEL
Anion gap: 5 (ref 5–15)
BUN: 13 mg/dL (ref 6–23)
CALCIUM: 8.5 mg/dL (ref 8.4–10.5)
CO2: 21 mmol/L (ref 19–32)
CREATININE: 1.04 mg/dL (ref 0.50–1.10)
Chloride: 115 mEq/L — ABNORMAL HIGH (ref 96–112)
GFR calc Af Amer: 68 mL/min — ABNORMAL LOW (ref >90)
GFR, EST NON AFRICAN AMERICAN: 58 mL/min — AB (ref >90)
GLUCOSE: 131 mg/dL — AB (ref 70–99)
Potassium: 4.4 mmol/L (ref 3.5–5.1)
Sodium: 141 mmol/L (ref 135–145)

## 2014-07-15 LAB — CBC WITH DIFFERENTIAL/PLATELET
BASOS ABS: 0 10*3/uL (ref 0.0–0.1)
Basophils Relative: 0 % (ref 0–1)
EOS ABS: 0 10*3/uL (ref 0.0–0.7)
Eosinophils Relative: 0 % (ref 0–5)
HEMATOCRIT: 18.7 % — AB (ref 36.0–46.0)
Hemoglobin: 6.3 g/dL — CL (ref 12.0–15.0)
LYMPHS ABS: 0.5 10*3/uL — AB (ref 0.7–4.0)
Lymphocytes Relative: 9 % — ABNORMAL LOW (ref 12–46)
MCH: 36.8 pg — AB (ref 26.0–34.0)
MCHC: 33.7 g/dL (ref 30.0–36.0)
MCV: 109.4 fL — ABNORMAL HIGH (ref 78.0–100.0)
MONO ABS: 0.2 10*3/uL (ref 0.1–1.0)
Monocytes Relative: 4 % (ref 3–12)
NEUTROS ABS: 4.7 10*3/uL (ref 1.7–7.7)
Neutrophils Relative %: 87 % — ABNORMAL HIGH (ref 43–77)
Platelets: 169 10*3/uL (ref 150–400)
RBC: 1.71 MIL/uL — ABNORMAL LOW (ref 3.87–5.11)
RDW: 30.8 % — AB (ref 11.5–15.5)
WBC: 5.4 10*3/uL (ref 4.0–10.5)

## 2014-07-15 LAB — GLUCOSE, CAPILLARY
Glucose-Capillary: 95 mg/dL (ref 70–99)
Glucose-Capillary: 95 mg/dL (ref 70–99)
Glucose-Capillary: 123 mg/dL — ABNORMAL HIGH (ref 70–99)
Glucose-Capillary: 112 mg/dL — ABNORMAL HIGH (ref 70–99)

## 2014-07-15 LAB — T-HELPER CELL (CD4) - (RCID CLINIC ONLY)
CD4 T CELL ABS: 470 /uL (ref 400–2700)
CD4 T CELL HELPER: 40 % (ref 33–55)

## 2014-07-15 LAB — LACTATE DEHYDROGENASE: LDH: 659 U/L — ABNORMAL HIGH (ref 94–250)

## 2014-07-15 LAB — HIV-1 RNA QUANT-NO REFLEX-BLD: HIV-1 RNA Quant, Log: <1.3 {Log} (ref <1.30)

## 2014-07-15 MED ORDER — NEVIRAPINE ER 400 MG PO TB24: 400.0000 mg | ORAL_TABLET | Freq: Every day | ORAL | Status: DC

## 2014-07-15 MED ORDER — OCUVITE-LUTEIN PO CAPS
1.0000 | ORAL_CAPSULE | Freq: Every day | ORAL | Status: DC
  Administered 2014-07-15 – 2014-07-16 (×2): 1 via ORAL
  Filled 2014-07-15 (×2): qty 1

## 2014-07-15 MED ORDER — INSULIN ASPART 100 UNIT/ML ~~LOC~~ SOLN
0.0000 [IU] | Freq: Three times a day (TID) | SUBCUTANEOUS | Status: DC
  Administered 2014-07-15: 1 [IU] via SUBCUTANEOUS

## 2014-07-15 MED ORDER — NEVIRAPINE 200 MG PO TABS
200.0000 mg | ORAL_TABLET | Freq: Two times a day (BID) | ORAL | Status: AC
  Administered 2014-07-16: 200 mg via ORAL
  Filled 2014-07-15: qty 1

## 2014-07-15 NOTE — Progress Notes (Signed)
[facility] PROGRESS NOTE  Theresa Morrison GEX:[id number] DOB: [birth year] DOA: 07/13/2014 PCP: Horatio Pel, MD  Assessment/Plan: 1. Warm body hemolytic anemia. -Hematology consulted. -Labs showing an elevated LDH of 719 with reticulocyte count of 22 and positive Coombs test. -Patient showing significant clinical improvement today, with Hg trending up to 6.3, patient ambulating down the hallway -Continue Prednisone 50 mg PO BID  2.  Profound anemia -Patient presenting with fatigue, shortness breath, chest pain, having hemoglobin of 3.2. -Like secondary to hemolytic anemia as she was started on steroid treatment by hematology -Hg trending up to 6.3 on am labs, he is likely responding to steroid treatment  3.  Acute kidney injury -Creatinine trending up to 1.25 from 1.1, I suspect secondary to severe anemia -Kidney function improving, labs showing Creatinine of 1.04  4.  Hypertension. -Blood pressures low normal, will continue holding beta blocker  5. HIV -Stable, continue her anti-retroviral therapy   Code Status: Full code Family Communication: Spoke with her husband who was present at bedside Disposition Plan: Plan for blood transfusion, continue steroids, anticipate discharge home when medically stable   Consultants:  Hematology  Procedures:  Blood transfusion   HPI/Subjective: Patient is a pleasant 58 year old female with a history of HIV, admitted to the medicine service on 07/14/2014, presenting with complaints of exertional shortness of breath and chest tightness. Lab work performed that her infectious disease specialist office revealed profound anemia with hemoglobin of 4.2 and was referred to the emergency department. She was typed and cross and transfuse 2 units of packed red blood cells. Patient was evaluated by Dr. Delrae Alfred hematology. Workup revealed an elevated LDH of 719 with reticulocyte count of 22% and a Coombs test positive for  IgG and complement. Patient diagnosed with warm body hemolytic anemia and started on steroids.  Objective: Filed Vitals:   07/15/14 0540  BP: 102/55  Pulse: 73  Temp: 97.5 F (36.4 C)  Resp: 17    Intake/Output Summary (Last 24 hours) at 07/15/14 1357 Last data filed at 07/15/14 0541  Gross per 24 hour  Intake      0 ml  Output    850 ml  Net   -850 ml   Filed Weights   07/14/14 0253  Weight: 74.6 kg (164 lb 7.4 oz)    Exam:   General:  Appears better  Cardiovascular: Regular rate and rhythm normal S1-S2  Respiratory: Clear to auscultation bilaterally  Abdomen: Soft nontender nondistended  Musculoskeletal: No edema  Data Reviewed: Basic Metabolic Panel:  Recent Labs Lab 07/13/14 1720 07/14/14 07/14/14 0442 07/15/14 0328  NA 137 137 138 141  K 3.7 3.2* 3.4* 4.4  CL 105 108 108 115*  CO2 24 21 20 21   GLUCOSE 96 113* 116* 131*  BUN 17 17 16 13   CREATININE 1.10 1.18* 1.25* 1.04  CALCIUM 8.8 8.8 8.4 8.5   Liver Function Tests:  Recent Labs Lab 07/13/14 1720 07/14/14 07/14/14 0442  AST 33 37 33  ALT 11 15 12   ALKPHOS 132* 138* 114  BILITOT 2.9* 1.9* 1.6*  PROT 6.0 5.8* 5.0*  ALBUMIN 4.4 4.1 3.5    Recent Labs Lab 07/14/14  LIPASE 26   No results for input(s): AMMONIA in the last 168 hours. CBC:  Recent Labs Lab 07/13/14 1720 07/14/14 07/14/14 0442 07/15/14 0328  WBC 6.5 6.9 4.9 5.4  NEUTROABS 4.4 4.6 3.3 4.7  HGB 4.2* 4.0* 3.2* 6.3*  HCT 12.8* 12.7* 10.1* 18.7*  MCV 125.5* 129.6* 131.2* 109.4*  PLT 212 207 176 169   Cardiac Enzymes:  Recent Labs Lab 07/14/14 0442  TROPONINI <0.03   BNP (last 3 results) No results for input(s): PROBNP in the last 8760 hours. CBG:  Recent Labs Lab 07/15/14 0830 07/15/14 1223  GLUCAP 95 95    Recent Results (from the past 240 hour(s))  Blood culture (routine single)     Status: None (Preliminary result)   Collection Time: 07/13/14  5:20 PM  Result Value Ref Range Status   Preliminary  Report Blood Culture received; No Growth to date;  Preliminary   Preliminary Report Culture will be held for 5 days before issuing  Preliminary   Preliminary Report a Final Negative report.  Preliminary  Blood culture (routine single)     Status: None (Preliminary result)   Collection Time: 07/13/14  5:20 PM  Result Value Ref Range Status   Preliminary Report Blood Culture received; No Growth to date;  Preliminary   Preliminary Report Culture will be held for 5 days before issuing  Preliminary   Preliminary Report a Final Negative report.  Preliminary     Studies: No results found.  Scheduled Meds: . aspirin  81 mg Oral Daily  . atorvastatin  10 mg Oral Daily  . calcium carbonate  625 mg Oral Daily  . clopidogrel  75 mg Oral Daily  . cyanocobalamin  1,000 mcg Subcutaneous Daily  . emtricitabine-tenofovir  1 tablet Oral Daily  . folic acid  1 mg Oral Daily  . insulin aspart  0-9 Units Subcutaneous TID WC  . nevirapine  400 mg Oral Daily  . omega-3 acid ethyl esters  1 g Oral Daily  . pantoprazole  40 mg Oral Daily  . predniSONE  50 mg Oral BID WC   Continuous Infusions:    Principal Problem:   Symptomatic anemia Active Problems:   Human immunodeficiency virus (HIV) disease   History of cardiovascular disorder   Macrocytic anemia    Time spent: 30 min    Kelvin Cellar  [facility] Pager [phone] 7PM-7AM, please contact night-coverage at [email or web address], password [facility] 07/15/2014, 1:57 PM  LOS: 2 days

## 2014-07-15 NOTE — Progress Notes (Signed)
Theresa Morrison   DOB:September 29, 1955   LP#:[id number]   DZH#:[id number]  Subjective: feeling "much better"; not getting OOB; denies SOB, palpitations; husband in room   Objective: middle aged White woman exmained in bed Filed Vitals:   07/15/14 0540  BP: 102/55  Pulse: 73  Temp: 97.5 F (36.4 C)  Resp: 17    Body mass index is 28.22 kg/(m^2).  Intake/Output Summary (Last 24 hours) at 07/15/14 0752 Last data filed at 07/15/14 0541  Gross per 24 hour  Intake 1096.25 ml  Output    850 ml  Net 246.25 ml     No peripheral adenopathy  Lungs clear -- auscultated anterolaterlaly  Heart regular rate and rhythm  Abdomen benign  Neuro nonfocal  Breast exam: deferred  CBG (last 3)  No results for input(s): GLUCAP in the last 72 hours.   Labs:  Lab Results  Component Value Date   WBC 5.4 07/15/2014   HGB 6.3* 07/15/2014   HCT 18.7* 07/15/2014   MCV 109.4* 07/15/2014   PLT 169 07/15/2014   NEUTROABS 4.7 07/15/2014    @LASTCHEMISTRY @  Urine Studies No results for input(s): UHGB, CRYS in the last 72 hours.  Invalid input(s): UACOL, UAPR, USPG, UPH, UTP, UGL, UKET, UBIL, UNIT, UROB, ULEU, UEPI, UWBC, URBC, Theresa Morrison, Theresa Morrison  Basic Metabolic Panel:  Recent Labs Lab 07/13/14 1720 07/14/14 07/14/14 0442 07/15/14 0328  NA 137 137 138 141  K 3.7 3.2* 3.4* 4.4  CL 105 108 108 115*  CO2 24 21 20 21   GLUCOSE 96 113* 116* 131*  BUN 17 17 16 13   CREATININE 1.10 1.18* 1.25* 1.04  CALCIUM 8.8 8.8 8.4 8.5   GFR Estimated Creatinine Clearance: 59.1 mL/min (by C-G formula based on Cr of 1.04). Liver Function Tests:  Recent Labs Lab 07/13/14 1720 07/14/14 07/14/14 0442  AST 33 37 33  ALT 11 15 12   ALKPHOS 132* 138* 114  BILITOT 2.9* 1.9* 1.6*  PROT 6.0 5.8* 5.0*  ALBUMIN 4.4 4.1 3.5    Recent Labs Lab 07/14/14  LIPASE 26   No results for input(s): AMMONIA in the last 168 hours. Coagulation profile  Recent Labs Lab 07/14/14  INR 1.19    CBC:  Recent  Labs Lab 07/13/14 1720 07/14/14 07/14/14 0442 07/15/14 0328  WBC 6.5 6.9 4.9 5.4  NEUTROABS 4.4 4.6 3.3 4.7  HGB 4.2* 4.0* 3.2* 6.3*  HCT 12.8* 12.7* 10.1* 18.7*  MCV 125.5* 129.6* 131.2* 109.4*  PLT 212 207 176 169   Cardiac Enzymes:  Recent Labs Lab 07/14/14 0442  TROPONINI <0.03   BNP: Invalid input(s): POCBNP CBG: No results for input(s): GLUCAP in the last 168 hours. D-Dimer No results for input(s): DDIMER in the last 72 hours. Hgb A1c No results for input(s): HGBA1C in the last 72 hours. Lipid Profile No results for input(s): CHOL, HDL, LDLCALC, TRIG, CHOLHDL, LDLDIRECT in the last 72 hours. Thyroid function studies  Recent Labs  07/13/14 1720  TSH 1.264   Anemia work up  Recent Labs  07/14/14  VITAMINB12 246  FOLATE 12.0  FERRITIN 205  TIBC 285  IRON 236*  RETICCTPCT >23.0*   Microbiology Recent Results (from the past 240 hour(s))  Blood culture (routine single)     Status: None (Preliminary result)   Collection Time: 07/13/14  5:20 PM  Result Value Ref Range Status   Preliminary Report Blood Culture received; No Growth to date;  Preliminary   Preliminary Report Culture will be held for 5 days  before issuing  Preliminary   Preliminary Report a Final Negative report.  Preliminary  Blood culture (routine single)     Status: None (Preliminary result)   Collection Time: 07/13/14  5:20 PM  Result Value Ref Range Status   Preliminary Report Blood Culture received; No Growth to date;  Preliminary   Preliminary Report Culture will be held for 5 days before issuing  Preliminary   Preliminary Report a Final Negative report.  Preliminary      Studies:  No results found.  Assessment: 58 y.o. 58 y.o. Theresa Morrison, Theresa Morrison woman admitted 07/13/2014 with a Hb of 3.2, reticulocytes 23%, LDH 719, tbil 2.9, haptoglobin <25 and Coombs positive for IgG and complement-- in short with a warm antibody hemolytic anemia  (1) received emergent PRBCs x2 07/14/2014  (2)  prednisone 50 mg po BID started 07/14/2014  (a) protonix added 07/14/2014  (b) received B12 1000 sQ 07/14/2014, folate supplementation started same day; ferriitin, B12 and folate WNL at baseline  (b) cbg being checked QID  (3) HIV positivity: on nevirapine and truvada with excellent long-term control--followed by Dr Theresa Morrison  (a) HIV RNA, T-helper cell ct pending    Plan: Theresa Morrison is tolerating steroids well so far. Hb is up and LDH coming down. I expect her Hb will continue to rise next 24-72 hours. I have written for cbg monitoring to make sure she does not develop glucose intolerance. I have encouraged her to start getting OOB and to ambulate w assistance.  Hopefully she will be ready for d/c soon (when Hb steadily rising and patient asymptomatic from her anemia). At discharge she should be on prednisone 50 mg po BIDcc and omeprazole 40 mg po daily. Would continue folate 1 mg po as well. I have discussed her case with Dr Theresa Morrison, my hematology partner at [facility], and I will arrange for f/u of Theresa Morrison at that clinic next week.  I will be unavailable until 07/20/2014. Please consult my partners with any questions or concerns   Theresa Cruel, MD 07/15/2014  7:52 AM Medical Oncology and Hematology [facility] [address] [place], [place] [address] Tel. [phone]    Fax. [phone]

## 2014-07-15 NOTE — Progress Notes (Signed)
MD paged regarding Hemoglobin of 6.3. Theresa Morrison

## 2014-07-16 ENCOUNTER — Telehealth: Payer: Self-pay | Admitting: Oncology

## 2014-07-16 LAB — BASIC METABOLIC PANEL
ANION GAP: 8 (ref 5–15)
BUN: 18 mg/dL (ref 6–23)
CALCIUM: 8.8 mg/dL (ref 8.4–10.5)
CO2: 20 mmol/L (ref 19–32)
Chloride: 111 mEq/L (ref 96–112)
Creatinine, Ser: 1.04 mg/dL (ref 0.50–1.10)
GFR calc Af Amer: 68 mL/min — ABNORMAL LOW (ref >90)
GFR, EST NON AFRICAN AMERICAN: 58 mL/min — AB (ref >90)
Glucose, Bld: 98 mg/dL (ref 70–99)
Potassium: 4.1 mmol/L (ref 3.5–5.1)
Sodium: 139 mmol/L (ref 135–145)

## 2014-07-16 LAB — CBC WITH DIFFERENTIAL/PLATELET
BASOS ABS: 0 10*3/uL (ref 0.0–0.1)
Basophils Relative: 0 % (ref 0–1)
Eosinophils Absolute: 0 10*3/uL (ref 0.0–0.7)
Eosinophils Relative: 0 % (ref 0–5)
HCT: 20 % — ABNORMAL LOW (ref 36.0–46.0)
Hemoglobin: 6.6 g/dL — CL (ref 12.0–15.0)
LYMPHS PCT: 12 % (ref 12–46)
Lymphs Abs: 0.9 10*3/uL (ref 0.7–4.0)
MCH: 37.5 pg — ABNORMAL HIGH (ref 26.0–34.0)
MCHC: 33 g/dL (ref 30.0–36.0)
MCV: 113.6 fL — ABNORMAL HIGH (ref 78.0–100.0)
MONOS PCT: 5 % (ref 3–12)
Monocytes Absolute: 0.4 10*3/uL (ref 0.1–1.0)
NEUTROS ABS: 6 10*3/uL (ref 1.7–7.7)
NEUTROS PCT: 83 % — AB (ref 43–77)
Platelets: 178 10*3/uL (ref 150–400)
RBC: 1.76 MIL/uL — ABNORMAL LOW (ref 3.87–5.11)
RDW: 33 % — AB (ref 11.5–15.5)
WBC: 7.3 10*3/uL (ref 4.0–10.5)

## 2014-07-16 LAB — GLUCOSE, CAPILLARY: Glucose-Capillary: 102 mg/dL — ABNORMAL HIGH (ref 70–99)

## 2014-07-16 LAB — LACTATE DEHYDROGENASE: LDH: 649 U/L — AB (ref 94–250)

## 2014-07-16 MED ORDER — PREDNISONE 50 MG PO TABS: 50.0000 mg | ORAL_TABLET | Freq: Two times a day (BID) | ORAL | Status: DC

## 2014-07-16 MED ORDER — PANTOPRAZOLE SODIUM 40 MG PO TBEC: 40.0000 mg | DELAYED_RELEASE_TABLET | Freq: Every day | ORAL | Status: DC

## 2014-07-16 NOTE — Telephone Encounter (Signed)
per pof to sch pt appt-cld & spoke to [name] she took pt info and stated she will get appt sch

## 2014-07-16 NOTE — Discharge Summary (Signed)
Physician Discharge Summary  KANDYCE DIEGUEZ XVQ:[id number] DOB: May 30, 1956 DOA: 07/13/2014  PCP: Horatio Pel, MD  Admit date: 07/13/2014 Discharge date: 07/16/2014  Time spent: 35 minutes  Recommendations for Outpatient Follow-up:  1. Please follow up on CBC in 1 week, she was transfused for profound anemia due to Hemolytic Anemia, having Hg of 6.6 on day of discharge    Discharge Diagnoses:  Principal Problem:   Symptomatic anemia Active Problems:   Human immunodeficiency virus (HIV) disease   History of cardiovascular disorder   Macrocytic anemia   Hemolytic anemia   Discharge Condition: Stable  Diet recommendation: Regular diet  Filed Weights   07/14/14 0253  Weight: 74.6 kg (164 lb 7.4 oz)    History of present illness:  Theresa Morrison is a 58 y.o. female with history of HIV and previous stroke and hyperlipidemia has been experiencing exertional shortness of breath with some chest tightness over the last 1 month. Patient was referred to the cardiologist and has had stress test which was negative as per the patient. Stress test was done last week. Since patient's symptoms was persistent patient had followed up with her infectious disease doctor yesterday and had blood work done which showed severe anemia. Patient was advised to come to the ER. Repeat blood work come from the patient has Implanon for fecal occult blood was negative. Patient's blood work showed macrocytic picture with mildly elevated bilirubin and LDH. Patient at this time on lying down is asymptomatic. Patient says he gets symptomatic on exertion only. Has had one episode of nausea vomiting and diarrhea 2 weeks ago. Patient is afebrile but stated that she was having fever 1 week ago while she was in the cardiologist's office. Chest x-ray done a week ago was unremarkable. Creatinine is mildly elevated from yesterday. Patient denies any blood in the stool or black stools. Patient has had previous  history of hematuria but has not noticed any blood in the urine at this time. Patient states she also had a colonoscopy a few years ago which was unremarkable. Patient will be admitted for further management of her symptomatic anemia. Platelet counts and normal. Other than beta blockers patient has not been started on any new medications.   Hospital Course:  Patient is a pleasant 58 year old female with a history of HIV, admitted to the medicine service on 07/14/2014, presenting with complaints of exertional shortness of breath and chest tightness. Lab work performed that her infectious disease specialist office revealed profound anemia with hemoglobin of 4.2 and was referred to the emergency department. She was typed and cross and transfused 2 units of packed red blood cells. Patient was evaluated by Dr. Jana Hakim of hematology. Workup revealed an elevated LDH of 719 with reticulocyte count of 22% and a Coombs test positive for IgG and complement. Patient diagnosed with warm body hemolytic anemia and started on steroids. She had a gradual upward trend in her hemoglobin, steadily increasing from 6.3 on 07/15/2014 to 6.6 on 07/16/2014 without blood transfusion. She was ambulating around the hallways without developing significant shortness of breath or chest discomfort. Patient was discharged to home in stable condition on 07/16/2014 on oral steroids. Follow-up appointments with hematology scheduled in one week.  Procedures: Blood transfusion with 2 units of packed red blood cells  Consultations:  Hematology  Discharge Exam: Filed Vitals:   07/16/14 0537  BP: 106/58  Pulse: 74  Temp: 98.6 F (37 C)  Resp: 16    General: Patient is awake, alert, no acute  distress, anticipating discharge today. Cardiovascular: Regular rate and rhythm normal S1-S2 no murmurs rubs or gallops Respiratory: Normal respiratory effort, lungs clear to auscultation bilaterally Extremities: No edema  Discharge  Instructions   Discharge Instructions    Call MD for:  difficulty breathing, headache or visual disturbances    Complete by:  As directed      Call MD for:  extreme fatigue    Complete by:  As directed      Call MD for:  hives    Complete by:  As directed      Call MD for:  persistant dizziness or light-headedness    Complete by:  As directed      Call MD for:  persistant nausea and vomiting    Complete by:  As directed      Call MD for:  redness, tenderness, or signs of infection (pain, swelling, redness, odor or green/yellow discharge around incision site)    Complete by:  As directed      Call MD for:  severe uncontrolled pain    Complete by:  As directed      Call MD for:  temperature >100.4    Complete by:  As directed      Diet - low sodium heart healthy    Complete by:  As directed      Increase activity slowly    Complete by:  As directed           Current Discharge Medication List    START taking these medications   Details  pantoprazole (PROTONIX) 40 MG tablet Take 1 tablet (40 mg total) by mouth daily. 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 Qty: 60 tablet, Refills: 1   Associated Diagnoses: Human immunodeficiency virus (HIV) disease; Symptomatic anemia; Macrocytic anemia; Chlamydia trachomatis infection of lower genitourinary sites      CONTINUE these medications which have NOT CHANGED   Details  aspirin 81 MG tablet Take 81 mg by mouth daily.      atorvastatin (LIPITOR) 10 MG tablet Take 10 mg by mouth daily.    beta carotene w/minerals (OCUVITE) tablet Take 2 tablets by mouth daily.    calcium carbonate (OS-CAL) 600 MG TABS Take 600 mg by mouth daily.      !! emtricitabine-tenofovir (TRUVADA)  200-300 MG per tablet Take 1 tablet by mouth daily.    metoprolol succinate (TOPROL-XL) 25 MG 24 hr tablet Take 25 mg by mouth daily. Refills: 1    NITROSTAT 0.4 MG SL tablet Place 0.4 mg under the tongue every 5 (five) minutes as needed for chest pain.  Refills: 0    Omega-3 Fatty Acids (FISH OIL) 1000 MG CAPS Take 1 capsule by mouth daily.     zolpidem (AMBIEN) 10 MG tablet Take 1/2-1 tablet at bedtime as needed for trouble sleeping Qty: 30 tablet, Refills: 2   Associated Diagnoses: Insomnia    clopidogrel (PLAVIX) 75 MG tablet TAKE 1 TABLET DAILY Qty: 90 tablet, Refills: 3    Nevirapine 400 MG TB24 TAKE 1 TABLET DAILY Qty: 90 tablet, Refills: 3    !! TRUVADA 200-300 MG per tablet TAKE 1 TABLET DAILY Qty: 90 tablet, Refills: 3     !! - Potential duplicate medications found. Please discuss with  provider.    STOP taking these medications     nevirapine (VIRAMUNE) 200 MG tablet        Allergies  Allergen Reactions  . Penicillins     REACTION: facial swelling  . Stavudine     REACTION: peripheral neuropathy  . Sulfamethoxazole-Trimethoprim     REACTION: hives   Follow-up Information    Follow up with MAGRINAT,GUSTAV C, MD In 1 week.   Specialty:  Oncology   Contact information:   [place] [address] [phone]        The results of significant diagnostics from this hospitalization (including imaging, microbiology, ancillary and laboratory) are listed below for reference.    Significant Diagnostic Studies: Dg Chest 2 View  07/01/2014   CLINICAL DATA:  Shortness of breath x3 -4 weeks.  EXAM: CHEST  2 VIEW  COMPARISON:  None.  FINDINGS: No acute cardiopulmonary disease. No pleural effusion or pneumothorax. No acute bony abnormality .  IMPRESSION: No acute abnormality.   Electronically Signed   By: Marcello Moores  Register   On: 07/01/2014 14:32    Microbiology: Recent Results (from the past 240 hour(s))  Blood culture (routine single)      Status: None (Preliminary result)   Collection Time: 07/13/14  5:20 PM  Result Value Ref Range Status   Preliminary Report Blood Culture received; No Growth to date;  Preliminary   Preliminary Report Culture will be held for 5 days before issuing  Preliminary   Preliminary Report a Final Negative report.  Preliminary  Blood culture (routine single)     Status: None (Preliminary result)   Collection Time: 07/13/14  5:20 PM  Result Value Ref Range Status   Preliminary Report Blood Culture received; No Growth to date;  Preliminary   Preliminary Report Culture will be held for 5 days before issuing  Preliminary   Preliminary Report a Final Negative report.  Preliminary     Labs: Basic Metabolic Panel:  Recent Labs Lab 07/13/14 1720 07/14/14 07/14/14 0442 07/15/14 0328 07/16/14 0642  NA 137 137 138 141 139  K 3.7 3.2* 3.4* 4.4 4.1  CL 105 108 108 115* 111  CO2 24 21 20 21 20   GLUCOSE 96 113* 116* 131* 98  BUN 17 17 16 13 18   CREATININE 1.10 1.18* 1.25* 1.04 1.04  CALCIUM 8.8 8.8 8.4 8.5 8.8   Liver Function Tests:  Recent Labs Lab 07/13/14 1720 07/14/14 07/14/14 0442  AST 33 37 33  ALT 11 15 12   ALKPHOS 132* 138* 114  BILITOT 2.9* 1.9* 1.6*  PROT 6.0 5.8* 5.0*  ALBUMIN 4.4 4.1 3.5    Recent Labs Lab 07/14/14  LIPASE 26   No results for input(s): AMMONIA in the last 168 hours. CBC:  Recent Labs Lab 07/13/14 1720 07/14/14 07/14/14 0442 07/15/14 0328 07/16/14 0642  WBC 6.5 6.9 4.9 5.4 7.3  NEUTROABS 4.4 4.6 3.3 4.7 PENDING  HGB 4.2* 4.0* 3.2* 6.3* 6.6*  HCT 12.8* 12.7* 10.1* 18.7* 20.0*  MCV 125.5* 129.6* 131.2* 109.4* 113.6*  PLT 212 207 176 169 178   Cardiac Enzymes:  Recent Labs Lab 07/14/14 0442  TROPONINI <0.03   BNP: BNP (last 3 results) No results for input(s): PROBNP in the last 8760 hours. CBG:  Recent Labs Lab 07/15/14 0830 07/15/14 1223 07/15/14 1717 07/15/14 2116 07/16/14 0739  GLUCAP 95 95 123* 112* 102*        Signed:  [name] Urton  [facility] 07/16/2014, 10:07 AM

## 2014-07-16 NOTE — Progress Notes (Signed)
Discharge instructions and prescriptions given and explained to pt.  Pt verbalized understanding of all orders/instructions and denies any questions.  Home meds picked up from pharmacy and given back to pt.  Pt declines counting all medications.  VSS. IV's removed.  Pt discharged to home with husband in stable condition with all belongings. Theresa Morrison

## 2014-07-17 LAB — HEMOGLOBIN A1C
Hgb A1c MFr Bld: 4.2 % (ref <5.7)
Mean Plasma Glucose: 74 mg/dL (ref <117)

## 2014-07-19 LAB — TYPE AND SCREEN
ABO/RH(D): O POS
ANTIBODY SCREEN: POSITIVE
DAT, IgG: POSITIVE
DONOR AG TYPE: NEGATIVE
Donor AG Type: NEGATIVE
UNIT DIVISION: 0
UNIT DIVISION: 0
UNIT DIVISION: 0
Unit division: 0
Unit division: 0
Unit division: 0

## 2014-07-19 LAB — CULTURE, BLOOD (SINGLE)
Organism ID, Bacteria: NO GROWTH
Organism ID, Bacteria: NO GROWTH

## 2014-07-22 ENCOUNTER — Encounter (HOSPITAL_COMMUNITY): Payer: Federal, State, Local not specified - PPO | Attending: Hematology & Oncology

## 2014-07-22 DIAGNOSIS — D598 Other acquired hemolytic anemias: Secondary | ICD-10-CM | POA: Insufficient documentation

## 2014-07-22 LAB — CBC WITH DIFFERENTIAL/PLATELET
Basophils Absolute: 0 10*3/uL (ref 0.0–0.1)
Basophils Relative: 0 % (ref 0–1)
EOS PCT: 0 % (ref 0–5)
Eosinophils Absolute: 0 10*3/uL (ref 0.0–0.7)
HCT: 27.6 % — ABNORMAL LOW (ref 36.0–46.0)
HEMOGLOBIN: 9.2 g/dL — AB (ref 12.0–15.0)
LYMPHS ABS: 0.5 10*3/uL — AB (ref 0.7–4.0)
LYMPHS PCT: 6 % — AB (ref 12–46)
MCH: 40.7 pg — ABNORMAL HIGH (ref 26.0–34.0)
MCHC: 33.3 g/dL (ref 30.0–36.0)
MCV: 121.8 fL — ABNORMAL HIGH (ref 78.0–100.0)
Monocytes Absolute: 0.6 10*3/uL (ref 0.1–1.0)
Monocytes Relative: 7 % (ref 3–12)
Neutro Abs: 7.2 10*3/uL (ref 1.7–7.7)
Neutrophils Relative %: 87 % — ABNORMAL HIGH (ref 43–77)
Platelets: 170 10*3/uL (ref 150–400)
RBC: 2.29 MIL/uL — AB (ref 3.87–5.11)
WBC: 8.3 10*3/uL (ref 4.0–10.5)

## 2014-07-22 LAB — RETICULOCYTES
RBC.: 2.29 MIL/uL — AB (ref 3.87–5.11)
RETIC COUNT ABSOLUTE: 494.6 10*3/uL — AB (ref 19.0–186.0)
Retic Ct Pct: 21.6 % — ABNORMAL HIGH (ref 0.4–3.1)

## 2014-07-22 LAB — COMPREHENSIVE METABOLIC PANEL
ALT: 26 U/L (ref 0–35)
AST: 18 U/L (ref 0–37)
Albumin: 4.2 g/dL (ref 3.5–5.2)
Alkaline Phosphatase: 118 U/L — ABNORMAL HIGH (ref 39–117)
Anion gap: 6 (ref 5–15)
BILIRUBIN TOTAL: 1.5 mg/dL — AB (ref 0.3–1.2)
BUN: 21 mg/dL (ref 6–23)
CHLORIDE: 108 meq/L (ref 96–112)
CO2: 28 mmol/L (ref 19–32)
Calcium: 9.1 mg/dL (ref 8.4–10.5)
Creatinine, Ser: 0.81 mg/dL (ref 0.50–1.10)
GFR calc Af Amer: >90 mL/min (ref >90)
GFR calc non Af Amer: 79 mL/min — ABNORMAL LOW (ref >90)
Glucose, Bld: 97 mg/dL (ref 70–99)
POTASSIUM: 3.9 mmol/L (ref 3.5–5.1)
SODIUM: 142 mmol/L (ref 135–145)
Total Protein: 5.9 g/dL — ABNORMAL LOW (ref 6.0–8.3)

## 2014-07-22 LAB — LACTATE DEHYDROGENASE: LDH: 526 U/L — AB (ref 94–250)

## 2014-07-22 NOTE — Progress Notes (Signed)
Lab draw

## 2014-07-23 ENCOUNTER — Other Ambulatory Visit: Payer: Self-pay | Admitting: Internal Medicine

## 2014-07-26 ENCOUNTER — Other Ambulatory Visit: Payer: Self-pay | Admitting: Oncology

## 2014-07-31 ENCOUNTER — Encounter (HOSPITAL_COMMUNITY): Payer: Federal, State, Local not specified - PPO | Attending: Hematology & Oncology | Admitting: Hematology & Oncology

## 2014-07-31 ENCOUNTER — Encounter (HOSPITAL_COMMUNITY): Payer: Self-pay | Admitting: Hematology & Oncology

## 2014-07-31 ENCOUNTER — Other Ambulatory Visit: Payer: Self-pay | Admitting: Internal Medicine

## 2014-07-31 VITALS — BP 115/64 | HR 73 | Temp 98.4°F | Resp 17 | Ht 64.75 in | Wt 163.6 lb

## 2014-07-31 DIAGNOSIS — D598 Other acquired hemolytic anemias: Secondary | ICD-10-CM | POA: Diagnosis not present

## 2014-07-31 DIAGNOSIS — B20 Human immunodeficiency virus [HIV] disease: Secondary | ICD-10-CM

## 2014-07-31 DIAGNOSIS — Z23 Encounter for immunization: Secondary | ICD-10-CM

## 2014-07-31 DIAGNOSIS — D589 Hereditary hemolytic anemia, unspecified: Secondary | ICD-10-CM

## 2014-07-31 DIAGNOSIS — R7989 Other specified abnormal findings of blood chemistry: Secondary | ICD-10-CM

## 2014-07-31 DIAGNOSIS — D591 Other autoimmune hemolytic anemias: Secondary | ICD-10-CM

## 2014-07-31 LAB — RETICULOCYTES
RBC.: 2.68 MIL/uL — ABNORMAL LOW (ref 3.87–5.11)
RETIC CT PCT: 4.8 % — AB (ref 0.4–3.1)
Retic Count, Absolute: 128.6 10*3/uL (ref 19.0–186.0)

## 2014-07-31 LAB — CBC WITH DIFFERENTIAL/PLATELET
BASOS PCT: 0 % (ref 0–1)
Basophils Absolute: 0 10*3/uL (ref 0.0–0.1)
EOS ABS: 0 10*3/uL (ref 0.0–0.7)
Eosinophils Relative: 0 % (ref 0–5)
HCT: 31.8 % — ABNORMAL LOW (ref 36.0–46.0)
Hemoglobin: 11.2 g/dL — ABNORMAL LOW (ref 12.0–15.0)
Lymphocytes Relative: 2 % — ABNORMAL LOW (ref 12–46)
Lymphs Abs: 0.2 10*3/uL — ABNORMAL LOW (ref 0.7–4.0)
MCH: 41.8 pg — ABNORMAL HIGH (ref 26.0–34.0)
MCHC: 35.2 g/dL (ref 30.0–36.0)
MCV: 118.7 fL — ABNORMAL HIGH (ref 78.0–100.0)
MONO ABS: 0.5 10*3/uL (ref 0.1–1.0)
Monocytes Relative: 6 % (ref 3–12)
Neutro Abs: 7 10*3/uL (ref 1.7–7.7)
Neutrophils Relative %: 92 % — ABNORMAL HIGH (ref 43–77)
PLATELETS: 123 10*3/uL — AB (ref 150–400)
RBC: 2.68 MIL/uL — ABNORMAL LOW (ref 3.87–5.11)
WBC: 7.7 10*3/uL (ref 4.0–10.5)

## 2014-07-31 LAB — COMPREHENSIVE METABOLIC PANEL
ALT: 55 U/L — ABNORMAL HIGH (ref 0–35)
ANION GAP: 4 — AB (ref 5–15)
AST: 20 U/L (ref 0–37)
Albumin: 4.1 g/dL (ref 3.5–5.2)
Alkaline Phosphatase: 106 U/L (ref 39–117)
BILIRUBIN TOTAL: 0.8 mg/dL (ref 0.3–1.2)
BUN: 21 mg/dL (ref 6–23)
CO2: 27 mmol/L (ref 19–32)
Calcium: 8.9 mg/dL (ref 8.4–10.5)
Chloride: 110 mEq/L (ref 96–112)
Creatinine, Ser: 0.77 mg/dL (ref 0.50–1.10)
GFR calc Af Amer: >90 mL/min (ref >90)
GFR calc non Af Amer: >90 mL/min (ref >90)
Glucose, Bld: 99 mg/dL (ref 70–99)
Potassium: 3.7 mmol/L (ref 3.5–5.1)
SODIUM: 141 mmol/L (ref 135–145)
TOTAL PROTEIN: 5.7 g/dL — AB (ref 6.0–8.3)

## 2014-07-31 LAB — LACTATE DEHYDROGENASE: LDH: 353 U/L — AB (ref 94–250)

## 2014-07-31 MED ORDER — PREDNISONE 20 MG PO TABS: ORAL_TABLET | ORAL | Status: DC

## 2014-07-31 MED ORDER — DAPSONE 100 MG PO TABS: 100.0000 mg | ORAL_TABLET | Freq: Every day | ORAL | Status: DC

## 2014-07-31 MED ORDER — INFLUENZA VAC SPLIT QUAD 0.5 ML IM SUSY
PREFILLED_SYRINGE | INTRAMUSCULAR | Status: AC
  Filled 2014-07-31: qty 0.5

## 2014-07-31 MED ORDER — INFLUENZA VAC SPLIT QUAD 0.5 ML IM SUSY
0.5000 mL | PREFILLED_SYRINGE | Freq: Once | INTRAMUSCULAR | Status: AC
Start: 2014-07-31 — End: 2014-07-31
  Administered 2014-07-31: 0.5 mL via INTRAMUSCULAR

## 2014-07-31 NOTE — Patient Instructions (Signed)
[facility] [place] Discharge Instructions  RECOMMENDATIONS MADE BY THE CONSULTANT AND ANY TEST RESULTS WILL BE SENT TO YOUR REFERRING PHYSICIAN.  Hemolytic anemia Take two 20mg  prednisone tablets in the morning and 2 at night. Put your 50 mg tablets up for now. We will check blood counts next week and if stable we will decrease your prednisone more. I am going to start you on dapsone once daily to prevent PCP in your lungs from the steroids. Call with any problems or concerns.  Thank you for choosing [facility] [place] at [facility] to  provide your oncology and hematology care. To afford each patient quality  time with our provider, please arrive at least 15 minutes before your  scheduled appointment time.  You need to re-schedule your appointment should you arrive 10 or more  minutes late. We strive to give you quality time with our providers, and  arriving late affects you and other patients whose appointments are after  yours. Also, if you no show three or more times for appointments you may  be dismissed from the clinic at the providers discretion.  Again, thank you for choosing [facility]. Our hope is that  these requests will decrease the amount of time that you wait before being seen by our physicians. _______________________________________________________________  Should you have questions after your visit to [facility], please contact our office at [phone] between the hours of 8:30 a.m. and 5:00 p.m.  Voicemails left after 4:30 p.m. will not be returned until the following business day.  For prescription refill requests, have your pharmacy contact our office with your prescription refill request.

## 2014-07-31 NOTE — Progress Notes (Signed)
[place] CONSULT NOTE  Patient Care Team: Horatio Pel, MD as PCP - General (Internal Medicine) Michel Bickers, MD as PCP - Infectious Diseases (Infectious Diseases) Molli Hazard, MD as Consulting Physician (Hematology and Oncology)  CHIEF COMPLAINTS/PURPOSE OF CONSULTATION:  Hemolytic anemia, coombs positive,  Admitted to [facility] on 07/13/2014 with Hb 3.2, reticulocyte percent at 23%, LDH 719, T bili 2.9, Haptoglobin < 25 and Coombs positive for IgG and complement  Prednisone  HIV positivity: on nevirapine and truvada with excellent long-term control--followed by Dr Megan Salon  HISTORY OF PRESENTING ILLNESS:  Theresa Morrison 59 y.o. female is here because of hemolytic anemia. She presented to [facility] ED with SOB. She had seen Dr. Megan Salon that day and was advised to go to the ED because of abnormal laboratory studies.  She reports feeling SOB and arm heaviness since Thanksgiving. She also noticed gradual worsening of fatigue. She was referred to a Cardiologist because of concern her SOB may be cardiac related.  She did undergo a stress test which was normal.   She is currently on prednisone and is doing ok. Her energy is somewhat better. She notices feeling jittery and has some difficulty sleeping since starting prednisone.    MEDICAL HISTORY:  Past Medical History  Diagnosis Date  . HIV positive 1987  . Colon polyps     adenomatous  . CVA (cerebral vascular accident)   . Shingles   . Toxoplasmosis   . Hypercholesterolemia     SURGICAL HISTORY: Past Surgical History  Procedure Laterality Date  . Tonsillectomy    . Appendectomy  2001  . Knee arthroscopy Bilateral   . Shoulder surgery Right     SOCIAL HISTORY: History   Social History  . Marital Status: Married    Spouse Name: N/A    Number of Children: 2  . Years of Education: N/A   Occupational History  .     Social History Main Topics  . Smoking status: Never  Smoker   . Smokeless tobacco: Never Used  . Alcohol Use: No  . Drug Use: No  . Sexual Activity: Not Currently     Comment: declined condoms   Other Topics Concern  . Not on file   Social History Narrative    FAMILY HISTORY: Family History  Problem Relation Age of Onset  . Heart attack Father   . Breast cancer Mother     mets to lung and brain   has no family status information on file.   Mother died at 55 from breast cancer Father died at 28 from MI 1 sister, 3 brothers all healthy  ALLERGIES:  is allergic to penicillins; stavudine; and sulfamethoxazole-trimethoprim.  MEDICATIONS:  Current Outpatient Prescriptions  Medication Sig Dispense Refill  . aspirin 81 MG tablet Take 81 mg by mouth daily.      Marland Kitchen atorvastatin (LIPITOR) 10 MG tablet Take 10 mg by mouth daily.    . beta carotene w/minerals (OCUVITE) tablet Take 2 tablets by mouth daily.    . calcium carbonate (OS-CAL) 600 MG TABS Take 600 mg by mouth daily.      . Nevirapine 400 MG TB24 TAKE 1 TABLET DAILY 90 tablet 3  . NITROSTAT 0.4 MG SL tablet Place 0.4 mg under the tongue every 5 (five) minutes as needed for chest pain.   0  . Omega-3 Fatty Acids (FISH OIL) 1000 MG CAPS Take 1 capsule by mouth daily.     . pantoprazole (PROTONIX) 40  MG tablet Take 1 tablet (40 mg total) by mouth daily. 30 tablet 1  . TRUVADA 200-300 MG per tablet TAKE 1 TABLET DAILY 90 tablet 3  . zolpidem (AMBIEN) 10 MG tablet Take 1/2-1 tablet at bedtime as needed for trouble sleeping 30 tablet 2  . clopidogrel (PLAVIX) 75 MG tablet Take 1 tablet (75 mg total) by mouth daily. 90 tablet 0  . dapsone 100 MG tablet Take 1 tablet (100 mg total) by mouth daily. 30 tablet 1  . predniSONE (DELTASONE) 20 MG tablet Take as instructed during your office visit Start with 2 tablets twice daily  Put your 50 mg tablets away for now. (Patient taking differently: Take 60 mg by mouth daily with breakfast. Take as instructed during your office visit Start with 2  tablets twice daily  Put your 50 mg tablets away for now.) 120 tablet 1   No current facility-administered medications for this visit.    Review of Systems  Constitutional: Negative.   HENT: Negative.   Eyes: Positive for blurred vision. Negative for double vision, photophobia, pain, discharge and redness.  Respiratory: Negative.   Cardiovascular: Negative.   Gastrointestinal: Negative.   Genitourinary: Negative.   Musculoskeletal:       Ankle/knee pain at night since steroids.  Skin: Negative.   Neurological: Negative.   Endo/Heme/Allergies: Negative.   Psychiatric/Behavioral: Negative.     PHYSICAL EXAMINATION:  ECOG PERFORMANCE STATUS: 1 - Symptomatic but completely ambulatory  Filed Vitals:   07/31/14 1537  BP: 115/64  Pulse: 73  Temp: 98.4 F (36.9 C)  Resp: 17   Filed Weights   07/31/14 1537  Weight: 163 lb 9.6 oz (74.208 kg)     Physical Exam  Constitutional: She is oriented to person, place, and time and well-developed, well-nourished, and in no distress. No distress.  lipodystrophy  HENT:  Head: Normocephalic and atraumatic.  Nose: Nose normal.  Mouth/Throat: Oropharynx is clear and moist. No oropharyngeal exudate.  Eyes: Conjunctivae and EOM are normal. Pupils are equal, round, and reactive to light. Right eye exhibits no discharge. Left eye exhibits no discharge. No scleral icterus.  Neck: Normal range of motion. Neck supple. No tracheal deviation present. No thyromegaly present.  Cardiovascular: Normal rate, regular rhythm and normal heart sounds.  Exam reveals no gallop and no friction rub.   No murmur heard. Pulmonary/Chest: Effort normal and breath sounds normal. She has no wheezes. She has no rales.  Abdominal: Soft. Bowel sounds are normal. She exhibits no distension and no mass. There is no tenderness. There is no rebound and no guarding.  Musculoskeletal: Normal range of motion. She exhibits no edema.  Lymphadenopathy:    She has no cervical  adenopathy.  Neurological: She is alert and oriented to person, place, and time. She has normal reflexes. No cranial nerve deficit. Gait normal. Coordination normal.  Skin: Skin is warm and dry. No rash noted. She is not diaphoretic.  Psychiatric: Mood, memory, affect and judgment normal.  Nursing note and vitals reviewed.    LABORATORY DATA:  I have reviewed the data as listed Lab Results  Component Value Date   WBC 5.1 08/17/2014   HGB 11.5* 08/17/2014   HCT 33.2* 08/17/2014   MCV 115.7* 08/17/2014   PLT 102* 08/17/2014     Chemistry      Component Value Date/Time   NA 141 08/17/2014 1600   K 3.7 08/17/2014 1600   CL 110 08/17/2014 1600   CO2 26 08/17/2014 1600   BUN  17 08/17/2014 1600   CREATININE 0.88 08/17/2014 1600   CREATININE 1.10 07/13/2014 1720      Component Value Date/Time   CALCIUM 8.7 08/17/2014 1600   ALKPHOS 124* 08/17/2014 1600   AST 27 08/17/2014 1600   ALT 61* 08/17/2014 1600   BILITOT 0.9 08/17/2014 1600      ASSESSMENT & PLAN:  Hemolytic anemia Pleasant 59 year old female with a long-standing history of HIV with excellent control. She was admitted to [facility] in December with a Coombs-positive hemolytic anemia. Her counts are responding nicely to immunosuppression with prednisone. I discussed with her the based upon her blood counts today we can begin to taper her prednisone. We will continue with weekly CBCs, gradually tapering her prednisone as her counts permit. She is to continue her multivitamin and also her Protonix.  We spent some time today discussing hemolytic anemia, and I provided her with reading information. I have advised her if she has difficulties or problems prior to any follow-up with [place] she is to notify us and we can arrange for a visit sooner. Hopefully she will achieve a durable remission from prednisone, but we did discuss other treatment options today. These options were also addressed with her during her hospital stay. We will  see her back again in 2 weeks, CBCs will be performed weekly as detailed.    Orders Placed This Encounter  Procedures  . CBC with Differential    Standing Status: Future     Number of Occurrences: 1     Standing Expiration Date: 07/31/2015  . Lactate dehydrogenase    Standing Status: Future     Number of Occurrences: 1     Standing Expiration Date: 07/31/2015  . Reticulocytes    Standing Status: Future     Number of Occurrences: 1     Standing Expiration Date: 07/31/2015  . Comprehensive metabolic panel    Standing Status: Future     Number of Occurrences: 1     Standing Expiration Date: 07/31/2015  . Haptoglobin    Standing Status: Future     Number of Occurrences: 1     Standing Expiration Date: 07/31/2015  . CBC with Differential    Standing Status: Future     Number of Occurrences: 1     Standing Expiration Date: 08/01/2015  . CBC with Differential    Standing Status: Future     Number of Occurrences: 1     Standing Expiration Date: 08/01/2015  . Comprehensive metabolic panel    Standing Status: Future     Number of Occurrences: 1     Standing Expiration Date: 08/01/2015  . Haptoglobin    Standing Status: Future     Number of Occurrences: 1     Standing Expiration Date: 08/01/2015  . Reticulocytes    Standing Status: Future     Number of Occurrences: 1     Standing Expiration Date: 08/01/2015    All questions were answered. The patient knows to call the clinic with any problems, questions or concerns.   Molli Hazard, MD MD 08/18/2014 6:57 PM

## 2014-07-31 NOTE — Progress Notes (Signed)
Theresa Morrison presents today for injection per MD orders. Flu vaccine administered IM in left deltoid. Administration without incident. Patient tolerated well.

## 2014-07-31 NOTE — Progress Notes (Signed)
Theresa Morrison's reason for visit today is for labs as scheduled per MD orders.  Venipuncture performed with a 23 gauge butterfly needle to [place] tolerated procedure well and without incident; questions were answered and patient was discharged.

## 2014-08-01 LAB — HAPTOGLOBIN: Haptoglobin: 19 mg/dL — ABNORMAL LOW (ref 43–212)

## 2014-08-03 ENCOUNTER — Ambulatory Visit (HOSPITAL_COMMUNITY): Payer: Medicare Other | Admitting: Hematology & Oncology

## 2014-08-03 LAB — T-HELPER CELLS (CD4) COUNT (NOT AT ARMC)
CD4 % Helper T Cell: 23 % — ABNORMAL LOW (ref 33–55)
CD4 T CELL ABS: 60 /uL — AB (ref 400–2700)

## 2014-08-07 ENCOUNTER — Encounter (HOSPITAL_BASED_OUTPATIENT_CLINIC_OR_DEPARTMENT_OTHER): Payer: Federal, State, Local not specified - PPO

## 2014-08-07 DIAGNOSIS — D598 Other acquired hemolytic anemias: Secondary | ICD-10-CM | POA: Diagnosis not present

## 2014-08-07 DIAGNOSIS — D589 Hereditary hemolytic anemia, unspecified: Secondary | ICD-10-CM

## 2014-08-07 LAB — CBC WITH DIFFERENTIAL/PLATELET
Basophils Absolute: 0 10*3/uL (ref 0.0–0.1)
Basophils Relative: 0 % (ref 0–1)
EOS PCT: 0 % (ref 0–5)
Eosinophils Absolute: 0 10*3/uL (ref 0.0–0.7)
HCT: 32.6 % — ABNORMAL LOW (ref 36.0–46.0)
HEMOGLOBIN: 11.6 g/dL — AB (ref 12.0–15.0)
Lymphocytes Relative: 14 % (ref 12–46)
Lymphs Abs: 0.7 10*3/uL (ref 0.7–4.0)
MCH: 41.3 pg — ABNORMAL HIGH (ref 26.0–34.0)
MCHC: 35.6 g/dL (ref 30.0–36.0)
MCV: 116 fL — ABNORMAL HIGH (ref 78.0–100.0)
MONOS PCT: 8 % (ref 3–12)
Monocytes Absolute: 0.4 10*3/uL (ref 0.1–1.0)
Neutro Abs: 3.6 10*3/uL (ref 1.7–7.7)
Neutrophils Relative %: 77 % (ref 43–77)
Platelets: 63 10*3/uL — ABNORMAL LOW (ref 150–400)
RBC: 2.81 MIL/uL — ABNORMAL LOW (ref 3.87–5.11)
RDW: 15 % (ref 11.5–15.5)
WBC: 4.6 10*3/uL (ref 4.0–10.5)

## 2014-08-07 NOTE — Progress Notes (Signed)
Labs for cbcd 

## 2014-08-12 ENCOUNTER — Other Ambulatory Visit: Payer: Self-pay | Admitting: *Deleted

## 2014-08-12 MED ORDER — CLOPIDOGREL BISULFATE 75 MG PO TABS: 75.0000 mg | ORAL_TABLET | Freq: Every day | ORAL | Status: DC

## 2014-08-17 ENCOUNTER — Encounter (HOSPITAL_COMMUNITY): Payer: Self-pay | Admitting: Hematology & Oncology

## 2014-08-17 ENCOUNTER — Encounter (HOSPITAL_BASED_OUTPATIENT_CLINIC_OR_DEPARTMENT_OTHER): Payer: Federal, State, Local not specified - PPO | Admitting: Hematology & Oncology

## 2014-08-17 ENCOUNTER — Encounter (HOSPITAL_COMMUNITY): Payer: Federal, State, Local not specified - PPO

## 2014-08-17 VITALS — BP 127/73 | HR 66 | Temp 97.9°F | Resp 18 | Wt 165.4 lb

## 2014-08-17 DIAGNOSIS — D589 Hereditary hemolytic anemia, unspecified: Secondary | ICD-10-CM

## 2014-08-17 DIAGNOSIS — D598 Other acquired hemolytic anemias: Secondary | ICD-10-CM | POA: Diagnosis not present

## 2014-08-17 DIAGNOSIS — D599 Acquired hemolytic anemia, unspecified: Secondary | ICD-10-CM

## 2014-08-17 DIAGNOSIS — D696 Thrombocytopenia, unspecified: Secondary | ICD-10-CM

## 2014-08-17 LAB — RETICULOCYTES
RBC.: 2.87 MIL/uL — ABNORMAL LOW (ref 3.87–5.11)
RETIC COUNT ABSOLUTE: 149.2 10*3/uL (ref 19.0–186.0)
Retic Ct Pct: 5.2 % — ABNORMAL HIGH (ref 0.4–3.1)

## 2014-08-17 LAB — COMPREHENSIVE METABOLIC PANEL
ALK PHOS: 124 U/L — AB (ref 39–117)
ALT: 61 U/L — ABNORMAL HIGH (ref 0–35)
ANION GAP: 5 (ref 5–15)
AST: 27 U/L (ref 0–37)
Albumin: 4.2 g/dL (ref 3.5–5.2)
BILIRUBIN TOTAL: 0.9 mg/dL (ref 0.3–1.2)
BUN: 17 mg/dL (ref 6–23)
CO2: 26 mmol/L (ref 19–32)
Calcium: 8.7 mg/dL (ref 8.4–10.5)
Chloride: 110 mmol/L (ref 96–112)
Creatinine, Ser: 0.88 mg/dL (ref 0.50–1.10)
GFR, EST AFRICAN AMERICAN: 83 mL/min — AB (ref >90)
GFR, EST NON AFRICAN AMERICAN: 72 mL/min — AB (ref >90)
Glucose, Bld: 94 mg/dL (ref 70–99)
Potassium: 3.7 mmol/L (ref 3.5–5.1)
SODIUM: 141 mmol/L (ref 135–145)
Total Protein: 6 g/dL (ref 6.0–8.3)

## 2014-08-17 LAB — CBC WITH DIFFERENTIAL/PLATELET
Basophils Absolute: 0 10*3/uL (ref 0.0–0.1)
Basophils Relative: 0 % (ref 0–1)
Eosinophils Absolute: 0 10*3/uL (ref 0.0–0.7)
Eosinophils Relative: 0 % (ref 0–5)
HCT: 33.2 % — ABNORMAL LOW (ref 36.0–46.0)
Hemoglobin: 11.5 g/dL — ABNORMAL LOW (ref 12.0–15.0)
LYMPHS PCT: 4 % — AB (ref 12–46)
Lymphs Abs: 0.2 10*3/uL — ABNORMAL LOW (ref 0.7–4.0)
MCH: 40.1 pg — ABNORMAL HIGH (ref 26.0–34.0)
MCHC: 34.6 g/dL (ref 30.0–36.0)
MCV: 115.7 fL — ABNORMAL HIGH (ref 78.0–100.0)
MONOS PCT: 4 % (ref 3–12)
Monocytes Absolute: 0.2 10*3/uL (ref 0.1–1.0)
Neutro Abs: 4.7 10*3/uL (ref 1.7–7.7)
Neutrophils Relative %: 93 % — ABNORMAL HIGH (ref 43–77)
Platelets: 102 10*3/uL — ABNORMAL LOW (ref 150–400)
RBC: 2.87 MIL/uL — ABNORMAL LOW (ref 3.87–5.11)
RDW: 12.6 % (ref 11.5–15.5)
WBC: 5.1 10*3/uL (ref 4.0–10.5)

## 2014-08-17 NOTE — Progress Notes (Signed)
Theresa Morrison Hank's reason for visit today is for labs as scheduled per MD orders.  Venipuncture performed with a 23 gauge butterfly needle to [place] tolerated procedure well and without incident; questions were answered and patient was discharged.

## 2014-08-17 NOTE — Progress Notes (Signed)
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 female is here because of hemolytic anemia.  She is doing ok. She has an appointment with Dr. Megan Salon tomorrow, because of a significant change in her CD4 count. At her last CBC check, her Hgb was significantly improved but platelets had fallen to 63K. She has no bleeding or bruising complaints. We continue to taper her prednisone.  She states she has taken all her medications correctly, but notes that during her hospitalization she feels her HIV medications were not given appropriately.  She reports feeling weaker than normal and "funny in her head"  She denies fever, chills, nausea, vomiting or change in her bowels.  She is sleeping ok.    MEDICAL HISTORY:  Past Medical History  Diagnosis Date  . HIV positive 1987  . Colon polyps     adenomatous  . CVA (cerebral vascular accident)   . Shingles   . Toxoplasmosis   . Hypercholesterolemia     SURGICAL HISTORY: Past Surgical History  Procedure Laterality Date  . Tonsillectomy    . Appendectomy  2001  . Knee arthroscopy Bilateral   . Shoulder surgery Right     SOCIAL HISTORY: History   Social History  . Marital  Status: Married    Spouse Name: N/A    Number of Children: 2  . Years of Education: N/A   Occupational History  .     Social History Main Topics  . Smoking status: Never Smoker   . Smokeless tobacco: Never Used  . Alcohol Use: No  . Drug Use: No  . Sexual Activity: Not Currently     Comment: declined condoms   Other Topics Concern  . Not on file   Social History Narrative    FAMILY HISTORY: Family History  Problem Relation Age of Onset  . Heart attack Father   . Breast cancer Mother     mets to lung and brain   has no family status information on file.   Mother died at 63 from breast cancer Father died at 58 from MI 1 sister, 3 brothers all healthy  ALLERGIES:  is allergic to penicillins; stavudine; and sulfamethoxazole-trimethoprim.  MEDICATIONS:  Current Outpatient Prescriptions  Medication Sig Dispense Refill  . aspirin 81 MG tablet Take 81 mg by mouth daily.      Marland Kitchen atorvastatin (LIPITOR) 10 MG tablet Take 10 mg by mouth daily.    . beta carotene w/minerals (OCUVITE) tablet Take 2 tablets by mouth daily.    . calcium carbonate (OS-CAL) 600 MG TABS Take 600 mg by mouth daily.      . clopidogrel (PLAVIX) 75 MG tablet Take 1 tablet (75 mg total) by mouth daily. 90 tablet 0  . dapsone 100 MG tablet Take 1 tablet (100  mg total) by mouth daily. 30 tablet 1  . metoprolol succinate (TOPROL-XL) 25 MG 24 hr tablet Take 25 mg by mouth daily.  1  . Nevirapine 400 MG TB24 TAKE 1 TABLET DAILY 90 tablet 3  . Omega-3 Fatty Acids (FISH OIL) 1000 MG CAPS Take 1 capsule by mouth daily.     . pantoprazole (PROTONIX) 40 MG tablet Take 1 tablet (40 mg total) by mouth daily. 30 tablet 1  . predniSONE (DELTASONE) 20 MG tablet Take as instructed during your office visit Start with 2 tablets twice daily  Put your 50 mg tablets away for now. (Patient taking differently: Take 60 mg by mouth daily with breakfast. Take as instructed during your office visit Start with 2 tablets twice daily  Put  your 50 mg tablets away for now.) 120 tablet 1  . TRUVADA 200-300 MG per tablet TAKE 1 TABLET DAILY 90 tablet 3  . zolpidem (AMBIEN) 10 MG tablet Take 1/2-1 tablet at bedtime as needed for trouble sleeping 30 tablet 2  . NITROSTAT 0.4 MG SL tablet Place 0.4 mg under the tongue every 5 (five) minutes as needed for chest pain.   0   No current facility-administered medications for this visit.    Review of Systems  Constitutional: Positive for malaise/fatigue.  Eyes: Negative.   Respiratory: Negative.   Cardiovascular: Negative.   Gastrointestinal: Negative.   Genitourinary: Negative.   Musculoskeletal: Negative.   Skin: Negative.   Neurological: Positive for weakness and headaches. Negative for sensory change, speech change, seizures and loss of consciousness.       Feels "drugged"  Endo/Heme/Allergies: Negative.   Psychiatric/Behavioral: Negative.     PHYSICAL EXAMINATION:  ECOG PERFORMANCE STATUS: 1 - Symptomatic but completely ambulatory  Filed Vitals:   08/17/14 1502  BP: 127/73  Pulse: 66  Temp: 97.9 F (36.6 C)  Resp: 18   Filed Weights   08/17/14 1502  Weight: 165 lb 6.4 oz (75.025 kg)     Physical Exam  Constitutional: She is oriented to person, place, and time and well-developed, well-nourished, and in no distress.  lipodystrophy  HENT:  Head: Normocephalic and atraumatic.  Nose: Nose normal.  Mouth/Throat: Oropharynx is clear and moist. No oropharyngeal exudate.  Eyes: Conjunctivae and EOM are normal. Pupils are equal, round, and reactive to light. Right eye exhibits no discharge. Left eye exhibits no discharge. No scleral icterus.  Neck: Normal range of motion. Neck supple. No tracheal deviation present. No thyromegaly present.  Cardiovascular: Normal rate, regular rhythm and normal heart sounds.  Exam reveals no gallop and no friction rub.   No murmur heard. Pulmonary/Chest: Effort normal and breath sounds normal. She has no wheezes. She has no rales.   Abdominal: Soft. Bowel sounds are normal. She exhibits no distension and no mass. There is no tenderness. There is no rebound and no guarding.  Musculoskeletal: Normal range of motion. She exhibits no edema.  Lymphadenopathy:    She has no cervical adenopathy.  Neurological: She is alert and oriented to person, place, and time. She has normal reflexes. No cranial nerve deficit. Gait normal. Coordination normal.  Skin: Skin is warm and dry. No rash noted.  Psychiatric: Mood, memory, affect and judgment normal.  Nursing note and vitals reviewed.    LABORATORY DATA:  I have reviewed the data as listed Lab Results  Component Value Date   WBC 5.1 08/17/2014   HGB 11.5* 08/17/2014   HCT 33.2* 08/17/2014   MCV 115.7* 08/17/2014  PLT 102* 08/17/2014     Chemistry      Component Value Date/Time   NA 141 08/17/2014 1600   K 3.7 08/17/2014 1600   CL 110 08/17/2014 1600   CO2 26 08/17/2014 1600   BUN 17 08/17/2014 1600   CREATININE 0.88 08/17/2014 1600   CREATININE 1.10 07/13/2014 1720      Component Value Date/Time   CALCIUM 8.7 08/17/2014 1600   ALKPHOS 124* 08/17/2014 1600   AST 27 08/17/2014 1600   ALT 61* 08/17/2014 1600   BILITOT 0.9 08/17/2014 1600       ASSESSMENT & PLAN:  Hemolytic anemia Pleasant 59 year old female with a long-standing history of HIV, who presented in December with a Coombs-positive hemolytic anemia. Her counts are improving on prednisone and we have been tapering her prednisone with maintenance of her counts. At her last visit she had evidence of thrombocytopenia. I suspect at this point it may be autoimmune in nature versus related to her HIV. Given her recent CD4 count I am not sure if her current hematologic problems are caused from her HIV. I will follow-up with Dr. Hale Bogus evaluation and thoughts when she sees him tomorrow.  Have advised Theresa Morrison to continue with her PCP prophylaxis, unless Dr. Megan Salon recommends changing that. We will call  her with the results of her labs today if her counts continue to improve or remain stable I will taper her prednisone further. CBC will be obtained next week. I will see her back formally in 2 weeks with repeat laboratory studies.    Orders Placed This Encounter  Procedures  . CBC with Differential    Standing Status: Future     Number of Occurrences:      Standing Expiration Date: 08/18/2015  . Reticulocytes    Standing Status: Future     Number of Occurrences:      Standing Expiration Date: 08/18/2015  . Lactate dehydrogenase    Standing Status: Future     Number of Occurrences:      Standing Expiration Date: 08/18/2015  . CBC with Differential    Standing Status: Future     Number of Occurrences:      Standing Expiration Date: 08/18/2015  . Haptoglobin    Standing Status: Future     Number of Occurrences:      Standing Expiration Date: 08/18/2015  . Lactate dehydrogenase    Standing Status: Future     Number of Occurrences:      Standing Expiration Date: 08/18/2015    All questions were answered. The patient knows to call the clinic with any problems, questions or concerns.     Molli Hazard, MD MD 08/18/2014 8:51 AM

## 2014-08-17 NOTE — Patient Instructions (Signed)
[place] at [facility]  Discharge Instructions: Please call with any problems or concerns prior to follow-up such as increased bruising or new bleeding. Please advise if Dr. Megan Salon needs [place] to change anything. I will see you back in 2 weeks, we will check lab again in one week. _______________________________________________________________  Thank you for choosing [place] at [facility] to provide your oncology and hematology care.  To afford each patient quality time with our providers, please arrive at least 15 minutes before your scheduled appointment.  You need to re-schedule your appointment if you arrive 10 or more minutes late.  We strive to give you quality time with our providers, and arriving late affects you and other patients whose appointments are after yours.  Also, if you no show three or more times for appointments you may be dismissed from the clinic.  Again, thank you for choosing [place] at [place] hope is that these requests will allow you access to exceptional care and in a timely manner. _______________________________________________________________  If you have questions after your visit, please contact our office at [phone] between the hours of 8:30 a.m. and 5:00 p.m. Voicemails left after 4:30 p.m. will not be returned until the following business day. _______________________________________________________________  For prescription refill requests, have your pharmacy contact our office. _______________________________________________________________  Recommendations made by the consultant and any test results will be sent to your referring physician. _______________________________________________________________

## 2014-08-18 ENCOUNTER — Encounter: Payer: Self-pay | Admitting: Internal Medicine

## 2014-08-18 ENCOUNTER — Encounter (HOSPITAL_COMMUNITY): Payer: Self-pay | Admitting: Hematology & Oncology

## 2014-08-18 ENCOUNTER — Telehealth (HOSPITAL_COMMUNITY): Payer: Self-pay | Admitting: *Deleted

## 2014-08-18 ENCOUNTER — Ambulatory Visit (INDEPENDENT_AMBULATORY_CARE_PROVIDER_SITE_OTHER): Payer: Federal, State, Local not specified - PPO | Admitting: Internal Medicine

## 2014-08-18 DIAGNOSIS — B20 Human immunodeficiency virus [HIV] disease: Secondary | ICD-10-CM

## 2014-08-18 LAB — HAPTOGLOBIN: Haptoglobin: <10 mg/dL — ABNORMAL LOW (ref 34–200)

## 2014-08-18 NOTE — Telephone Encounter (Signed)
Prednisone decreased to 20 mg BID per MD orders and patient notified

## 2014-08-18 NOTE — Assessment & Plan Note (Signed)
Pleasant 59 year old female with a long-standing history of HIV, who presented in December with a Coombs-positive hemolytic anemia. Her counts are improving on prednisone and we have been tapering her prednisone with maintenance of her counts. At her last visit she had evidence of thrombocytopenia. I suspect at this point it may be autoimmune in nature versus related to her HIV. Given her recent CD4 count I am not sure if her current hematologic problems are caused from her HIV. I will follow-up with Dr. Hale Bogus evaluation and thoughts when she sees him tomorrow.  Have advised Theresa Morrison to continue with her PCP prophylaxis, unless Dr. Megan Salon recommends changing that. We will call her with the results of her labs today if her counts continue to improve or remain stable I will taper her prednisone further. CBC will be obtained next week. I will see her back formally in 2 weeks with repeat laboratory studies.

## 2014-08-18 NOTE — Assessment & Plan Note (Signed)
Pleasant 59 year old female with a long-standing history of HIV with excellent control. She was admitted to [facility] in December with a Coombs-positive hemolytic anemia. Her counts are responding nicely to immunosuppression with prednisone. I discussed with her the based upon her blood counts today we can begin to taper her prednisone. We will continue with weekly CBCs, gradually tapering her prednisone as her counts permit. She is to continue her multivitamin and also her Protonix.  We spent some time today discussing hemolytic anemia, and I provided her with reading information. I have advised her if she has difficulties or problems prior to any follow-up with [place] she is to notify us and we can arrange for a visit sooner. Hopefully she will achieve a durable remission from prednisone, but we did discuss other treatment options today. These options were also addressed with her during her hospital stay. We will see her back again in 2 weeks, CBCs will be performed weekly as detailed.

## 2014-08-18 NOTE — Progress Notes (Signed)
Patient ID: Theresa Morrison, female   DOB: September 25, 1955, 59 y.o.   MRN: [id number]          Patient Active Problem List   Diagnosis Date Noted  . Hemolytic anemia     Priority: High  . Renal insufficiency 01/09/2012    Priority: High  . Dyslipidemia 05/11/2011    Priority: High  . Human immunodeficiency virus (HIV) disease 10/11/2006    Priority: High  . History of cardiovascular disorder 10/02/2006    Priority: High  . 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 Elevated alkaline phosphatase level 01/14/2013  . Hip pain, left 03/08/2011  . ROTATOR CUFF SYNDROME, RIGHT 07/12/2010  . 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 RECTAL BLEEDING 10/11/2006  . SYMPTOM, ABNORMAL LOSS OF WEIGHT 10/11/2006  . 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 LIPODYSTROPHY 10/02/2006  . PERIPHERAL NEUROPATHY 10/02/2006  . ARTHROSCOPY, KNEE, HX OF 10/02/2006    Patient's Medications  New Prescriptions   No medications on file  Previous Medications   ASPIRIN 81 MG TABLET    Take 81 mg by mouth daily.     ATORVASTATIN (LIPITOR) 10 MG TABLET    Take 10 mg by mouth daily.   BETA CAROTENE W/MINERALS (OCUVITE) TABLET    Take 2 tablets by mouth daily.   CALCIUM CARBONATE (OS-CAL) 600 MG TABS    Take 600 mg by mouth daily.     CLOPIDOGREL (PLAVIX) 75 MG TABLET    Take 1 tablet (75 mg total) by mouth daily.   DAPSONE 100 MG TABLET    Take 1 tablet (100 mg total) by mouth daily.   NEVIRAPINE 400 MG TB24    TAKE 1 TABLET DAILY   NITROSTAT 0.4 MG SL TABLET    Place 0.4 mg under the tongue every 5 (five) minutes as needed for chest pain.    OMEGA-3 FATTY ACIDS (FISH OIL) 1000 MG CAPS    Take 1 capsule by mouth daily.    PANTOPRAZOLE (PROTONIX) 40 MG TABLET    Take 1 tablet (40 mg total) by mouth daily.   PREDNISONE (DELTASONE) 20 MG TABLET    Take as instructed during your office visit Start with 2 tablets twice daily  Put your 50 mg tablets away for now.   TRUVADA 200-300 MG PER TABLET    TAKE 1  TABLET DAILY   ZOLPIDEM (AMBIEN) 10 MG TABLET    Take 1/2-1 tablet at bedtime as needed for trouble sleeping  Modified Medications   No medications on file  Discontinued Medications   METOPROLOL SUCCINATE (TOPROL-XL) 25 MG 24 HR TABLET    Take 25 mg by mouth daily.    Subjective: Theresa Morrison is in for her routine follow-up visit for her HIV infection. As usual she has not missed any doses of her Truvada or Viramune. At the time of her last visit I admitted her to the hospital with acute, symptomatic hemolytic anemia. She was started on high-dose prednisone 100 mg daily and is now tapered down to 60 mg daily. Overall she is feeling much better as her hemoglobin has climbed up. She does note diffuse weakness, some difficulty sleeping and blurred vision that she relates to the prednisone. She was started on dapsone recently by her hematologist, Dr. Whitney Muse, because her CD4 count had acutely dropped to 22.  Review of Systems: Pertinent items are noted in HPI.  Past Medical History  Diagnosis Date  . HIV positive 1987  . Colon polyps  adenomatous  . CVA (cerebral vascular accident)   . Shingles   . Toxoplasmosis   . Hypercholesterolemia     History  Substance Use Topics  . Smoking status: Never Smoker   . Smokeless tobacco: Never Used  . Alcohol Use: No    Family History  Problem Relation Age of Onset  . Heart attack Father   . Breast cancer Mother     mets to lung and brain    Allergies  Allergen Reactions  . Penicillins     REACTION: facial swelling  . Stavudine     REACTION: peripheral neuropathy  . Sulfamethoxazole-Trimethoprim     REACTION: hives    Objective: Temp: 98.4 F (36.9 C) (01/26 0919) Temp Source: Oral (01/26 0919) BP: 120/78 mmHg (01/26 0919) Pulse Rate: 68 (01/26 0919) Body mass index is 27.76 kg/(m^2).  General: She looks much better than on her last visit. She is in good spirits Oral: No oropharyngeal lesions Skin: No rash Lungs: Clear Cor:  Regular S1 and S2 with no murmurs Abdomen: Soft and nontender. No change in her lipodystrophy and central adiposity  Lab Results Lab Results  Component Value Date   WBC 5.1 08/17/2014   HGB 11.5* 08/17/2014   HCT 33.2* 08/17/2014   MCV 115.7* 08/17/2014   PLT 102* 08/17/2014    Lab Results  Component Value Date   CREATININE 0.88 08/17/2014   BUN 17 08/17/2014   NA 141 08/17/2014   K 3.7 08/17/2014   CL 110 08/17/2014   CO2 26 08/17/2014    Lab Results  Component Value Date   ALT 61* 08/17/2014   AST 27 08/17/2014   ALKPHOS 124* 08/17/2014   BILITOT 0.9 08/17/2014    Lab Results  Component Value Date   CHOL 141 12/30/2013   HDL 43 12/30/2013   LDLCALC 84 12/30/2013   TRIG 71 12/30/2013   CHOLHDL 3.3 12/30/2013    Lab Results HIV 1 RNA QUANT (copies/mL)  Date Value  07/13/2014 <20  12/30/2013 <20  07/01/2013 <20   CD4 T CELL ABS (/uL)  Date Value  07/31/2014 60*  07/13/2014 470  12/30/2013 330*     Assessment: Her HIV infection remains completely suppressed on her current antiretroviral regimen. She developed absolute lymphopenia coincident with her hemolytic anemia and her CD4 count is low. I agree with pneumocystis prophylaxis for now with dapsone.  Her hemolytic anemia has improved with prednisone therapy.  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  Her creatinine has been normal recently.  Plan: 1. Continue Truvada and Viramune 2. Follow-up after repeat lab work in [place] weeks   Michel Bickers, MD [facility] for [place] [phone] pager   [phone] cell 08/18/2014, 9:42 AM

## 2014-08-24 ENCOUNTER — Encounter (HOSPITAL_COMMUNITY): Payer: Federal, State, Local not specified - PPO | Attending: Hematology & Oncology

## 2014-08-24 DIAGNOSIS — D589 Hereditary hemolytic anemia, unspecified: Secondary | ICD-10-CM

## 2014-08-24 DIAGNOSIS — D598 Other acquired hemolytic anemias: Secondary | ICD-10-CM | POA: Diagnosis not present

## 2014-08-24 DIAGNOSIS — D696 Thrombocytopenia, unspecified: Secondary | ICD-10-CM

## 2014-08-24 DIAGNOSIS — D599 Acquired hemolytic anemia, unspecified: Secondary | ICD-10-CM

## 2014-08-24 LAB — CBC WITH DIFFERENTIAL/PLATELET
BASOS ABS: 0 10*3/uL (ref 0.0–0.1)
Basophils Relative: 0 % (ref 0–1)
EOS ABS: 0 10*3/uL (ref 0.0–0.7)
Eosinophils Relative: 0 % (ref 0–5)
HEMATOCRIT: 33.2 % — AB (ref 36.0–46.0)
HEMOGLOBIN: 11.2 g/dL — AB (ref 12.0–15.0)
LYMPHS PCT: 17 % (ref 12–46)
Lymphs Abs: 0.8 10*3/uL (ref 0.7–4.0)
MCH: 38.5 pg — AB (ref 26.0–34.0)
MCHC: 33.7 g/dL (ref 30.0–36.0)
MCV: 114.1 fL — AB (ref 78.0–100.0)
MONO ABS: 0.5 10*3/uL (ref 0.1–1.0)
Monocytes Relative: 10 % (ref 3–12)
Neutro Abs: 3.6 10*3/uL (ref 1.7–7.7)
Neutrophils Relative %: 73 % (ref 43–77)
Platelets: 87 10*3/uL — ABNORMAL LOW (ref 150–400)
RBC: 2.91 MIL/uL — AB (ref 3.87–5.11)
RDW: 12.4 % (ref 11.5–15.5)
WBC: 4.9 10*3/uL (ref 4.0–10.5)

## 2014-08-24 LAB — LACTATE DEHYDROGENASE: LDH: 592 U/L — ABNORMAL HIGH (ref 94–250)

## 2014-08-24 LAB — RETICULOCYTES
RBC.: 2.91 MIL/uL — ABNORMAL LOW (ref 3.87–5.11)
RETIC CT PCT: 5.9 % — AB (ref 0.4–3.1)
Retic Count, Absolute: 171.7 10*3/uL (ref 19.0–186.0)

## 2014-08-24 NOTE — Progress Notes (Signed)
Labs for hapt,retic,ldh,cbcd

## 2014-08-25 LAB — HAPTOGLOBIN: Haptoglobin: <10 mg/dL — ABNORMAL LOW (ref 34–200)

## 2014-09-07 ENCOUNTER — Encounter (HOSPITAL_COMMUNITY): Payer: Federal, State, Local not specified - PPO

## 2014-09-07 ENCOUNTER — Encounter (HOSPITAL_BASED_OUTPATIENT_CLINIC_OR_DEPARTMENT_OTHER): Payer: Federal, State, Local not specified - PPO | Admitting: Hematology & Oncology

## 2014-09-07 ENCOUNTER — Encounter (HOSPITAL_BASED_OUTPATIENT_CLINIC_OR_DEPARTMENT_OTHER): Payer: Federal, State, Local not specified - PPO

## 2014-09-07 ENCOUNTER — Encounter (HOSPITAL_COMMUNITY): Payer: Self-pay | Admitting: Hematology & Oncology

## 2014-09-07 VITALS — BP 134/83 | HR 92 | Temp 97.0°F | Resp 18 | Wt 164.5 lb

## 2014-09-07 DIAGNOSIS — D589 Hereditary hemolytic anemia, unspecified: Secondary | ICD-10-CM

## 2014-09-07 DIAGNOSIS — D696 Thrombocytopenia, unspecified: Secondary | ICD-10-CM

## 2014-09-07 DIAGNOSIS — D599 Acquired hemolytic anemia, unspecified: Secondary | ICD-10-CM

## 2014-09-07 DIAGNOSIS — D598 Other acquired hemolytic anemias: Secondary | ICD-10-CM | POA: Diagnosis not present

## 2014-09-07 LAB — CBC WITH DIFFERENTIAL/PLATELET
Basophils Absolute: 0 K/uL (ref 0.0–0.1)
Basophils Relative: 0 % (ref 0–1)
Eosinophils Absolute: 0 K/uL (ref 0.0–0.7)
Eosinophils Relative: 0 % (ref 0–5)
HCT: 35 % — ABNORMAL LOW (ref 36.0–46.0)
Hemoglobin: 11.7 g/dL — ABNORMAL LOW (ref 12.0–15.0)
Lymphocytes Relative: 15 % (ref 12–46)
Lymphs Abs: 0.6 K/uL — ABNORMAL LOW (ref 0.7–4.0)
MCH: 38.6 pg — ABNORMAL HIGH (ref 26.0–34.0)
MCHC: 33.4 g/dL (ref 30.0–36.0)
MCV: 115.5 fL — ABNORMAL HIGH (ref 78.0–100.0)
Monocytes Absolute: 0.4 K/uL (ref 0.1–1.0)
Monocytes Relative: 12 % (ref 3–12)
Neutro Abs: 2.7 K/uL (ref 1.7–7.7)
Neutrophils Relative %: 72 % (ref 43–77)
Platelets: 124 K/uL — ABNORMAL LOW (ref 150–400)
RBC: 3.03 MIL/uL — ABNORMAL LOW (ref 3.87–5.11)
RDW: 14.2 % (ref 11.5–15.5)
WBC: 3.7 K/uL — ABNORMAL LOW (ref 4.0–10.5)

## 2014-09-07 LAB — LACTATE DEHYDROGENASE: LDH: 542 U/L — ABNORMAL HIGH (ref 94–250)

## 2014-09-07 NOTE — Patient Instructions (Signed)
..  [place] at [facility] Discharge Instructions  RECOMMENDATIONS MADE BY THE CONSULTANT AND ANY TEST RESULTS WILL BE SENT TO YOUR REFERRING PHYSICIAN.  Weekly lab and see MD in 3 weeks  Thank you for choosing [place] at [facility] to provide your oncology and hematology care.  To afford each patient quality time with our provider, please arrive at least 15 minutes before your scheduled appointment time.    You need to re-schedule your appointment should you arrive 10 or more minutes late.  We strive to give you quality time with our providers, and arriving late affects you and other patients whose appointments are after yours.  Also, if you no show three or more times for appointments you may be dismissed from the clinic at the providers discretion.     Again, thank you for choosing [facility].  Our hope is that these requests will decrease the amount of time that you wait before being seen by our physicians.       _____________________________________________________________  Should you have questions after your visit to [facility], please contact our office at [phone] between the hours of 8:30 a.m. and 4:30 p.m.  Voicemails left after 4:30 p.m. will not be returned until the following business day.  For prescription refill requests, have your pharmacy contact our office.

## 2014-09-07 NOTE — Progress Notes (Signed)
[place] CONSULT NOTE  Patient Care Team: Horatio Pel, MD as PCP - General (Internal Medicine) Michel Bickers, MD as PCP - Infectious Diseases (Infectious Diseases) Molli Hazard, MD as Consulting Physician (Hematology and Oncology)  CHIEF COMPLAINTS/PURPOSE OF CONSULTATION:  Hemolytic anemia, coombs positive,  Admitted to [facility] on 07/13/2014 with Hb 3.2, reticulocyte percent at 23%, LDH 719, T bili 2.9, Haptoglobin < 25 and Coombs positive for IgG and complement  Prednisone, on slow taper  HIV positivity: on nevirapine and truvada with excellent long-term control--followed by Dr Megan Salon CD4 count of 60 on 07/31/14, prio of 470 07/14/2015  Thrombocytopenia noted on CBC 07/31/14 at 123K, down to [place] on 08/07/14  HISTORY OF PRESENTING ILLNESS:  Theresa Morrison 59 y.o. female is here because of hemolytic anemia.  He is doing well. She is currently on 40 mg of prednisone daily and is here today for lab review and hopefully to continue to taper her off her prednisone. She continues on dapsone daily. She denies cough, nausea, vomiting or change in her bowels or bladder. She does complain of joint pain and cramping which she attributes to the prednisone.  MEDICAL HISTORY:  Past Medical History  Diagnosis Date  . HIV positive 1987  . Colon polyps     adenomatous  . CVA (cerebral vascular accident)   . Shingles   . Toxoplasmosis   . Hypercholesterolemia     SURGICAL HISTORY: Past Surgical History  Procedure Laterality Date  . Tonsillectomy    . Appendectomy  2001  . Knee arthroscopy Bilateral   . Shoulder surgery Right     SOCIAL HISTORY: History   Social History  . Marital Status: Married    Spouse Name: N/A  . Number of Children: 2  . Years of Education: N/A   Occupational History  .     Social History Main Topics  . Smoking status: Never Smoker   . Smokeless tobacco: Never Used  . Alcohol Use: No  . Drug Use: No  . Sexual  Activity: Not Currently     Comment: declined condoms   Other Topics Concern  . Not on file   Social History Narrative    FAMILY HISTORY: Family History  Problem Relation Age of Onset  . Heart attack Father   . Breast cancer Mother     mets to lung and brain  . Cancer Mother    indicated that her mother is deceased. She indicated that her father is deceased.   Mother died at 36 from breast cancer Father died at 56 from MI 1 sister, 3 brothers all healthy  ALLERGIES:  is allergic to penicillins; stavudine; and sulfamethoxazole-trimethoprim.  MEDICATIONS:  Current Outpatient Prescriptions  Medication Sig Dispense Refill  . aspirin 81 MG tablet Take 81 mg by mouth daily.      Marland Kitchen atorvastatin (LIPITOR) 10 MG tablet Take 10 mg by mouth daily.    . beta carotene w/minerals (OCUVITE) tablet Take 2 tablets by mouth daily.    . calcium carbonate (OS-CAL) 600 MG TABS Take 600 mg by mouth daily.      . clopidogrel (PLAVIX) 75 MG tablet Take 1 tablet (75 mg total) by mouth daily. 90 tablet 0  . dapsone 100 MG tablet Take 1 tablet (100 mg total) by mouth daily. 30 tablet 1  . Nevirapine 400 MG TB24 TAKE 1 TABLET DAILY 90 tablet 3  . Omega-3 Fatty Acids (FISH OIL) 1000 MG CAPS Take 1 capsule by  mouth daily.     . pantoprazole (PROTONIX) 40 MG tablet Take 1 tablet (40 mg total) by mouth daily. 30 tablet 1  . predniSONE (DELTASONE) 20 MG tablet Take as instructed during your office visit Start with 2 tablets twice daily  Put your 50 mg tablets away for now. (Patient taking differently: Take 60 mg by mouth daily with breakfast. Take as instructed during your office visit Start with 2 tablets twice daily  Put your 50 mg tablets away for now.) 120 tablet 1  . TRUVADA 200-300 MG per tablet TAKE 1 TABLET DAILY 90 tablet 3  . NITROSTAT 0.4 MG SL tablet Place 0.4 mg under the tongue every 5 (five) minutes as needed for chest pain.   0  . zolpidem (AMBIEN) 10 MG tablet Take 1/2-1 tablet at bedtime as  needed for trouble sleeping (Patient not taking: Reported on 09/07/2014) 30 tablet 2   No current facility-administered medications for this visit.    Review of Systems  Constitutional: Positive for malaise/fatigue.  HENT: Negative.   Eyes: Negative.   Respiratory: Negative.   Cardiovascular: Negative.   Gastrointestinal: Negative.   Genitourinary: Negative.   Musculoskeletal: Positive for joint pain.  Skin: Negative.   Neurological: Negative.   Endo/Heme/Allergies: Negative.   Psychiatric/Behavioral: Negative.     PHYSICAL EXAMINATION:  ECOG PERFORMANCE STATUS: 1 - Symptomatic but completely ambulatory  Filed Vitals:   09/07/14 0914  BP: 134/83  Pulse: 92  Temp: 97 F (36.1 C)  Resp: 18   Filed Weights   09/07/14 0914  Weight: 164 lb 8 oz (74.617 kg)     Physical Exam  Constitutional: She is oriented to person, place, and time and well-developed, well-nourished, and in no distress.  Lipodystrophy, moon facies from prednisone  HENT:  Head: Normocephalic and atraumatic.  Nose: Nose normal.  Mouth/Throat: Oropharynx is clear and moist. No oropharyngeal exudate.  Eyes: Conjunctivae and EOM are normal. Pupils are equal, round, and reactive to light. Right eye exhibits no discharge. Left eye exhibits no discharge. No scleral icterus.  Neck: Normal range of motion. Neck supple. No tracheal deviation present. No thyromegaly present.  Cardiovascular: Normal rate, regular rhythm and normal heart sounds.  Exam reveals no gallop and no friction rub.   No murmur heard. Pulmonary/Chest: Effort normal and breath sounds normal. She has no wheezes. She has no rales.  Abdominal: Soft. Bowel sounds are normal. She exhibits no distension and no mass. There is no tenderness. There is no rebound and no guarding.  Musculoskeletal: Normal range of motion. She exhibits no edema.  Lymphadenopathy:    She has no cervical adenopathy.  Neurological: She is alert and oriented to person,  place, and time. She has normal reflexes. No cranial nerve deficit. Gait normal. Coordination normal.  Skin: Skin is warm and dry. No rash noted.  Psychiatric: Mood, memory, affect and judgment normal.  Nursing note and vitals reviewed.    LABORATORY DATA:  I have reviewed the data as listed Lab Results  Component Value Date   WBC 3.7* 09/07/2014   HGB 11.7* 09/07/2014   HCT 35.0* 09/07/2014   MCV 115.5* 09/07/2014   PLT 124* 09/07/2014     Chemistry      Component Value Date/Time   NA 141 08/17/2014 1600   K 3.7 08/17/2014 1600   CL 110 08/17/2014 1600   CO2 26 08/17/2014 1600   BUN 17 08/17/2014 1600   CREATININE 0.88 08/17/2014 1600   CREATININE 1.10 07/13/2014 1720  Component Value Date/Time   CALCIUM 8.7 08/17/2014 1600   ALKPHOS 124* 08/17/2014 1600   AST 27 08/17/2014 1600   ALT 61* 08/17/2014 1600   BILITOT 0.9 08/17/2014 1600       ASSESSMENT & PLAN:  Hemolytic anemia Present 59 year old female with HIV and hemolytic anemia. Counts today look good with continual improvement in her hemoglobin and hematocrit. Platelet count today is also improved. We are going to taper her steroids to 20 mg daily. She is sleeping better, and I anticipate with his additional taper she will also have improvement in some of her other discomforts. She has enough medication and does not need any refills. I have put her down for weekly CBC, LDH ,haptoglobin and reticulocyte count. I will continue to taper her prednisone pending her weekly counts. I will see her back again in 3 weeks, hopefully at that visit she is on minimal dose of prednisone.    Orders Placed This Encounter  Procedures  . Haptoglobin    Standing Status: Future     Number of Occurrences: 1     Standing Expiration Date: 09/07/2015  . CBC with Differential    Standing Status: Standing     Number of Occurrences: 6     Standing Expiration Date: 09/08/2015  . Lactate dehydrogenase    Standing Status: Standing      Number of Occurrences: 6     Standing Expiration Date: 09/08/2015  . Reticulocytes    Standing Status: Standing     Number of Occurrences: 6     Standing Expiration Date: 09/08/2015  . Haptoglobin    Standing Status: Standing     Number of Occurrences: 6     Standing Expiration Date: 09/08/2015    All questions were answered. The patient knows to call the clinic with any problems, questions or concerns.     Molli Hazard, MD MD 09/07/2014 10:36 AM

## 2014-09-07 NOTE — Progress Notes (Signed)
Labs for cbcd,ldh,hapto

## 2014-09-07 NOTE — Assessment & Plan Note (Signed)
Present 59 year old female with HIV and hemolytic anemia. Counts today look good with continual improvement in her hemoglobin and hematocrit. Platelet count today is also improved. We are going to taper her steroids to 20 mg daily. She is sleeping better, and I anticipate with his additional taper she will also have improvement in some of her other discomforts. She has enough medication and does not need any refills. I have put her down for weekly CBC, LDH ,haptoglobin and reticulocyte count. I will continue to taper her prednisone pending her weekly counts. I will see her back again in 3 weeks, hopefully at that visit she is on minimal dose of prednisone.

## 2014-09-08 LAB — HAPTOGLOBIN

## 2014-09-16 ENCOUNTER — Other Ambulatory Visit (HOSPITAL_COMMUNITY): Payer: Self-pay | Admitting: Hematology & Oncology

## 2014-09-17 ENCOUNTER — Encounter (HOSPITAL_BASED_OUTPATIENT_CLINIC_OR_DEPARTMENT_OTHER): Payer: Federal, State, Local not specified - PPO

## 2014-09-17 DIAGNOSIS — D589 Hereditary hemolytic anemia, unspecified: Secondary | ICD-10-CM

## 2014-09-17 DIAGNOSIS — D598 Other acquired hemolytic anemias: Secondary | ICD-10-CM | POA: Diagnosis not present

## 2014-09-17 DIAGNOSIS — D599 Acquired hemolytic anemia, unspecified: Secondary | ICD-10-CM

## 2014-09-17 LAB — CBC WITH DIFFERENTIAL/PLATELET
Basophils Absolute: 0 10*3/uL (ref 0.0–0.1)
Basophils Relative: 0 % (ref 0–1)
Eosinophils Absolute: 0 10*3/uL (ref 0.0–0.7)
Eosinophils Relative: 0 % (ref 0–5)
HCT: 36.1 % (ref 36.0–46.0)
HEMOGLOBIN: 11.5 g/dL — AB (ref 12.0–15.0)
LYMPHS ABS: 0.9 10*3/uL (ref 0.7–4.0)
Lymphocytes Relative: 30 % (ref 12–46)
MCH: 36.7 pg — AB (ref 26.0–34.0)
MCHC: 31.9 g/dL (ref 30.0–36.0)
MCV: 115.3 fL — AB (ref 78.0–100.0)
MONOS PCT: 14 % — AB (ref 3–12)
Monocytes Absolute: 0.4 10*3/uL (ref 0.1–1.0)
NEUTROS PCT: 56 % (ref 43–77)
Neutro Abs: 1.7 10*3/uL (ref 1.7–7.7)
Platelets: 123 10*3/uL — ABNORMAL LOW (ref 150–400)
RBC: 3.13 MIL/uL — AB (ref 3.87–5.11)
RDW: 13.7 % (ref 11.5–15.5)
WBC: 3 10*3/uL — ABNORMAL LOW (ref 4.0–10.5)

## 2014-09-17 LAB — RETICULOCYTES
RBC.: 3.13 MIL/uL — ABNORMAL LOW (ref 3.87–5.11)
Retic Count, Absolute: 200.3 10*3/uL — ABNORMAL HIGH (ref 19.0–186.0)
Retic Ct Pct: 6.4 % — ABNORMAL HIGH (ref 0.4–3.1)

## 2014-09-17 LAB — LACTATE DEHYDROGENASE: LDH: 473 U/L — AB (ref 94–250)

## 2014-09-17 NOTE — Progress Notes (Signed)
Labs for hapt,cbcd,ldh,retic

## 2014-09-18 ENCOUNTER — Telehealth (HOSPITAL_COMMUNITY): Payer: Self-pay | Admitting: Hematology & Oncology

## 2014-09-18 LAB — HAPTOGLOBIN: Haptoglobin: <10 mg/dL — ABNORMAL LOW (ref 34–200)

## 2014-09-21 ENCOUNTER — Other Ambulatory Visit (HOSPITAL_COMMUNITY): Payer: Self-pay | Admitting: Oncology

## 2014-09-21 ENCOUNTER — Other Ambulatory Visit (HOSPITAL_COMMUNITY): Payer: Self-pay | Admitting: Hematology & Oncology

## 2014-09-21 DIAGNOSIS — D649 Anemia, unspecified: Secondary | ICD-10-CM

## 2014-09-21 DIAGNOSIS — B20 Human immunodeficiency virus [HIV] disease: Secondary | ICD-10-CM

## 2014-09-21 DIAGNOSIS — A5609 Other chlamydial infection of lower genitourinary tract: Secondary | ICD-10-CM

## 2014-09-21 DIAGNOSIS — D539 Nutritional anemia, unspecified: Secondary | ICD-10-CM

## 2014-09-21 MED ORDER — PANTOPRAZOLE SODIUM 40 MG PO TBEC: 40.0000 mg | DELAYED_RELEASE_TABLET | Freq: Every day | ORAL | Status: DC

## 2014-09-21 NOTE — Telephone Encounter (Signed)
Patient notified that prescription for Protonix was e-scribed to her pharmacy.

## 2014-09-21 NOTE — Telephone Encounter (Signed)
Faxed to [facility] as requested. Dr.P

## 2014-09-24 ENCOUNTER — Encounter (HOSPITAL_COMMUNITY): Payer: Federal, State, Local not specified - PPO | Attending: Hematology & Oncology

## 2014-09-24 DIAGNOSIS — D598 Other acquired hemolytic anemias: Secondary | ICD-10-CM | POA: Diagnosis not present

## 2014-09-24 DIAGNOSIS — B20 Human immunodeficiency virus [HIV] disease: Secondary | ICD-10-CM | POA: Insufficient documentation

## 2014-09-24 DIAGNOSIS — D589 Hereditary hemolytic anemia, unspecified: Secondary | ICD-10-CM

## 2014-09-24 DIAGNOSIS — D599 Acquired hemolytic anemia, unspecified: Secondary | ICD-10-CM

## 2014-09-24 LAB — CBC WITH DIFFERENTIAL/PLATELET
BASOS ABS: 0 10*3/uL (ref 0.0–0.1)
Basophils Relative: 1 % (ref 0–1)
EOS PCT: 0 % (ref 0–5)
Eosinophils Absolute: 0 10*3/uL (ref 0.0–0.7)
HEMATOCRIT: 37.1 % (ref 36.0–46.0)
HEMOGLOBIN: 12.2 g/dL (ref 12.0–15.0)
LYMPHS ABS: 1.8 10*3/uL (ref 0.7–4.0)
LYMPHS PCT: 48 % — AB (ref 12–46)
MCH: 37.1 pg — ABNORMAL HIGH (ref 26.0–34.0)
MCHC: 32.9 g/dL (ref 30.0–36.0)
MCV: 112.8 fL — AB (ref 78.0–100.0)
Monocytes Absolute: 0.5 10*3/uL (ref 0.1–1.0)
Monocytes Relative: 14 % — ABNORMAL HIGH (ref 3–12)
NEUTROS ABS: 1.4 10*3/uL — AB (ref 1.7–7.7)
Neutrophils Relative %: 37 % — ABNORMAL LOW (ref 43–77)
Platelets: 121 10*3/uL — ABNORMAL LOW (ref 150–400)
RBC: 3.29 MIL/uL — AB (ref 3.87–5.11)
RDW: 13 % (ref 11.5–15.5)
WBC: 3.7 10*3/uL — AB (ref 4.0–10.5)

## 2014-09-24 LAB — LACTATE DEHYDROGENASE: LDH: 438 U/L — AB (ref 94–250)

## 2014-09-24 LAB — RETICULOCYTES
RBC.: 3.29 MIL/uL — ABNORMAL LOW (ref 3.87–5.11)
Retic Count, Absolute: 174.4 10*3/uL (ref 19.0–186.0)
Retic Ct Pct: 5.3 % — ABNORMAL HIGH (ref 0.4–3.1)

## 2014-09-24 NOTE — Progress Notes (Signed)
LABS FOR HAPT,RETIC,LDH,CBCD

## 2014-09-25 ENCOUNTER — Other Ambulatory Visit (HOSPITAL_COMMUNITY): Payer: Self-pay | Admitting: Hematology & Oncology

## 2014-09-25 LAB — HAPTOGLOBIN: Haptoglobin: <10 mg/dL — ABNORMAL LOW (ref 34–200)

## 2014-09-28 ENCOUNTER — Ambulatory Visit (HOSPITAL_COMMUNITY): Payer: Medicare Other | Admitting: Hematology & Oncology

## 2014-10-01 ENCOUNTER — Encounter (HOSPITAL_BASED_OUTPATIENT_CLINIC_OR_DEPARTMENT_OTHER): Payer: Federal, State, Local not specified - PPO

## 2014-10-01 ENCOUNTER — Other Ambulatory Visit: Payer: Federal, State, Local not specified - PPO

## 2014-10-01 ENCOUNTER — Encounter (HOSPITAL_BASED_OUTPATIENT_CLINIC_OR_DEPARTMENT_OTHER): Payer: Federal, State, Local not specified - PPO | Admitting: Hematology & Oncology

## 2014-10-01 ENCOUNTER — Encounter (HOSPITAL_COMMUNITY): Payer: Self-pay | Admitting: Hematology & Oncology

## 2014-10-01 VITALS — BP 113/77 | HR 71 | Temp 98.4°F | Resp 14 | Wt 170.4 lb

## 2014-10-01 DIAGNOSIS — D588 Other specified hereditary hemolytic anemias: Secondary | ICD-10-CM

## 2014-10-01 DIAGNOSIS — D589 Hereditary hemolytic anemia, unspecified: Secondary | ICD-10-CM

## 2014-10-01 DIAGNOSIS — B20 Human immunodeficiency virus [HIV] disease: Secondary | ICD-10-CM

## 2014-10-01 DIAGNOSIS — D598 Other acquired hemolytic anemias: Secondary | ICD-10-CM | POA: Diagnosis not present

## 2014-10-01 DIAGNOSIS — Z113 Encounter for screening for infections with a predominantly sexual mode of transmission: Secondary | ICD-10-CM

## 2014-10-01 LAB — LIPID PANEL
Cholesterol: 179 mg/dL (ref 0–200)
HDL: 72 mg/dL (ref >=46)
LDL Cholesterol: 92 mg/dL (ref 0–99)
Total CHOL/HDL Ratio: 2.5 Ratio
Triglycerides: 73 mg/dL (ref <150)
VLDL: 15 mg/dL (ref 0–40)

## 2014-10-01 LAB — CBC WITH DIFFERENTIAL/PLATELET
Basophils Absolute: 0 10*3/uL (ref 0.0–0.1)
Basophils Relative: 0 % (ref 0–1)
EOS ABS: 0 10*3/uL (ref 0.0–0.7)
EOS PCT: 1 % (ref 0–5)
HCT: 35.4 % — ABNORMAL LOW (ref 36.0–46.0)
HEMOGLOBIN: 11.6 g/dL — AB (ref 12.0–15.0)
Lymphocytes Relative: 31 % (ref 12–46)
Lymphs Abs: 1.4 10*3/uL (ref 0.7–4.0)
MCH: 36.6 pg — ABNORMAL HIGH (ref 26.0–34.0)
MCHC: 32.8 g/dL (ref 30.0–36.0)
MCV: 111.7 fL — ABNORMAL HIGH (ref 78.0–100.0)
Monocytes Absolute: 0.6 10*3/uL (ref 0.1–1.0)
Monocytes Relative: 13 % — ABNORMAL HIGH (ref 3–12)
NEUTROS ABS: 2.5 10*3/uL (ref 1.7–7.7)
Neutrophils Relative %: 56 % (ref 43–77)
PLATELETS: 143 10*3/uL — AB (ref 150–400)
RBC: 3.17 MIL/uL — ABNORMAL LOW (ref 3.87–5.11)
RDW: 11.5 % (ref 11.5–15.5)
WBC: 4.5 10*3/uL (ref 4.0–10.5)

## 2014-10-01 LAB — RETICULOCYTES
RBC.: 3.2 MIL/uL — AB (ref 3.87–5.11)
RETIC COUNT ABSOLUTE: 115.2 10*3/uL (ref 19.0–186.0)
Retic Ct Pct: 3.6 % — ABNORMAL HIGH (ref 0.4–3.1)

## 2014-10-01 LAB — RPR

## 2014-10-01 LAB — LACTATE DEHYDROGENASE: LDH: 390 U/L — ABNORMAL HIGH (ref 94–250)

## 2014-10-01 MED ORDER — DAPSONE 100 MG PO TABS: 100.0000 mg | ORAL_TABLET | Freq: Every day | ORAL | Status: DC

## 2014-10-01 MED ORDER — PREDNISONE 5 MG PO TABS: ORAL_TABLET | ORAL | Status: DC

## 2014-10-01 NOTE — Progress Notes (Signed)
Labs drawn

## 2014-10-01 NOTE — Patient Instructions (Signed)
[place] at [facility] Discharge Instructions  RECOMMENDATIONS MADE BY THE CONSULTANT AND ANY TEST RESULTS WILL BE SENT TO YOUR REFERRING PHYSICIAN.  Exam and discussion by Dr. Whitney Muse Will start tapering you off the prednisone - follow prescription instructions Dr. Whitney Muse will contact your MD to discuss the potential for using Rituxan.  Labs every 2 weeks and office visit in 4 weeks.  Thank you for choosing [place] at [facility] to provide your oncology and hematology care.  To afford each patient quality time with our provider, please arrive at least 15 minutes before your scheduled appointment time.    You need to re-schedule your appointment should you arrive 10 or more minutes late.  We strive to give you quality time with our providers, and arriving late affects you and other patients whose appointments are after yours.  Also, if you no show three or more times for appointments you may be dismissed from the clinic at the providers discretion.     Again, thank you for choosing [facility].  Our hope is that these requests will decrease the amount of time that you wait before being seen by our physicians.       _____________________________________________________________  Should you have questions after your visit to [facility], please contact our office at [phone] between the hours of 8:30 a.m. and 4:30 p.m.  Voicemails left after 4:30 p.m. will not be returned until the following business day.  For prescription refill requests, have your pharmacy contact our office.

## 2014-10-02 LAB — T-HELPER CELL (CD4) - (RCID CLINIC ONLY)
CD4 T CELL HELPER: 43 % (ref 33–55)
CD4 T Cell Abs: 470 /uL (ref 400–2700)

## 2014-10-02 LAB — HAPTOGLOBIN: Haptoglobin: <10 mg/dL — ABNORMAL LOW (ref 34–200)

## 2014-10-02 NOTE — Progress Notes (Signed)
[place] CONSULT NOTE  Patient Care Team: Deland Pretty, MD as PCP - General (Internal Medicine) Michel Bickers, MD as PCP - Infectious Diseases (Infectious Diseases) Patrici Ranks, MD as Consulting Physician (Hematology and Oncology)  CHIEF COMPLAINTS/PURPOSE OF CONSULTATION:  Hemolytic anemia, coombs positive,  Admitted to [facility] on 07/13/2014 with Hb 3.2, reticulocyte percent at 23%, LDH 719, T bili 2.9, Haptoglobin < 25 and Coombs positive for IgG and complement  Prednisone, on slow taper  HIV positivity: on nevirapine and truvada with excellent long-term control--followed by Dr Megan Salon CD4 count of 60 on 07/31/14, prio of 470 07/14/2015  Thrombocytopenia noted on CBC 07/31/14 at 123K, down to [place] on 08/07/14  HISTORY OF PRESENTING ILLNESS:  Theresa Morrison 60 y.o. female is here because of hemolytic anemia.  She is doing fairly well. A lot of the side effects she had from the prednisone are improved on lower doses but she is anxious to discontinue. She needs refills on her dapsone. She has just seen her HIV physician as well. She denies any bleeding. She denies any fever, no cough, no significant change in appetite.   MEDICAL HISTORY:  Past Medical History  Diagnosis Date  . HIV positive 1987  . Colon polyps     adenomatous  . CVA (cerebral vascular accident)   . Shingles   . Toxoplasmosis   . Hypercholesterolemia     SURGICAL HISTORY: Past Surgical History  Procedure Laterality Date  . Tonsillectomy    . Appendectomy  2001  . Knee arthroscopy Bilateral   . Shoulder surgery Right     SOCIAL HISTORY: History   Social History  . Marital Status: Married    Spouse Name: N/A  . Number of Children: 2  . Years of Education: N/A   Occupational History  .     Social History Main Topics  . Smoking status: Never Smoker   . Smokeless tobacco: Never Used  . Alcohol Use: No  . Drug Use: No  . Sexual Activity: Not Currently     Comment:  declined condoms   Other Topics Concern  . Not on file   Social History Narrative    FAMILY HISTORY: Family History  Problem Relation Age of Onset  . Heart attack Father   . Breast cancer Mother     mets to lung and brain  . Cancer Mother    indicated that her mother is deceased. She indicated that her father is deceased.   Mother died at 60 from breast cancer Father died at 82 from MI 1 sister, 3 brothers all healthy  ALLERGIES:  is allergic to penicillins; stavudine; and sulfamethoxazole-trimethoprim.  MEDICATIONS:  Current Outpatient Prescriptions  Medication Sig Dispense Refill  . aspirin 81 MG tablet Take 81 mg by mouth daily.      Marland Kitchen atorvastatin (LIPITOR) 10 MG tablet Take 10 mg by mouth daily.    . beta carotene w/minerals (OCUVITE) tablet Take 2 tablets by mouth daily.    . calcium carbonate (OS-CAL) 600 MG TABS Take 600 mg by mouth daily.      . clopidogrel (PLAVIX) 75 MG tablet Take 1 tablet (75 mg total) by mouth daily. 90 tablet 0  . dapsone 100 MG tablet Take 1 tablet (100 mg total) by mouth daily. 30 tablet 3  . Nevirapine 400 MG TB24 TAKE 1 TABLET DAILY 90 tablet 3  . NITROSTAT 0.4 MG SL tablet Place 0.4 mg under the tongue every 5 (five) minutes as  needed for chest pain.   0  . Omega-3 Fatty Acids (FISH OIL) 1000 MG CAPS Take 1 capsule by mouth daily.     . pantoprazole (PROTONIX) 40 MG tablet Take 1 tablet (40 mg total) by mouth daily. 30 tablet 1  . TRUVADA 200-300 MG per tablet TAKE 1 TABLET DAILY 90 tablet 3  . zolpidem (AMBIEN) 10 MG tablet Take 1/2-1 tablet at bedtime as needed for trouble sleeping 30 tablet 2  . predniSONE (DELTASONE) 5 MG tablet Take two tablets daily for 5 days then decrease to one tablet daily for 5 days then to one every other day for 3 doses, then off 25 tablet 1   No current facility-administered medications for this visit.    Review of Systems  Constitutional: Negative for fever, chills, weight loss and malaise/fatigue.   HENT: Negative for congestion, hearing loss, nosebleeds, sore throat and tinnitus.   Eyes: Negative for blurred vision, double vision, pain and discharge.  Respiratory: Negative for cough, hemoptysis, sputum production, shortness of breath and wheezing.   Cardiovascular: Negative for chest pain, palpitations, claudication, leg swelling and PND.  Gastrointestinal: Negative for heartburn, nausea, vomiting, abdominal pain, diarrhea, constipation, blood in stool and melena.  Genitourinary: Negative for dysuria, urgency, frequency and hematuria.  Musculoskeletal: Negative for myalgias, joint pain and falls.  Skin: Negative for itching and rash.  Neurological: Negative for dizziness, tingling, tremors, sensory change, speech change, focal weakness, seizures, loss of consciousness, weakness and headaches.  Endo/Heme/Allergies: Does not bruise/bleed easily.  Psychiatric/Behavioral: Negative for depression, suicidal ideas, memory loss and substance abuse. The patient is not nervous/anxious and does not have insomnia.     PHYSICAL EXAMINATION:  ECOG PERFORMANCE STATUS: 1 - Symptomatic but completely ambulatory  Filed Vitals:   10/01/14 1301  BP: 113/77  Pulse: 71  Temp: 98.4 F (36.9 C)  Resp: 14   Filed Weights   10/01/14 1301  Weight: 170 lb 6.4 oz (77.293 kg)     Physical Exam  Constitutional: She is oriented to person, place, and time and well-developed, well-nourished, and in no distress.  Lipodystrophy, moon facies from prednisone  HENT:  Head: Normocephalic and atraumatic.  Nose: Nose normal.  Mouth/Throat: Oropharynx is clear and moist. No oropharyngeal exudate.  Eyes: Conjunctivae and EOM are normal. Pupils are equal, round, and reactive to light. Right eye exhibits no discharge. Left eye exhibits no discharge. No scleral icterus.  Neck: Normal range of motion. Neck supple. No tracheal deviation present. No thyromegaly present.  Cardiovascular: Normal rate, regular rhythm and  normal heart sounds.  Exam reveals no gallop and no friction rub.   No murmur heard. Pulmonary/Chest: Effort normal and breath sounds normal. She has no wheezes. She has no rales.  Abdominal: Soft. Bowel sounds are normal. She exhibits no distension and no mass. There is no tenderness. There is no rebound and no guarding.  Musculoskeletal: Normal range of motion. She exhibits no edema.  Lymphadenopathy:    She has no cervical adenopathy.  Neurological: She is alert and oriented to person, place, and time. She has normal reflexes. No cranial nerve deficit. Gait normal. Coordination normal.  Skin: Skin is warm and dry. No rash noted.  Psychiatric: Mood, memory, affect and judgment normal.  Nursing note and vitals reviewed.    LABORATORY DATA:  I have reviewed the data as listed Lab Results  Component Value Date   WBC 4.5 10/01/2014   HGB 11.6* 10/01/2014   HCT 35.4* 10/01/2014   MCV 111.7* 10/01/2014  PLT 143* 10/01/2014     Chemistry      Component Value Date/Time   NA 141 08/17/2014 1600   K 3.7 08/17/2014 1600   CL 110 08/17/2014 1600   CO2 26 08/17/2014 1600   BUN 17 08/17/2014 1600   CREATININE 0.88 08/17/2014 1600   CREATININE 1.10 07/13/2014 1720      Component Value Date/Time   CALCIUM 8.7 08/17/2014 1600   ALKPHOS 124* 08/17/2014 1600   AST 27 08/17/2014 1600   ALT 61* 08/17/2014 1600   BILITOT 0.9 08/17/2014 1600       ASSESSMENT & PLAN:   Coombs positive hemolytic anemia  We are going to completely taper her off her prednisone over the next 10 days. We will keep a close monitoring of her counts. Based on all of her studies she still has evidence of mild hemolysis. I advised Theresa Morrison that this could stay stable or she could relapse. We discussed options for treatment should she relapse and I advised her that additional prednisone would not be recommended. I will send a message to October Campbell to see if she would be able to receive Rituxan if necessary.  We discussed splenectomy but I advised her that would be more of a last resort.   I will plan on seeing her back in 2-3 weeks, we will be following weekly CBCs, LDH and haptoglobin.    Orders Placed This Encounter  Procedures  . CBC with Differential    Standing Status: Future     Number of Occurrences:      Standing Expiration Date: 10/01/2015  . Comprehensive metabolic panel    Standing Status: Future     Number of Occurrences:      Standing Expiration Date: 10/01/2015  . Lactate dehydrogenase    Standing Status: Future     Number of Occurrences:      Standing Expiration Date: 10/01/2015  . Haptoglobin    Standing Status: Future     Number of Occurrences:      Standing Expiration Date: 10/01/2015  . Ferritin    Standing Status: Future     Number of Occurrences:      Standing Expiration Date: 10/01/2015  . Vitamin B12    Standing Status: Future     Number of Occurrences:      Standing Expiration Date: 10/01/2015  . Folate    Standing Status: Future     Number of Occurrences:      Standing Expiration Date: 10/01/2015  . CBC with Differential    Standing Status: Future     Number of Occurrences:      Standing Expiration Date: 10/01/2015  . Lactate dehydrogenase    Standing Status: Future     Number of Occurrences:      Standing Expiration Date: 10/01/2015  . Haptoglobin    Standing Status: Future     Number of Occurrences:      Standing Expiration Date: 10/01/2015    All questions were answered. The patient knows to call the clinic with any problems, questions or concerns.     Molli Hazard, MD MD 10/02/2014 4:39 PM

## 2014-10-04 LAB — HIV-1 RNA QUANT-NO REFLEX-BLD
HIV 1 RNA Quant: <20 copies/mL (ref <20)
HIV-1 RNA Quant, Log: <1.3 {Log} (ref <1.30)

## 2014-10-05 ENCOUNTER — Telehealth: Payer: Self-pay | Admitting: Internal Medicine

## 2014-10-05 NOTE — Telephone Encounter (Signed)
-----   Message from Patrici Ranks, MD sent at 10/02/2014  4:47 PM EST ----- Theresa Morrison,  I am the new oncologist at [facility] and have been working with [name] in regards to her hemolytic anemia.  I am tapering her steroids completely off, I am somewhat concerned she may relapse because she still has evidence of hemolysis on labs.  Would Rituxan be ok to give her if needed?  Thanks [employer]

## 2014-10-05 NOTE — Telephone Encounter (Signed)
[name],  Thekla's HIV infection has been under excellent control for many years. I think Rituxan would be okay if she needs it.  Acen Craun ===View-only below this line===  ----- Message -----    From: Patrici Ranks, MD    Sent: 10/02/2014   4:47 PM      To: Michel Bickers, MD  Theresa Morrison,  I am the new oncologist at [facility] and have been working with [name] in regards to her hemolytic anemia.  I am tapering her steroids completely off, I am somewhat concerned she may relapse because she still has evidence of hemolysis on labs.  Would Rituxan be ok to give her if needed?  Thanks [employer]

## 2014-10-08 LAB — HLA B*5701: HLA-B*5701 w/rflx HLA-B High: NEGATIVE

## 2014-10-15 ENCOUNTER — Encounter (HOSPITAL_BASED_OUTPATIENT_CLINIC_OR_DEPARTMENT_OTHER): Payer: Federal, State, Local not specified - PPO

## 2014-10-15 DIAGNOSIS — B20 Human immunodeficiency virus [HIV] disease: Secondary | ICD-10-CM

## 2014-10-15 DIAGNOSIS — D588 Other specified hereditary hemolytic anemias: Secondary | ICD-10-CM | POA: Diagnosis not present

## 2014-10-15 DIAGNOSIS — D598 Other acquired hemolytic anemias: Secondary | ICD-10-CM | POA: Diagnosis not present

## 2014-10-15 DIAGNOSIS — D589 Hereditary hemolytic anemia, unspecified: Secondary | ICD-10-CM

## 2014-10-15 LAB — CBC WITH DIFFERENTIAL/PLATELET
BASOS ABS: 0 10*3/uL (ref 0.0–0.1)
BASOS PCT: 0 % (ref 0–1)
Eosinophils Absolute: 0 10*3/uL (ref 0.0–0.7)
Eosinophils Relative: 0 % (ref 0–5)
HCT: 35.8 % — ABNORMAL LOW (ref 36.0–46.0)
Hemoglobin: 11.8 g/dL — ABNORMAL LOW (ref 12.0–15.0)
Lymphocytes Relative: 29 % (ref 12–46)
Lymphs Abs: 0.8 10*3/uL (ref 0.7–4.0)
MCH: 34.8 pg — ABNORMAL HIGH (ref 26.0–34.0)
MCHC: 33 g/dL (ref 30.0–36.0)
MCV: 105.6 fL — ABNORMAL HIGH (ref 78.0–100.0)
MONO ABS: 0.3 10*3/uL (ref 0.1–1.0)
Monocytes Relative: 11 % (ref 3–12)
NEUTROS PCT: 60 % (ref 43–77)
Neutro Abs: 1.6 10*3/uL — ABNORMAL LOW (ref 1.7–7.7)
Platelets: 139 10*3/uL — ABNORMAL LOW (ref 150–400)
RBC: 3.39 MIL/uL — ABNORMAL LOW (ref 3.87–5.11)
RDW: 12.3 % (ref 11.5–15.5)
WBC: 2.7 10*3/uL — ABNORMAL LOW (ref 4.0–10.5)

## 2014-10-15 LAB — VITAMIN B12: VITAMIN B 12: 656 pg/mL (ref 211–911)

## 2014-10-15 LAB — COMPREHENSIVE METABOLIC PANEL
ALT: 58 U/L — AB (ref 0–35)
AST: 41 U/L — ABNORMAL HIGH (ref 0–37)
Albumin: 3.8 g/dL (ref 3.5–5.2)
Alkaline Phosphatase: 147 U/L — ABNORMAL HIGH (ref 39–117)
Anion gap: 6 (ref 5–15)
BILIRUBIN TOTAL: 0.7 mg/dL (ref 0.3–1.2)
BUN: 8 mg/dL (ref 6–23)
CO2: 28 mmol/L (ref 19–32)
CREATININE: 1.06 mg/dL (ref 0.50–1.10)
Calcium: 8.5 mg/dL (ref 8.4–10.5)
Chloride: 107 mmol/L (ref 96–112)
GFR, EST AFRICAN AMERICAN: 66 mL/min — AB (ref >90)
GFR, EST NON AFRICAN AMERICAN: 57 mL/min — AB (ref >90)
GLUCOSE: 82 mg/dL (ref 70–99)
Potassium: 3 mmol/L — ABNORMAL LOW (ref 3.5–5.1)
Sodium: 141 mmol/L (ref 135–145)
TOTAL PROTEIN: 5.7 g/dL — AB (ref 6.0–8.3)

## 2014-10-15 LAB — FOLATE: Folate: >20 ng/mL

## 2014-10-15 LAB — LACTATE DEHYDROGENASE: LDH: 374 U/L — ABNORMAL HIGH (ref 94–250)

## 2014-10-15 LAB — FERRITIN: Ferritin: 324 ng/mL — ABNORMAL HIGH (ref 10–291)

## 2014-10-15 NOTE — Progress Notes (Signed)
Labs drawn

## 2014-10-16 LAB — HAPTOGLOBIN

## 2014-10-19 ENCOUNTER — Other Ambulatory Visit (HOSPITAL_COMMUNITY): Payer: Self-pay

## 2014-10-19 MED ORDER — POTASSIUM CHLORIDE ER 10 MEQ PO TBCR: 10.0000 meq | EXTENDED_RELEASE_TABLET | Freq: Two times a day (BID) | ORAL | Status: DC

## 2014-10-20 ENCOUNTER — Ambulatory Visit (INDEPENDENT_AMBULATORY_CARE_PROVIDER_SITE_OTHER): Payer: Federal, State, Local not specified - PPO | Admitting: Internal Medicine

## 2014-10-20 ENCOUNTER — Encounter: Payer: Self-pay | Admitting: Internal Medicine

## 2014-10-20 VITALS — BP 113/75 | HR 75 | Temp 97.6°F | Wt 171.2 lb

## 2014-10-20 DIAGNOSIS — G4700 Insomnia, unspecified: Secondary | ICD-10-CM

## 2014-10-20 DIAGNOSIS — B20 Human immunodeficiency virus [HIV] disease: Secondary | ICD-10-CM

## 2014-10-20 MED ORDER — ZOLPIDEM TARTRATE 10 MG PO TABS: ORAL_TABLET | ORAL | Status: DC

## 2014-10-20 NOTE — Progress Notes (Signed)
Patient ID: Theresa Morrison, female   DOB: 10-19-1955, 59 y.o.   MRN: [id number]          Patient Active Problem List   Diagnosis Date Noted  . Hemolytic anemia     Priority: High  . Renal insufficiency 01/09/2012    Priority: High  . Dyslipidemia 05/11/2011    Priority: High  . Human immunodeficiency virus (HIV) disease 10/11/2006    Priority: High  . History of cardiovascular disorder 10/02/2006    Priority: High  . 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 Elevated alkaline phosphatase level 01/14/2013  . Hip pain, left 03/08/2011  . ROTATOR CUFF SYNDROME, RIGHT 07/12/2010  . 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 RECTAL BLEEDING 10/11/2006  . SYMPTOM, ABNORMAL LOSS OF WEIGHT 10/11/2006  . 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 LIPODYSTROPHY 10/02/2006  . PERIPHERAL NEUROPATHY 10/02/2006  . ARTHROSCOPY, KNEE, HX OF 10/02/2006    Patient's Medications  New Prescriptions   No medications on file  Previous Medications   ASPIRIN 81 MG TABLET    Take 81 mg by mouth daily.     ATORVASTATIN (LIPITOR) 10 MG TABLET    Take 10 mg by mouth daily.   BETA CAROTENE W/MINERALS (OCUVITE) TABLET    Take 2 tablets by mouth daily.   CALCIUM CARBONATE (OS-CAL) 600 MG TABS    Take 600 mg by mouth daily.     CLOPIDOGREL (PLAVIX) 75 MG TABLET    Take 1 tablet (75 mg total) by mouth daily.   NEVIRAPINE 400 MG TB24    TAKE 1 TABLET DAILY   NITROSTAT 0.4 MG SL TABLET    Place 0.4 mg under the tongue every 5 (five) minutes as needed for chest pain.    OMEGA-3 FATTY ACIDS (FISH OIL) 1000 MG CAPS    Take 1 capsule by mouth daily.    PANTOPRAZOLE (PROTONIX) 40 MG TABLET    Take 1 tablet (40 mg total) by mouth daily.   POTASSIUM CHLORIDE (K-DUR) 10 MEQ TABLET    Take 1 tablet (10 mEq total) by mouth 2 (two) times daily.   TRUVADA 200-300 MG PER TABLET    TAKE 1 TABLET DAILY  Modified Medications   Modified Medication Previous Medication   ZOLPIDEM (AMBIEN) 10 MG TABLET zolpidem  (AMBIEN) 10 MG tablet      Take 1/2-1 tablet at bedtime as needed for trouble sleeping    Take 1/2-1 tablet at bedtime as needed for trouble sleeping  Discontinued Medications   DAPSONE 100 MG TABLET    Take 1 tablet (100 mg total) by mouth daily.   PREDNISONE (DELTASONE) 5 MG TABLET    Take two tablets daily for 5 days then decrease to one tablet daily for 5 days then to one every other day for 3 doses, then off    Subjective: Theresa Morrison is in for her routine visit. She tapered off of her prednisone recently and is feeling much better. She still has a "fog horn noise" in her years that started shortly after beginning prednisone. His most noticeable at night when she is trying to sleep. She recently was seen by an ENT physician and was told that everything was normal. She has not missed any doses of her Truvada or Viramune.  Review of Systems: Pertinent items are noted in HPI.  Past Medical History  Diagnosis Date  . HIV positive 1987  . Colon polyps     adenomatous  . CVA (cerebral vascular accident)   .  Shingles   . Toxoplasmosis   . Hypercholesterolemia     History  Substance Use Topics  . Smoking status: Never Smoker   . Smokeless tobacco: Never Used  . Alcohol Use: No    Family History  Problem Relation Age of Onset  . Heart attack Father   . Breast cancer Mother     mets to lung and brain  . Cancer Mother     Allergies  Allergen Reactions  . Penicillins     REACTION: facial swelling  . Stavudine     REACTION: peripheral neuropathy  . Sulfamethoxazole-Trimethoprim     REACTION: hives    Objective: Temp: 97.6 F (36.4 C) (03/29 1013) Temp Source: Oral (03/29 1013) BP: 113/75 mmHg (03/29 1013) Pulse Rate: 75 (03/29 1013) Body mass index is 29.16 kg/(m^2).  General: She is smiling and in good spirits Oral: No oropharyngeal lesions Skin: No rash Lungs: Clear Cor: Regular S1 and S2 with no murmur  Lab Results Lab Results  Component Value Date   WBC  2.7* 10/15/2014   HGB 11.8* 10/15/2014   HCT 35.8* 10/15/2014   MCV 105.6* 10/15/2014   PLT 139* 10/15/2014    Lab Results  Component Value Date   CREATININE 1.06 10/15/2014   BUN 8 10/15/2014   NA 141 10/15/2014   K 3.0* 10/15/2014   CL 107 10/15/2014   CO2 28 10/15/2014    Lab Results  Component Value Date   ALT 58* 10/15/2014   AST 41* 10/15/2014   ALKPHOS 147* 10/15/2014   BILITOT 0.7 10/15/2014    Lab Results  Component Value Date   CHOL 179 10/01/2014   HDL 72 10/01/2014   LDLCALC 92 10/01/2014   TRIG 73 10/01/2014   CHOLHDL 2.5 10/01/2014    Lab Results HIV 1 RNA QUANT (copies/mL)  Date Value  10/01/2014 <20  07/13/2014 <20  12/30/2013 <20   CD4 T CELL ABS (/uL)  Date Value  10/01/2014 470  07/31/2014 60*  07/13/2014 470     Assessment: Her HIV infection remains under excellent control and she's had prompt CD4 reconstitution after her bout with hemolytic anemia. She has no evidence of relapse of her hemolysis at this point. She can stop taking dapsone now.  Plan: 1. Continue Truvada and Viramune 2. Discontinue dapsone 3. Follow-up after lab work in [place] months   Michel Bickers, MD [facility] for [place] [phone] pager   [phone] cell 10/20/2014, 10:29 AM

## 2014-11-03 ENCOUNTER — Ambulatory Visit (HOSPITAL_COMMUNITY)
Admission: RE | Admit: 2014-11-03 | Discharge: 2014-11-03 | Disposition: A | Discharge status: Home / Self Care | Payer: Federal, State, Local not specified - PPO | Source: Ambulatory Visit | Attending: Hematology & Oncology | Admitting: Hematology & Oncology

## 2014-11-03 ENCOUNTER — Encounter (HOSPITAL_BASED_OUTPATIENT_CLINIC_OR_DEPARTMENT_OTHER): Payer: Federal, State, Local not specified - PPO | Admitting: Hematology & Oncology

## 2014-11-03 ENCOUNTER — Encounter (HOSPITAL_COMMUNITY): Payer: Federal, State, Local not specified - PPO | Attending: Hematology & Oncology

## 2014-11-03 ENCOUNTER — Encounter (HOSPITAL_COMMUNITY): Payer: Self-pay | Admitting: Hematology & Oncology

## 2014-11-03 ENCOUNTER — Ambulatory Visit (HOSPITAL_COMMUNITY): Payer: Medicare Other | Admitting: Hematology & Oncology

## 2014-11-03 VITALS — BP 109/64 | HR 69 | Temp 98.0°F | Resp 18 | Wt 173.0 lb

## 2014-11-03 DIAGNOSIS — M7989 Other specified soft tissue disorders: Secondary | ICD-10-CM | POA: Diagnosis present

## 2014-11-03 DIAGNOSIS — B20 Human immunodeficiency virus [HIV] disease: Secondary | ICD-10-CM

## 2014-11-03 DIAGNOSIS — D588 Other specified hereditary hemolytic anemias: Secondary | ICD-10-CM

## 2014-11-03 DIAGNOSIS — D599 Acquired hemolytic anemia, unspecified: Secondary | ICD-10-CM | POA: Insufficient documentation

## 2014-11-03 DIAGNOSIS — D589 Hereditary hemolytic anemia, unspecified: Secondary | ICD-10-CM

## 2014-11-03 LAB — CBC WITH DIFFERENTIAL/PLATELET
Basophils Absolute: 0 10*3/uL (ref 0.0–0.1)
Basophils Relative: 1 % (ref 0–1)
EOS ABS: 0 10*3/uL (ref 0.0–0.7)
EOS PCT: 0 % (ref 0–5)
HCT: 37.4 % (ref 36.0–46.0)
Hemoglobin: 12.6 g/dL (ref 12.0–15.0)
Lymphocytes Relative: 36 % (ref 12–46)
Lymphs Abs: 1.1 10*3/uL (ref 0.7–4.0)
MCH: 34.5 pg — ABNORMAL HIGH (ref 26.0–34.0)
MCHC: 33.7 g/dL (ref 30.0–36.0)
MCV: 102.5 fL — ABNORMAL HIGH (ref 78.0–100.0)
Monocytes Absolute: 0.4 10*3/uL (ref 0.1–1.0)
Monocytes Relative: 11 % (ref 3–12)
NEUTROS PCT: 52 % (ref 43–77)
Neutro Abs: 1.6 10*3/uL — ABNORMAL LOW (ref 1.7–7.7)
PLATELETS: 174 10*3/uL (ref 150–400)
RBC: 3.65 MIL/uL — ABNORMAL LOW (ref 3.87–5.11)
RDW: 12.3 % (ref 11.5–15.5)
WBC: 3.1 10*3/uL — ABNORMAL LOW (ref 4.0–10.5)

## 2014-11-03 LAB — LACTATE DEHYDROGENASE: LDH: 329 U/L — ABNORMAL HIGH (ref 94–250)

## 2014-11-03 NOTE — Patient Instructions (Addendum)
[place] at [facility] Discharge Instructions  RECOMMENDATIONS MADE BY THE CONSULTANT AND ANY TEST RESULTS WILL BE SENT TO YOUR REFERRING PHYSICIAN.  Exam and discussion by Dr. Whitney Muse. Will do ultrasound of your leg.  If there are any problems we will let you know.  Call with any concerns or issues Follow-up in 1 month with labs and office visit.    Thank you for choosing [place] at [facility] to provide your oncology and hematology care.  To afford each patient quality time with our provider, please arrive at least 15 minutes before your scheduled appointment time.    You need to re-schedule your appointment should you arrive 10 or more minutes late.  We strive to give you quality time with our providers, and arriving late affects you and other patients whose appointments are after yours.  Also, if you no show three or more times for appointments you may be dismissed from the clinic at the providers discretion.     Again, thank you for choosing [facility].  Our hope is that these requests will decrease the amount of time that you wait before being seen by our physicians.       _____________________________________________________________  Should you have questions after your visit to [facility], please contact our office at [phone] between the hours of 8:30 a.m. and 4:30 p.m.  Voicemails left after 4:30 p.m. will not be returned until the following business day.  For prescription refill requests, have your pharmacy contact our office.

## 2014-11-03 NOTE — Progress Notes (Signed)
Labs drawn

## 2014-11-04 LAB — HAPTOGLOBIN

## 2014-11-29 ENCOUNTER — Encounter (HOSPITAL_COMMUNITY): Payer: Self-pay | Admitting: Hematology & Oncology

## 2014-11-29 NOTE — Progress Notes (Signed)
[place] Progress Note Patient Care Team: Deland Pretty, MD as PCP - General (Internal Medicine) Michel Bickers, MD as PCP - Infectious Diseases (Infectious Diseases) Patrici Ranks, MD as Consulting Physician (Hematology and Oncology)  CHIEF COMPLAINTS/PURPOSE OF CONSULTATION:  Hemolytic anemia, coombs positive,  Admitted to [facility] on 07/13/2014 with Hb 3.2, reticulocyte percent at 23%, LDH 719, T bili 2.9, Haptoglobin < 25 and Coombs positive for IgG and complement  Prednisone, now dicontinued  HIV positivity: on nevirapine and truvada with excellent long-term control--followed by Dr Megan Salon CD4 count of 60 on 07/31/14, prio of 470 07/14/2015  Thrombocytopenia noted on CBC 07/31/14 at 123K, down to [place] on 08/07/14  HISTORY OF PRESENTING ILLNESS:  LASHAWNTA Morrison 59 y.o. female is here because of hemolytic anemia.  She is off her prednisone. She is doing much better in regards to energy.  She denies chest pain or SOB.  Since discontinuing her steroids she has noticed some mild joint pain but she thinks it is improving. She has LLE swelling which is new and prominent.   Appetite is baseline without change.   MEDICAL HISTORY:  Past Medical History  Diagnosis Date  . HIV positive 1987  . Colon polyps     adenomatous  . CVA (cerebral vascular accident)   . Shingles   . Toxoplasmosis   . Hypercholesterolemia     SURGICAL HISTORY: Past Surgical History  Procedure Laterality Date  . Tonsillectomy    . Appendectomy  2001  . Knee arthroscopy Bilateral   . Shoulder surgery Right     SOCIAL HISTORY: History   Social History  . Marital Status: Married    Spouse Name: N/A  . Number of Children: 2  . Years of Education: N/A   Occupational History  .     Social History Main Topics  . Smoking status: Never Smoker   . Smokeless tobacco: Never Used  . Alcohol Use: No  . Drug Use: No  . Sexual Activity: Not Currently     Comment: declined condoms    Other Topics Concern  . Not on file   Social History Narrative    FAMILY HISTORY: Family History  Problem Relation Age of Onset  . Heart attack Father   . Breast cancer Mother     mets to lung and brain  . Cancer Mother    indicated that her mother is deceased. She indicated that her father is deceased.   Mother died at 4 from breast cancer Father died at 75 from MI 1 sister, 3 brothers all healthy  ALLERGIES:  is allergic to penicillins; stavudine; and sulfamethoxazole-trimethoprim.  MEDICATIONS:  Current Outpatient Prescriptions  Medication Sig Dispense Refill  . aspirin 81 MG tablet Take 81 mg by mouth daily.      Marland Kitchen atorvastatin (LIPITOR) 10 MG tablet Take 10 mg by mouth daily.    . beta carotene w/minerals (OCUVITE) tablet Take 2 tablets by mouth daily.    . calcium carbonate (OS-CAL) 600 MG TABS Take 600 mg by mouth daily.      . clopidogrel (PLAVIX) 75 MG tablet Take 1 tablet (75 mg total) by mouth daily. 90 tablet 0  . Nevirapine 400 MG TB24 TAKE 1 TABLET DAILY 90 tablet 3  . NITROSTAT 0.4 MG SL tablet Place 0.4 mg under the tongue every 5 (five) minutes as needed for chest pain.   0  . Omega-3 Fatty Acids (FISH OIL) 1000 MG CAPS Take 1 capsule by mouth  daily.     . pantoprazole (PROTONIX) 40 MG tablet Take 1 tablet (40 mg total) by mouth daily. 30 tablet 1  . potassium chloride (K-DUR) 10 MEQ tablet Take 1 tablet (10 mEq total) by mouth 2 (two) times daily. 60 tablet 2  . TRUVADA 200-300 MG per tablet TAKE 1 TABLET DAILY 90 tablet 3  . zolpidem (AMBIEN) 10 MG tablet Take 1/2-1 tablet at bedtime as needed for trouble sleeping 30 tablet 2   No current facility-administered medications for this visit.    Review of Systems  Constitutional: Negative for fever, chills, weight loss and malaise/fatigue.  HENT: Negative for congestion, hearing loss, nosebleeds, sore throat and tinnitus.   Eyes: Negative for blurred vision, double vision, pain and discharge.   Respiratory: Negative for cough, hemoptysis, sputum production, shortness of breath and wheezing.   Cardiovascular: Negative for chest pain, palpitations, claudication, leg swelling and PND.  Gastrointestinal: Negative for heartburn, nausea, vomiting, abdominal pain, diarrhea, constipation, blood in stool and melena.  Genitourinary: Negative for dysuria, urgency, frequency and hematuria.  Musculoskeletal: Positive for joint pain. Negative for myalgias and falls.  Skin: Negative for itching and rash.  Neurological: Negative for dizziness, tingling, tremors, sensory change, speech change, focal weakness, seizures, loss of consciousness, weakness and headaches.  Endo/Heme/Allergies: Does not bruise/bleed easily.  Psychiatric/Behavioral: Negative for depression, suicidal ideas, memory loss and substance abuse. The patient is not nervous/anxious and does not have insomnia.     PHYSICAL EXAMINATION:  ECOG PERFORMANCE STATUS: 1 - Symptomatic but completely ambulatory  Filed Vitals:   11/03/14 1315  BP: 109/64  Pulse: 69  Temp: 98 F (36.7 C)  Resp: 18   Filed Weights   11/03/14 1315  Weight: 173 lb (78.472 kg)     Physical Exam  Constitutional: She is oriented to person, place, and time and well-developed, well-nourished, and in no distress.  Lipodystrophy, moon facies from prednisone  HENT:  Head: Normocephalic and atraumatic.  Nose: Nose normal.  Mouth/Throat: Oropharynx is clear and moist. No oropharyngeal exudate.  Eyes: Conjunctivae and EOM are normal. Pupils are equal, round, and reactive to light. Right eye exhibits no discharge. Left eye exhibits no discharge. No scleral icterus.  Neck: Normal range of motion. Neck supple. No tracheal deviation present. No thyromegaly present.  Cardiovascular: Normal rate, regular rhythm and normal heart sounds.  Exam reveals no gallop and no friction rub.   No murmur heard. Pulmonary/Chest: Effort normal and breath sounds normal. She has  no wheezes. She has no rales.  Abdominal: Soft. Bowel sounds are normal. She exhibits no distension and no mass. There is no tenderness. There is no rebound and no guarding.  Musculoskeletal: Normal range of motion. She exhibits edema.  LLE > RLE, no erythema or warmth  Lymphadenopathy:    She has no cervical adenopathy.  Neurological: She is alert and oriented to person, place, and time. She has normal reflexes. No cranial nerve deficit. Gait normal. Coordination normal.  Skin: Skin is warm and dry. No rash noted.  Psychiatric: Mood, memory, affect and judgment normal.  Nursing note and vitals reviewed.    LABORATORY DATA:  I have reviewed the data as listed Lab Results  Component Value Date   WBC 3.1* 11/03/2014   HGB 12.6 11/03/2014   HCT 37.4 11/03/2014   MCV 102.5* 11/03/2014   PLT 174 11/03/2014     Chemistry      Component Value Date/Time   NA 141 10/15/2014 1051   K 3.0* 10/15/2014  1051   CL 107 10/15/2014 1051   CO2 28 10/15/2014 1051   BUN 8 10/15/2014 1051   CREATININE 1.06 10/15/2014 1051   CREATININE 1.10 07/13/2014 1720      Component Value Date/Time   CALCIUM 8.5 10/15/2014 1051   ALKPHOS 147* 10/15/2014 1051   AST 41* 10/15/2014 1051   ALT 58* 10/15/2014 1051   BILITOT 0.7 10/15/2014 1051      RADIOLOGY:  CLINICAL DATA: Left lower extremity swelling  EXAM: LEFT LOWER EXTREMITY VENOUS DOPPLER ULTRASOUND IMPRESSION: No evidence of deep venous thrombosis of the left leg. There is slight soft tissue edema in the left calf.   Electronically Signed  By: Lorriane Shire M.D.  On: 11/03/2014 14:58  ASSESSMENT & PLAN:   Coombs positive hemolytic anemia HIV Left lower extremity swelling  She has evidence of hemolysis based upon LDH, haptoglobin and reticulocyte count. But her CBC has normalized. Counts today are good. We checked a lower extremity ultrasound probe leg swelling and it was negative for DVT. I have advised her to let us know if  she develops any worsening swelling, redness, or pain. We will check a CBC monthly. If she relapsed I advised her have spoken with her HIV physician, Dr. Michel Bickers and he feels she would be able to tolerate Rituxan. I am hoping that she does not relapse however.  I will plan on seeing her back in 2-3 weeks, we will be following monthly CBCs, LDH and haptoglobin.   Orders Placed This Encounter  Procedures  . US Venous Img Lower Unilateral Left    JH/AMY   MEDICARE/BCBS    PT TO RETURN TO CLINIC IF POSITIVE, SEND HOME IF NEGATIVE/CALL REPORT    Standing Status: Future     Number of Occurrences: 1     Standing Expiration Date: 11/03/2015    Order Specific Question:  Reason for Exam (SYMPTOM  OR DIAGNOSIS REQUIRED)    Answer:  LLE swelling and discomfort    Order Specific Question:  Preferred imaging location?    Answer:  [facility]  . CBC with Differential    Standing Status: Standing     Number of Occurrences: 6     Standing Expiration Date: 11/02/2016  . Lactate dehydrogenase    Standing Status: Standing     Number of Occurrences: 6     Standing Expiration Date: 11/02/2016  . Comprehensive metabolic panel    Standing Status: Standing     Number of Occurrences: 6     Standing Expiration Date: 11/02/2016  . Haptoglobin    Standing Status: Standing     Number of Occurrences: 6     Standing Expiration Date: 11/02/2016  . Reticulocytes    Standing Status: Standing     Number of Occurrences: 6     Standing Expiration Date: 11/02/2016    All questions were answered. The patient knows to call the clinic with any problems, questions or concerns This note was electronically signed.     Molli Hazard, MD  11/29/2014 8:50 PM

## 2014-12-02 ENCOUNTER — Encounter (HOSPITAL_COMMUNITY): Payer: Federal, State, Local not specified - PPO | Attending: Hematology & Oncology | Admitting: Oncology

## 2014-12-02 ENCOUNTER — Encounter (HOSPITAL_COMMUNITY): Payer: Federal, State, Local not specified - PPO

## 2014-12-02 ENCOUNTER — Ambulatory Visit (HOSPITAL_COMMUNITY)
Admission: RE | Admit: 2014-12-02 | Discharge: 2014-12-02 | Disposition: A | Discharge status: Home / Self Care | Payer: Federal, State, Local not specified - PPO | Source: Ambulatory Visit | Attending: Oncology | Admitting: Oncology

## 2014-12-02 ENCOUNTER — Encounter (HOSPITAL_COMMUNITY): Payer: Self-pay | Admitting: Oncology

## 2014-12-02 VITALS — BP 105/65 | HR 74 | Temp 98.7°F | Resp 16 | Wt 174.1 lb

## 2014-12-02 DIAGNOSIS — M79605 Pain in left leg: Secondary | ICD-10-CM | POA: Insufficient documentation

## 2014-12-02 DIAGNOSIS — D599 Acquired hemolytic anemia, unspecified: Secondary | ICD-10-CM | POA: Diagnosis not present

## 2014-12-02 DIAGNOSIS — B20 Human immunodeficiency virus [HIV] disease: Secondary | ICD-10-CM | POA: Insufficient documentation

## 2014-12-02 DIAGNOSIS — D589 Hereditary hemolytic anemia, unspecified: Secondary | ICD-10-CM

## 2014-12-02 DIAGNOSIS — R600 Localized edema: Secondary | ICD-10-CM

## 2014-12-02 DIAGNOSIS — D598 Other acquired hemolytic anemias: Secondary | ICD-10-CM | POA: Diagnosis not present

## 2014-12-02 LAB — COMPREHENSIVE METABOLIC PANEL
ALBUMIN: 4.4 g/dL (ref 3.5–5.0)
ALK PHOS: 242 U/L — AB (ref 38–126)
ALT: 26 U/L (ref 14–54)
ANION GAP: 10 (ref 5–15)
AST: 27 U/L (ref 15–41)
BUN: 17 mg/dL (ref 6–20)
CO2: 26 mmol/L (ref 22–32)
Calcium: 9 mg/dL (ref 8.9–10.3)
Chloride: 106 mmol/L (ref 101–111)
Creatinine, Ser: 1.41 mg/dL — ABNORMAL HIGH (ref 0.44–1.00)
GFR calc Af Amer: 47 mL/min — ABNORMAL LOW (ref >60)
GFR calc non Af Amer: 40 mL/min — ABNORMAL LOW (ref >60)
Glucose, Bld: 77 mg/dL (ref 70–99)
POTASSIUM: 3.7 mmol/L (ref 3.5–5.1)
SODIUM: 142 mmol/L (ref 135–145)
TOTAL PROTEIN: 6.4 g/dL — AB (ref 6.5–8.1)
Total Bilirubin: 0.5 mg/dL (ref 0.3–1.2)

## 2014-12-02 LAB — CBC WITH DIFFERENTIAL/PLATELET
Basophils Absolute: 0 10*3/uL (ref 0.0–0.1)
Basophils Relative: 1 % (ref 0–1)
EOS PCT: 0 % (ref 0–5)
Eosinophils Absolute: 0 10*3/uL (ref 0.0–0.7)
HCT: 37.1 % (ref 36.0–46.0)
Hemoglobin: 12.6 g/dL (ref 12.0–15.0)
LYMPHS PCT: 30 % (ref 12–46)
Lymphs Abs: 1 10*3/uL (ref 0.7–4.0)
MCH: 33 pg (ref 26.0–34.0)
MCHC: 34 g/dL (ref 30.0–36.0)
MCV: 97.1 fL (ref 78.0–100.0)
MONO ABS: 0.3 10*3/uL (ref 0.1–1.0)
MONOS PCT: 10 % (ref 3–12)
Neutro Abs: 1.9 10*3/uL (ref 1.7–7.7)
Neutrophils Relative %: 59 % (ref 43–77)
PLATELETS: 170 10*3/uL (ref 150–400)
RBC: 3.82 MIL/uL — AB (ref 3.87–5.11)
RDW: 12.1 % (ref 11.5–15.5)
WBC: 3.2 10*3/uL — ABNORMAL LOW (ref 4.0–10.5)

## 2014-12-02 LAB — RETICULOCYTES
RBC.: 3.82 MIL/uL — AB (ref 3.87–5.11)
RETIC COUNT ABSOLUTE: 57.3 10*3/uL (ref 19.0–186.0)
RETIC CT PCT: 1.5 % (ref 0.4–3.1)

## 2014-12-02 LAB — LACTATE DEHYDROGENASE: LDH: 282 U/L — AB (ref 98–192)

## 2014-12-02 NOTE — Assessment & Plan Note (Addendum)
Coombs positive (IgG and complement) hemolytic anemia treated with Prednisone with remission in the setting of HIV positivity on Nevirapine and Truvada under the guidance of Dr. Megan Salon.  Labs today: CBC diff, CMET, LDH, Haptoglobin, and Retic Count.  Repeat labs in 4 weeks with return visit.  I will order a left LE [place] to evaluate for DVT.  Last month the test was negative, but the patient is going to the [place] next week and would prefer to verify that it is still negative, which is reasonable.

## 2014-12-02 NOTE — Patient Instructions (Signed)
..  [place] at [facility] Discharge Instructions  RECOMMENDATIONS MADE BY THE CONSULTANT AND ANY TEST RESULTS WILL BE SENT TO YOUR REFERRING PHYSICIAN.  Exam with Robynn Pane, PA-C. Ultrasound of left leg today.  Will call with results. Labs today.  Will call with results. Lab work again in 4 weeks and follow up appointment. Call with any new or worsening symptoms, questions, or concerns.   Thank you for choosing [place] at [facility] to provide your oncology and hematology care.  To afford each patient quality time with our provider, please arrive at least 15 minutes before your scheduled appointment time.    You need to re-schedule your appointment should you arrive 10 or more minutes late.  We strive to give you quality time with our providers, and arriving late affects you and other patients whose appointments are after yours.  Also, if you no show three or more times for appointments you may be dismissed from the clinic at the providers discretion.     Again, thank you for choosing [facility].  Our hope is that these requests will decrease the amount of time that you wait before being seen by our physicians.       _____________________________________________________________  Should you have questions after your visit to [facility], please contact our office at [phone] between the hours of 8:30 a.m. and 4:30 p.m.  Voicemails left after 4:30 p.m. will not be returned until the following business day.  For prescription refill requests, have your pharmacy contact our office.

## 2014-12-02 NOTE — Progress Notes (Signed)
Theresa Pel, MD [address] [place] [address]  Hemolytic anemia  Leg edema, left - Plan: US Venous Img Lower Unilateral Left  CURRENT THERAPY: Observation  INTERVAL HISTORY: Theresa Morrison 59 y.o. 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 Megan Salon.  I personally reviewed and went over laboratory results with the patient.  The results are noted within this dictation.  We will update labs today.  She reports continued left leg edema that is localized to her lower leg, ankle, and foot.  Left leg edema is > Right.  She denies any tenderness, but notes the sensation of aches when she is on her feet all day.  She denies any past injury to take particular leg, ankle, or foot.  She reports that it is dependent and improves by AM.  [place] and after a discussion, she wants to have the US performed today to verify that there is not a DVT.  Past doppler was negative.  She notes that the edema is similar and not worse.  She was started on a "fluid pill" and this has not helped.   Hematologically, she otherwise denies any complaints.  Past Medical History  Diagnosis Date  . HIV positive 1987  . Colon polyps     adenomatous  . CVA (cerebral vascular accident)   . Shingles   . Toxoplasmosis   . Hypercholesterolemia     has Human immunodeficiency virus (HIV) disease; HERPES ZOSTER; HSV; CHLAMYDIA TRACHOMATIS, LOWER GU; LIPODYSTROPHY; PERIPHERAL NEUROPATHY; RECTAL BLEEDING; SYMPTOM, ABNORMAL LOSS OF WEIGHT; History of cardiovascular disorder; ARTHROSCOPY, KNEE, HX OF; ROTATOR CUFF SYNDROME, RIGHT; Hip pain, left; Dyslipidemia; Renal insufficiency; Nontoxic thyroid nodule; Elevated alkaline phosphatase level; and Hemolytic anemia on her problem list.     is  allergic to penicillins; stavudine; and sulfamethoxazole-trimethoprim.  Ms. Placzek does not currently have medications on file.  Past Surgical History  Procedure Laterality Date  . Tonsillectomy    . Appendectomy  2001  . Knee arthroscopy Bilateral   . Shoulder surgery Right     Denies any headaches, dizziness, double vision, fevers, chills, night sweats, nausea, vomiting, diarrhea, constipation, chest pain, heart palpitations, shortness of breath, blood in stool, black tarry stool, urinary pain, urinary burning, urinary frequency, hematuria.   PHYSICAL EXAMINATION  ECOG PERFORMANCE STATUS: 0 - Asymptomatic  Filed Vitals:   12/02/14 1421  BP: 105/65  Pulse: 74  Temp: 98.7 F (37.1 C)  Resp: 16    GENERAL:alert, no distress, well nourished, well developed, comfortable, cooperative, obese and smiling SKIN: skin color, texture, turgor are normal, no rashes or significant lesions HEAD: Normocephalic, No masses, lesions, tenderness or abnormalities EYES: normal, PERRLA, EOMI, Conjunctiva are pink and non-injected EARS: External ears normal OROPHARYNX:lips, buccal mucosa, and tongue normal and mucous membranes are moist  NECK: supple, no adenopathy, thyroid normal size, non-tender, without nodularity, no stridor, non-tender, trachea midline LYMPH:  not examined BREAST:not examined LUNGS: clear to auscultation and percussion HEART: regular rate & rhythm, no murmurs, no gallops, S1 normal and S2 normal ABDOMEN:abdomen soft, non-tender, obese and normal bowel sounds BACK: Back symmetric, no curvature. EXTREMITIES:less then 2 second capillary refill, no joint deformities, effusion, or inflammation, no skin discoloration, no cyanosis, positive findings:  edema 2+ pitting edema in left lower leg, ankle and  foot with 1+ in right.  No erythema, heat, or pain on palpation.  No popliteal pain.  Negative Homan's sign.  NEURO: alert & oriented x 3 with fluent speech, no focal motor/sensory  deficits, gait normal   LABORATORY DATA: CBC    Component Value Date/Time   WBC 3.1* 11/03/2014 1327   RBC 3.65* 11/03/2014 1327   RBC 3.20* 10/01/2014 1251   HGB 12.6 11/03/2014 1327   HCT 37.4 11/03/2014 1327   PLT 174 11/03/2014 1327   MCV 102.5* 11/03/2014 1327   MCH 34.5* 11/03/2014 1327   MCHC 33.7 11/03/2014 1327   RDW 12.3 11/03/2014 1327   LYMPHSABS 1.1 11/03/2014 1327   MONOABS 0.4 11/03/2014 1327   EOSABS 0.0 11/03/2014 1327   BASOSABS 0.0 11/03/2014 1327      Chemistry      Component Value Date/Time   NA 141 10/15/2014 1051   K 3.0* 10/15/2014 1051   CL 107 10/15/2014 1051   CO2 28 10/15/2014 1051   BUN 8 10/15/2014 1051   CREATININE 1.06 10/15/2014 1051   CREATININE 1.10 07/13/2014 1720      Component Value Date/Time   CALCIUM 8.5 10/15/2014 1051   ALKPHOS 147* 10/15/2014 1051   AST 41* 10/15/2014 1051   ALT 58* 10/15/2014 1051   BILITOT 0.7 10/15/2014 1051      RADIOGRAPHIC STUDIES:  US Venous Img Lower Unilateral Left  11/03/2014   CLINICAL DATA:  Left lower extremity swelling  EXAM: LEFT LOWER EXTREMITY VENOUS DOPPLER ULTRASOUND  TECHNIQUE: Gray-scale sonography with graded compression, as well as color Doppler and duplex ultrasound were performed to evaluate the lower extremity deep venous systems from the level of the common femoral vein and including the common femoral, femoral, profunda femoral, popliteal and calf veins including the posterior tibial, peroneal and gastrocnemius veins when visible. The superficial great saphenous vein was also interrogated. Spectral Doppler was utilized to evaluate flow at rest and with distal augmentation maneuvers in the common femoral, femoral and popliteal veins.  COMPARISON:  None.  FINDINGS: Contralateral Common Femoral Vein: Respiratory phasicity is normal and symmetric with the symptomatic side. No evidence of thrombus. Normal compressibility.  Common Femoral Vein: No evidence of thrombus. Normal  compressibility, respiratory phasicity and response to augmentation.  Saphenofemoral Junction: No evidence of thrombus. Normal compressibility and flow on color Doppler imaging.  Profunda Femoral Vein: No evidence of thrombus. Normal compressibility and flow on color Doppler imaging.  Femoral Vein: No evidence of thrombus. Normal compressibility, respiratory phasicity and response to augmentation.  Popliteal Vein: No evidence of thrombus. Normal compressibility, respiratory phasicity and response to augmentation.  Calf Veins: No evidence of thrombus. Normal compressibility and flow on color Doppler imaging.  Superficial Great Saphenous Vein: No evidence of thrombus. Normal compressibility and flow on color Doppler imaging.  Venous Reflux:  None.  Other Findings:  None.  IMPRESSION: No evidence of deep venous thrombosis of the left leg. There is slight soft tissue edema in the left calf.   Electronically Signed   By: Lorriane Shire M.D.   On: 11/03/2014 14:58      ASSESSMENT AND PLAN:  Hemolytic anemia Coombs positive (IgG and complement) hemolytic anemia treated with Prednisone with remission in the setting of HIV positivity on Nevirapine and Truvada under the guidance of Dr. Megan Salon.  Labs today: CBC diff, CMET, LDH, Haptoglobin, and Retic Count.  Repeat labs in 4 weeks with return visit.  I will order a left LE [place] to evaluate for DVT.  Last month the test was negative, but the patient is going to the [place] next week and would prefer to verify that it is still negative, which is reasonable.      THERAPY PLAN:  Continue close surveillance of labs.  All questions were answered. The patient knows to call the clinic with any problems, questions or concerns. We can certainly see the patient much sooner if necessary.  Patient and plan discussed with Dr. Ancil Linsey and she is in agreement with the aforementioned.   This note is electronically signed by: Robynn Pane 12/02/2014 2:56 PM

## 2014-12-03 LAB — HAPTOGLOBIN

## 2014-12-14 ENCOUNTER — Other Ambulatory Visit (HOSPITAL_COMMUNITY): Payer: Self-pay | Admitting: Family Medicine

## 2014-12-14 ENCOUNTER — Ambulatory Visit (HOSPITAL_COMMUNITY)
Admission: RE | Admit: 2014-12-14 | Discharge: 2014-12-14 | Disposition: A | Discharge status: Home / Self Care | Payer: Federal, State, Local not specified - PPO | Source: Ambulatory Visit | Attending: Family Medicine | Admitting: Family Medicine

## 2014-12-14 DIAGNOSIS — M25551 Pain in right hip: Secondary | ICD-10-CM

## 2014-12-14 DIAGNOSIS — W19XXXA Unspecified fall, initial encounter: Secondary | ICD-10-CM | POA: Insufficient documentation

## 2014-12-14 DIAGNOSIS — M47898 Other spondylosis, sacral and sacrococcygeal region: Secondary | ICD-10-CM | POA: Diagnosis not present

## 2015-01-05 ENCOUNTER — Encounter (HOSPITAL_COMMUNITY): Payer: Self-pay | Admitting: Hematology & Oncology

## 2015-01-05 ENCOUNTER — Encounter (HOSPITAL_COMMUNITY): Payer: Federal, State, Local not specified - PPO | Attending: Hematology & Oncology | Admitting: Hematology & Oncology

## 2015-01-05 ENCOUNTER — Encounter (HOSPITAL_BASED_OUTPATIENT_CLINIC_OR_DEPARTMENT_OTHER): Payer: Federal, State, Local not specified - PPO

## 2015-01-05 VITALS — BP 103/71 | HR 78 | Temp 98.7°F | Resp 15 | Wt 168.7 lb

## 2015-01-05 DIAGNOSIS — D598 Other acquired hemolytic anemias: Secondary | ICD-10-CM | POA: Diagnosis not present

## 2015-01-05 DIAGNOSIS — D589 Hereditary hemolytic anemia, unspecified: Secondary | ICD-10-CM

## 2015-01-05 DIAGNOSIS — B20 Human immunodeficiency virus [HIV] disease: Secondary | ICD-10-CM | POA: Insufficient documentation

## 2015-01-05 DIAGNOSIS — M7989 Other specified soft tissue disorders: Secondary | ICD-10-CM

## 2015-01-05 DIAGNOSIS — D599 Acquired hemolytic anemia, unspecified: Secondary | ICD-10-CM

## 2015-01-05 LAB — COMPREHENSIVE METABOLIC PANEL
ALT: 20 U/L (ref 14–54)
ANION GAP: 6 (ref 5–15)
AST: 28 U/L (ref 15–41)
Albumin: 4.3 g/dL (ref 3.5–5.0)
Alkaline Phosphatase: 257 U/L — ABNORMAL HIGH (ref 38–126)
BILIRUBIN TOTAL: 0.4 mg/dL (ref 0.3–1.2)
BUN: 7 mg/dL (ref 6–20)
CO2: 24 mmol/L (ref 22–32)
Calcium: 9.1 mg/dL (ref 8.9–10.3)
Chloride: 114 mmol/L — ABNORMAL HIGH (ref 101–111)
Creatinine, Ser: 1.27 mg/dL — ABNORMAL HIGH (ref 0.44–1.00)
GFR, EST AFRICAN AMERICAN: 53 mL/min — AB (ref >60)
GFR, EST NON AFRICAN AMERICAN: 46 mL/min — AB (ref >60)
Glucose, Bld: 80 mg/dL (ref 65–99)
POTASSIUM: 3.9 mmol/L (ref 3.5–5.1)
Sodium: 144 mmol/L (ref 135–145)
Total Protein: 6.5 g/dL (ref 6.5–8.1)

## 2015-01-05 LAB — CBC WITH DIFFERENTIAL/PLATELET
BASOS ABS: 0 10*3/uL (ref 0.0–0.1)
Basophils Relative: 0 % (ref 0–1)
Eosinophils Absolute: 0 10*3/uL (ref 0.0–0.7)
Eosinophils Relative: 0 % (ref 0–5)
HEMATOCRIT: 37.3 % (ref 36.0–46.0)
Hemoglobin: 13.1 g/dL (ref 12.0–15.0)
LYMPHS PCT: 34 % (ref 12–46)
Lymphs Abs: 0.9 10*3/uL (ref 0.7–4.0)
MCH: 33.3 pg (ref 26.0–34.0)
MCHC: 35.1 g/dL (ref 30.0–36.0)
MCV: 94.9 fL (ref 78.0–100.0)
MONO ABS: 0.3 10*3/uL (ref 0.1–1.0)
MONOS PCT: 9 % (ref 3–12)
Neutro Abs: 1.6 10*3/uL — ABNORMAL LOW (ref 1.7–7.7)
Neutrophils Relative %: 57 % (ref 43–77)
Platelets: 145 10*3/uL — ABNORMAL LOW (ref 150–400)
RBC: 3.93 MIL/uL (ref 3.87–5.11)
RDW: 12.2 % (ref 11.5–15.5)
WBC: 2.8 10*3/uL — AB (ref 4.0–10.5)

## 2015-01-05 LAB — LACTATE DEHYDROGENASE: LDH: 219 U/L — ABNORMAL HIGH (ref 98–192)

## 2015-01-05 LAB — RETICULOCYTES
RBC.: 3.93 MIL/uL (ref 3.87–5.11)
Retic Count, Absolute: 43.2 10*3/uL (ref 19.0–186.0)
Retic Ct Pct: 1.1 % (ref 0.4–3.1)

## 2015-01-05 NOTE — Patient Instructions (Signed)
Theresa Morrison at [facility] Discharge Instructions  RECOMMENDATIONS MADE BY THE CONSULTANT AND ANY TEST RESULTS WILL BE SENT TO YOUR REFERRING PHYSICIAN.  Exam and discussion by Dr. Whitney Muse If any concerns with your labs we will call you. Call with any questions or concerns.  Follow-up in 2 months with labs and office visit.  Thank you for choosing [place] at [facility] to provide your oncology and hematology care.  To afford each patient quality time with our provider, please arrive at least 15 minutes before your scheduled appointment time.    You need to re-schedule your appointment should you arrive 10 or more minutes late.  We strive to give you quality time with our providers, and arriving late affects you and other patients whose appointments are after yours.  Also, if you no show three or more times for appointments you may be dismissed from the clinic at the providers discretion.     Again, thank you for choosing [facility].  Our hope is that these requests will decrease the amount of time that you wait before being seen by our physicians.       _____________________________________________________________  Should you have questions after your visit to [facility], please contact our office at [phone] between the hours of 8:30 a.m. and 4:30 p.m.  Voicemails left after 4:30 p.m. will not be returned until the following business day.  For prescription refill requests, have your pharmacy contact our office.

## 2015-01-05 NOTE — Progress Notes (Signed)
LABS DRAWN

## 2015-01-05 NOTE — Progress Notes (Signed)
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 female is here because of hemolytic anemia.  She is off her prednisone. She is doing much better in regards to energy.  She denies chest pain or SOB.  Since discontinuing her steroids she has noticed some mild joint pain but she thinks it is improving. She has LLE swelling which is new and prominent.   She has had two LLE U/S which have been negative for DVT. She has begun wearing compression hose that have dramatically helped.   Her last mammogram was done last year. She hasn't scheduled a new one for this year. She has her gynecologist order them, she will be due in July.   MEDICAL HISTORY:  Past Medical History  Diagnosis Date  . HIV positive 1987  . Colon polyps     adenomatous  . CVA (cerebral vascular accident)   . Shingles   . Toxoplasmosis   . Hypercholesterolemia     SURGICAL HISTORY: Past Surgical History  Procedure Laterality Date  . Tonsillectomy    . Appendectomy  2001  . Knee arthroscopy Bilateral   . Shoulder surgery Right     SOCIAL HISTORY: History   Social History  . Marital Status: Married    Spouse Name: N/A  . Number of Children: 2  . Years of Education: N/A   Occupational History  .     Social History Main Topics  .  Smoking status: Never Smoker   . Smokeless tobacco: Never Used  . Alcohol Use: No  . Drug Use: No  . Sexual Activity: Not Currently     Comment: declined condoms   Other Topics Concern  . Not on file   Social History Narrative    FAMILY HISTORY: Family History  Problem Relation Age of Onset  . Heart attack Father   . Breast cancer Mother     mets to lung and brain  . Cancer Mother    indicated that her mother is deceased. She indicated that her father is deceased.   Mother died at 77 from breast cancer Father died at 45 from MI 1 sister, 3 brothers all healthy  ALLERGIES:  is allergic to penicillins; stavudine; and sulfamethoxazole-trimethoprim.  MEDICATIONS:  Current Outpatient Prescriptions  Medication Sig Dispense Refill  . acetaminophen (TYLENOL) 500 MG tablet Take 500 mg by mouth every 6 (six) hours as needed.    Marland Kitchen aspirin 81 MG tablet Take 81 mg by mouth daily.      Marland Kitchen atorvastatin (LIPITOR) 10 MG tablet Take 10 mg by mouth daily.    . beta carotene w/minerals (OCUVITE) tablet Take 2 tablets by mouth daily.    . calcium carbonate (OS-CAL) 600 MG TABS Take 600 mg by mouth daily.      . clopidogrel (PLAVIX) 75 MG tablet Take 1 tablet (75 mg total) by mouth  daily. 90 tablet 0  . Nevirapine 400 MG TB24 TAKE 1 TABLET DAILY 90 tablet 3  . NITROSTAT 0.4 MG SL tablet Place 0.4 mg under the tongue every 5 (five) minutes as needed for chest pain.   0  . Omega-3 Fatty Acids (FISH OIL) 1000 MG CAPS Take 1 capsule by mouth daily.     . potassium chloride (K-DUR) 10 MEQ tablet Take 1 tablet (10 mEq total) by mouth 2 (two) times daily. 60 tablet 2  . TRUVADA 200-300 MG per tablet TAKE 1 TABLET DAILY 90 tablet 3  . zolpidem (AMBIEN) 10 MG tablet Take 1/2-1 tablet at bedtime as needed for trouble sleeping 30 tablet 2  . furosemide (LASIX) 40 MG tablet Take 40 mg by mouth daily as needed.  1  . pantoprazole (PROTONIX) 40 MG tablet Take 1 tablet (40 mg total) by mouth daily. (Patient  not taking: Reported on 01/05/2015) 30 tablet 1   No current facility-administered medications for this visit.    Review of Systems  Constitutional: Negative for fever, chills, weight loss and malaise/fatigue.  HENT: Negative for congestion, hearing loss, nosebleeds, sore throat and tinnitus.   Eyes: Negative for blurred vision, double vision, pain and discharge.  Respiratory: Negative for cough, hemoptysis, sputum production, shortness of breath and wheezing.   Cardiovascular: Positive for leg swelling. Negative for chest pain, palpitations, claudication, and PND.      Managed with compression stockings. Gastrointestinal: Negative for heartburn, nausea, vomiting, abdominal pain, diarrhea, constipation, blood in stool and melena.  Genitourinary: Negative for dysuria, urgency, frequency and hematuria.  Musculoskeletal: Positive for joint pain. Negative for myalgias and falls.  Skin: Negative for itching and rash.  Neurological: Negative for dizziness, tingling, tremors, sensory change, speech change, focal weakness, seizures, loss of consciousness, weakness and headaches.  Endo/Heme/Allergies: Does not bruise/bleed easily.  Psychiatric/Behavioral: Negative for depression, suicidal ideas, memory loss and substance abuse. The patient is not nervous/anxious and does not have insomnia.     PHYSICAL EXAMINATION: ECOG PERFORMANCE STATUS: 1 - Symptomatic but completely ambulatory  Filed Vitals:   01/05/15 1241  BP: 103/71  Pulse: 78  Temp: 98.7 F (37.1 C)  Resp: 15   Filed Weights   01/05/15 1241  Weight: 168 lb 11.2 oz (76.522 kg)     Physical Exam  Constitutional: She is oriented to person, place, and time and well-developed, well-nourished, and in no distress.  Lipodystrophy HENT:  Head: Normocephalic and atraumatic.  Nose: Nose normal.  Mouth/Throat: Oropharynx is clear and moist. No oropharyngeal exudate.  Eyes: Conjunctivae and EOM are normal. Pupils are equal, round, and  reactive to light. Right eye exhibits no discharge. Left eye exhibits no discharge. No scleral icterus.  Neck: Normal range of motion. Neck supple. No tracheal deviation present. No thyromegaly present.  Cardiovascular: Normal rate, regular rhythm and normal heart sounds.  Exam reveals no gallop and no friction rub.   No murmur heard. Pulmonary/Chest: Effort normal and breath sounds normal. She has no wheezes. She has no rales.  Abdominal: Soft. Bowel sounds are normal. She exhibits no distension and no mass. There is no tenderness. There is no rebound and no guarding.  Musculoskeletal: Normal range of motion. She exhibits edema.  LLE > RLE, no erythema or warmth  Lymphadenopathy:    She has no cervical adenopathy.  Neurological: She is alert and oriented to person, place, and time. She has normal reflexes. No cranial nerve deficit. Gait normal. Coordination normal.  Skin: Skin is warm and  dry. No rash noted.  Psychiatric: Mood, memory, affect and judgment normal.  Nursing note and vitals reviewed.    LABORATORY DATA:  I have reviewed the data as listed Lab Results  Component Value Date   WBC 2.8* 01/05/2015   HGB 13.1 01/05/2015   HCT 37.3 01/05/2015   MCV 94.9 01/05/2015   PLT 145* 01/05/2015     Chemistry      Component Value Date/Time   NA 144 01/05/2015 1233   K 3.9 01/05/2015 1233   CL 114* 01/05/2015 1233   CO2 24 01/05/2015 1233   BUN 7 01/05/2015 1233   CREATININE 1.27* 01/05/2015 1233   CREATININE 1.10 07/13/2014 1720      Component Value Date/Time   CALCIUM 9.1 01/05/2015 1233   ALKPHOS 257* 01/05/2015 1233   AST 28 01/05/2015 1233   ALT 20 01/05/2015 1233   BILITOT 0.4 01/05/2015 1233      RADIOLOGY:  CLINICAL DATA: Left lower extremity swelling  EXAM: LEFT LOWER EXTREMITY VENOUS DOPPLER ULTRASOUND IMPRESSION: No evidence of deep venous thrombosis of the left leg. There is slight soft tissue edema in the left calf.   Electronically Signed   By: Lorriane Shire M.D.  On: 11/03/2014 14:58  ASSESSMENT & PLAN:   Coombs positive hemolytic anemia HIV Left lower extremity swelling  Counts today are good. I reviewed her labs with her. Haptoglobin and LDH are pending.  We are planning on monthly CBC's and follow-up in 2 months. I advised her that based upon her labs over the next 2 months we will consider moving lab visits and office visits out further.  Her LE edema has been improving. Since compression hose help I have advised her to continue wearing them as needed. She will have a mammogram in July.   She knows to call the clinic for any significant problems such as worsening fatigue or SOB. She is extremely compliant.  All questions were answered. The patient knows to call the clinic with any problems, questions or concerns  This document serves as a record of services personally performed by Ancil Linsey, MD. It was created on her behalf by Arlyce Harman, a trained medical scribe. The creation of this record is based on the scribe's personal observations and the provider's statements to them. This document has been checked and approved by the attending provider.  I have reviewed the above documentation for accuracy and completeness, and I agree with the above.  This note was electronically signed.    Molli Hazard, MD  01/05/2015 8:05 PM

## 2015-01-06 LAB — HAPTOGLOBIN: HAPTOGLOBIN: 20 mg/dL — AB (ref 34–200)

## 2015-01-21 ENCOUNTER — Other Ambulatory Visit: Payer: Self-pay | Admitting: Internal Medicine

## 2015-02-04 ENCOUNTER — Encounter (HOSPITAL_COMMUNITY): Payer: Federal, State, Local not specified - PPO | Attending: Hematology & Oncology

## 2015-02-04 DIAGNOSIS — B20 Human immunodeficiency virus [HIV] disease: Secondary | ICD-10-CM | POA: Insufficient documentation

## 2015-02-04 DIAGNOSIS — D598 Other acquired hemolytic anemias: Secondary | ICD-10-CM | POA: Insufficient documentation

## 2015-02-04 DIAGNOSIS — D589 Hereditary hemolytic anemia, unspecified: Secondary | ICD-10-CM

## 2015-02-04 LAB — LACTATE DEHYDROGENASE: LDH: 202 U/L — AB (ref 98–192)

## 2015-02-04 LAB — CBC WITH DIFFERENTIAL/PLATELET
BASOS ABS: 0 10*3/uL (ref 0.0–0.1)
Basophils Relative: 0 % (ref 0–1)
EOS ABS: 0 10*3/uL (ref 0.0–0.7)
Eosinophils Relative: 0 % (ref 0–5)
HCT: 36.7 % (ref 36.0–46.0)
Hemoglobin: 12.7 g/dL (ref 12.0–15.0)
LYMPHS PCT: 31 % (ref 12–46)
Lymphs Abs: 0.8 10*3/uL (ref 0.7–4.0)
MCH: 32.6 pg (ref 26.0–34.0)
MCHC: 34.6 g/dL (ref 30.0–36.0)
MCV: 94.3 fL (ref 78.0–100.0)
MONOS PCT: 10 % (ref 3–12)
Monocytes Absolute: 0.2 10*3/uL (ref 0.1–1.0)
Neutro Abs: 1.5 10*3/uL — ABNORMAL LOW (ref 1.7–7.7)
Neutrophils Relative %: 59 % (ref 43–77)
Platelets: 144 10*3/uL — ABNORMAL LOW (ref 150–400)
RBC: 3.89 MIL/uL (ref 3.87–5.11)
RDW: 12.6 % (ref 11.5–15.5)
WBC: 2.5 10*3/uL — ABNORMAL LOW (ref 4.0–10.5)

## 2015-02-04 LAB — COMPREHENSIVE METABOLIC PANEL
ALBUMIN: 4.2 g/dL (ref 3.5–5.0)
ALT: 17 U/L (ref 14–54)
ANION GAP: 7 (ref 5–15)
AST: 20 U/L (ref 15–41)
Alkaline Phosphatase: 280 U/L — ABNORMAL HIGH (ref 38–126)
BILIRUBIN TOTAL: 0.5 mg/dL (ref 0.3–1.2)
BUN: 12 mg/dL (ref 6–20)
CALCIUM: 8.8 mg/dL — AB (ref 8.9–10.3)
CO2: 24 mmol/L (ref 22–32)
Chloride: 111 mmol/L (ref 101–111)
Creatinine, Ser: 1.18 mg/dL — ABNORMAL HIGH (ref 0.44–1.00)
GFR, EST AFRICAN AMERICAN: 58 mL/min — AB (ref >60)
GFR, EST NON AFRICAN AMERICAN: 50 mL/min — AB (ref >60)
Glucose, Bld: 88 mg/dL (ref 65–99)
POTASSIUM: 3.5 mmol/L (ref 3.5–5.1)
Sodium: 142 mmol/L (ref 135–145)
TOTAL PROTEIN: 6.3 g/dL — AB (ref 6.5–8.1)

## 2015-02-04 LAB — RETICULOCYTES
RBC.: 3.89 MIL/uL (ref 3.87–5.11)
Retic Count, Absolute: 50.6 10*3/uL (ref 19.0–186.0)
Retic Ct Pct: 1.3 % (ref 0.4–3.1)

## 2015-02-05 LAB — HAPTOGLOBIN: Haptoglobin: 34 mg/dL (ref 34–200)

## 2015-02-11 ENCOUNTER — Encounter (HOSPITAL_COMMUNITY): Payer: Self-pay | Admitting: Hematology & Oncology

## 2015-02-12 ENCOUNTER — Other Ambulatory Visit: Payer: Self-pay | Admitting: Obstetrics and Gynecology

## 2015-02-15 LAB — CYTOLOGY - PAP

## 2015-03-09 ENCOUNTER — Encounter (HOSPITAL_COMMUNITY): Payer: Federal, State, Local not specified - PPO | Attending: Hematology & Oncology | Admitting: Hematology & Oncology

## 2015-03-09 ENCOUNTER — Encounter (HOSPITAL_COMMUNITY): Payer: Federal, State, Local not specified - PPO

## 2015-03-09 VITALS — BP 111/61 | HR 68 | Temp 98.6°F | Resp 16 | Wt 168.8 lb

## 2015-03-09 DIAGNOSIS — B20 Human immunodeficiency virus [HIV] disease: Secondary | ICD-10-CM | POA: Diagnosis not present

## 2015-03-09 DIAGNOSIS — D589 Hereditary hemolytic anemia, unspecified: Secondary | ICD-10-CM

## 2015-03-09 DIAGNOSIS — D598 Other acquired hemolytic anemias: Secondary | ICD-10-CM | POA: Insufficient documentation

## 2015-03-09 DIAGNOSIS — M2540 Effusion, unspecified joint: Secondary | ICD-10-CM | POA: Diagnosis not present

## 2015-03-09 DIAGNOSIS — D599 Acquired hemolytic anemia, unspecified: Secondary | ICD-10-CM | POA: Diagnosis not present

## 2015-03-09 LAB — COMPREHENSIVE METABOLIC PANEL
ALT: 20 U/L (ref 14–54)
AST: 24 U/L (ref 15–41)
Albumin: 4.5 g/dL (ref 3.5–5.0)
Alkaline Phosphatase: 281 U/L — ABNORMAL HIGH (ref 38–126)
Anion gap: 7 (ref 5–15)
BILIRUBIN TOTAL: 0.5 mg/dL (ref 0.3–1.2)
BUN: 11 mg/dL (ref 6–20)
CHLORIDE: 107 mmol/L (ref 101–111)
CO2: 26 mmol/L (ref 22–32)
CREATININE: 1.25 mg/dL — AB (ref 0.44–1.00)
Calcium: 8.7 mg/dL — ABNORMAL LOW (ref 8.9–10.3)
GFR calc Af Amer: 54 mL/min — ABNORMAL LOW (ref >60)
GFR, EST NON AFRICAN AMERICAN: 47 mL/min — AB (ref >60)
GLUCOSE: 86 mg/dL (ref 65–99)
Potassium: 3.7 mmol/L (ref 3.5–5.1)
Sodium: 140 mmol/L (ref 135–145)
TOTAL PROTEIN: 6.8 g/dL (ref 6.5–8.1)

## 2015-03-09 LAB — CBC WITH DIFFERENTIAL/PLATELET
Basophils Absolute: 0 10*3/uL (ref 0.0–0.1)
Basophils Relative: 0 % (ref 0–1)
Eosinophils Absolute: 0 10*3/uL (ref 0.0–0.7)
Eosinophils Relative: 0 % (ref 0–5)
HCT: 38 % (ref 36.0–46.0)
HEMOGLOBIN: 13.2 g/dL (ref 12.0–15.0)
LYMPHS ABS: 0.8 10*3/uL (ref 0.7–4.0)
LYMPHS PCT: 30 % (ref 12–46)
MCH: 33.6 pg (ref 26.0–34.0)
MCHC: 34.7 g/dL (ref 30.0–36.0)
MCV: 96.7 fL (ref 78.0–100.0)
Monocytes Absolute: 0.3 10*3/uL (ref 0.1–1.0)
Monocytes Relative: 11 % (ref 3–12)
NEUTROS ABS: 1.7 10*3/uL (ref 1.7–7.7)
NEUTROS PCT: 59 % (ref 43–77)
Platelets: 149 10*3/uL — ABNORMAL LOW (ref 150–400)
RBC: 3.93 MIL/uL (ref 3.87–5.11)
RDW: 12.5 % (ref 11.5–15.5)
WBC: 2.8 10*3/uL — AB (ref 4.0–10.5)

## 2015-03-09 LAB — SEDIMENTATION RATE: Sed Rate: 9 mm/hr (ref 0–22)

## 2015-03-09 LAB — RETICULOCYTES
RBC.: 3.93 MIL/uL (ref 3.87–5.11)
Retic Count, Absolute: 59 10*3/uL (ref 19.0–186.0)
Retic Ct Pct: 1.5 % (ref 0.4–3.1)

## 2015-03-09 LAB — C-REACTIVE PROTEIN: CRP: 0.6 mg/dL (ref <1.0)

## 2015-03-09 LAB — LACTATE DEHYDROGENASE: LDH: 224 U/L — ABNORMAL HIGH (ref 98–192)

## 2015-03-09 NOTE — Patient Instructions (Signed)
[place] at [facility] Discharge Instructions  RECOMMENDATIONS MADE BY THE CONSULTANT AND ANY TEST RESULTS WILL BE SENT TO YOUR REFERRING PHYSICIAN.  Lab work today. We will call you with results. Return as scheduled.  Thank you for choosing [place] at [facility] to provide your oncology and hematology care.  To afford each patient quality time with our provider, please arrive at least 15 minutes before your scheduled appointment time.    You need to re-schedule your appointment should you arrive 10 or more minutes late.  We strive to give you quality time with our providers, and arriving late affects you and other patients whose appointments are after yours.  Also, if you no show three or more times for appointments you may be dismissed from the clinic at the providers discretion.     Again, thank you for choosing [facility].  Our hope is that these requests will decrease the amount of time that you wait before being seen by our physicians.       _____________________________________________________________  Should you have questions after your visit to [facility], please contact our office at [phone] between the hours of 8:30 a.m. and 4:30 p.m.  Voicemails left after 4:30 p.m. will not be returned until the following business day.  For prescription refill requests, have your pharmacy contact our office.

## 2015-03-09 NOTE — Progress Notes (Signed)
Theresa Morrison presented for [employer]. Labs per MD order drawn via Peripheral Line 23 gauge needle inserted in right antecubital.  Good blood return present. Procedure without incident.  Needle removed intact. Patient tolerated procedure well.

## 2015-03-09 NOTE — Progress Notes (Signed)
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 female is here because of hemolytic anemia. The patient is here alone today.  She reports that she recently had a blood test and her alkaline phosphate was increased. She was curious if this could explain her joint pain.  She has had no new medications recently.  She has a new joint issue, apparently since she's been off of the prednisone. Her hands began hurting about a month and a half ago. She is not aware of any family history of rheumatoid arthritis or inflammatory problems  She reports fatigue, noting that she called the clinic about a week ago asking for an early blood check, but she did not receive a call back. I do not see where this is documented in EPIC.  She has had a bone density scan, but thinks it has been several years.  MEDICAL HISTORY:  Past Medical History  Diagnosis Date  . HIV positive 1987  . Colon polyps     adenomatous  . CVA (cerebral vascular accident)   . Shingles   . Toxoplasmosis   . Hypercholesterolemia     SURGICAL HISTORY: Past Surgical History  Procedure Laterality Date  . Tonsillectomy    . Appendectomy  2001  . Knee arthroscopy Bilateral   . Shoulder surgery Right     SOCIAL HISTORY: Social History   Social  History  . Marital Status: Married    Spouse Name: N/A  . Number of Children: 2  . Years of Education: N/A   Occupational History  .     Social History Main Topics  . Smoking status: Never Smoker   . Smokeless tobacco: Never Used  . Alcohol Use: No  . Drug Use: No  . Sexual Activity: Not Currently     Comment: declined condoms   Other Topics Concern  . Not on file   Social History Narrative    FAMILY HISTORY: Family History  Problem Relation Age of Onset  . Heart attack Father   . Breast cancer Mother     mets to lung and brain  . Cancer Mother    indicated that her mother is deceased. She indicated that her father is deceased.   Mother died at 1 from breast cancer Father died at 4 from MI 1 sister, 3 brothers all healthy  ALLERGIES:  is allergic to penicillins; stavudine; and sulfamethoxazole-trimethoprim.  MEDICATIONS:  Current Outpatient Prescriptions  Medication Sig Dispense Refill  . acetaminophen (TYLENOL) 500 MG tablet Take 500 mg by mouth every 6 (six) hours as needed.    Marland Kitchen aspirin 81 MG tablet Take 81 mg by mouth daily.      Marland Kitchen atorvastatin (LIPITOR) 10 MG tablet Take 10 mg by mouth daily.    . beta carotene w/minerals (OCUVITE) tablet Take 2 tablets by mouth daily.    . calcium  carbonate (OS-CAL) 600 MG TABS Take 600 mg by mouth daily.      . clopidogrel (PLAVIX) 75 MG tablet Take 1 tablet (75 mg total) by mouth daily. 90 tablet 0  . furosemide (LASIX) 40 MG tablet Take 40 mg by mouth daily as needed.  1  . NITROSTAT 0.4 MG SL tablet Place 0.4 mg under the tongue every 5 (five) minutes as needed for chest pain.   0  . Omega-3 Fatty Acids (FISH OIL) 1000 MG CAPS Take 1 capsule by mouth daily.     . TRUVADA 200-300 MG per tablet TAKE 1 TABLET DAILY 90 tablet 3  . zolpidem (AMBIEN) 10 MG tablet Take 1/2-1 tablet at bedtime as needed for trouble sleeping 30 tablet 2  . Nevirapine 400 MG TB24 TAKE 1 TABLET DAILY 90 tablet 3  . pantoprazole (PROTONIX) 40 MG  tablet Take 1 tablet (40 mg total) by mouth daily. (Patient not taking: Reported on 01/05/2015) 30 tablet 1  . potassium chloride (K-DUR) 10 MEQ tablet Take 1 tablet (10 mEq total) by mouth 2 (two) times daily. (Patient not taking: Reported on 03/09/2015) 60 tablet 2   No current facility-administered medications for this visit.    Review of Systems  Constitutional: Positive for malaise/fatigue. Negative for fever, chills, weight loss.  HENT: Negative for congestion, hearing loss, nosebleeds, sore throat and tinnitus.   Eyes: Negative for blurred vision, double vision, pain and discharge.  Respiratory: Negative for cough, hemoptysis, sputum production, shortness of breath and wheezing.   Cardiovascular: Negative for chest pain, palpitations, claudication, leg swelling, and PND.  Gastrointestinal: Negative for heartburn, nausea, vomiting, abdominal pain, diarrhea, constipation, blood in stool and melena.  Genitourinary: Negative for dysuria, urgency, frequency and hematuria.  Musculoskeletal: Positive for joint pain. Negative for myalgias and falls. Hand swelling and pain.  Skin: Negative for itching and rash.  Neurological: Negative for dizziness, tingling, tremors, sensory change, speech change, focal weakness, seizures, loss of consciousness, weakness and headaches.  Endo/Heme/Allergies: Does not bruise/bleed easily.  Psychiatric/Behavioral: Negative for depression, suicidal ideas, memory loss and substance abuse. The patient is not nervous/anxious and does not have insomnia.   14 point review of systems was performed and is negative except as detailed under history of present illness and above   PHYSICAL EXAMINATION: ECOG PERFORMANCE STATUS: 1 - Symptomatic but completely ambulatory  Filed Vitals:   03/09/15 1306  BP: 111/61  Pulse: 68  Temp: 98.6 F (37 C)  Resp: 16   Filed Weights   03/09/15 1306  Weight: 168 lb 12.8 oz (76.567 kg)     Physical Exam  Constitutional: She is  oriented to person, place, and time and well-developed, well-nourished, and in no distress.  Lipodystrophy. Flat affect. HENT:  Head: Normocephalic and atraumatic.  Nose: Nose normal.  Mouth/Throat: Oropharynx is clear and moist. No oropharyngeal exudate.  Eyes: Conjunctivae and EOM are normal. Pupils are equal, round, and reactive to light. Right eye exhibits no discharge. Left eye exhibits no discharge. No scleral icterus.  Neck: Normal range of motion. Neck supple. No tracheal deviation present. No thyromegaly present.  Cardiovascular: Normal rate, regular rhythm and normal heart sounds.  Exam reveals no gallop and no friction rub.   No murmur heard. Pulmonary/Chest: Effort normal and breath sounds normal. She has no wheezes. She has no rales.  Abdominal: Soft. Bowel sounds are normal. She exhibits no distension and no mass. There is no tenderness. There is no rebound and no guarding.  Musculoskeletal: Normal range  of motion. She exhibits edema.  Hands swollen, limited ROM of her thumbs. Lymphadenopathy:    She has no cervical adenopathy.  Neurological: She is alert and oriented to person, place, and time. She has normal reflexes. No cranial nerve deficit. Gait normal. Coordination normal.  Skin: Skin is warm and dry. No rash noted.  Psychiatric: Mood, memory, affect and judgment normal.  Nursing note and vitals reviewed.    LABORATORY DATA:  I have reviewed the data as listed Lab Results  Component Value Date   WBC 2.8* 03/09/2015   HGB 13.2 03/09/2015   HCT 38.0 03/09/2015   MCV 96.7 03/09/2015   PLT 149* 03/09/2015     Chemistry      Component Value Date/Time   NA 140 03/09/2015 1400   K 3.7 03/09/2015 1400   CL 107 03/09/2015 1400   CO2 26 03/09/2015 1400   BUN 11 03/09/2015 1400   CREATININE 1.25* 03/09/2015 1400   CREATININE 1.10 07/13/2014 1720      Component Value Date/Time   CALCIUM 8.7* 03/09/2015 1400   ALKPHOS 281* 03/09/2015 1400   AST 24 03/09/2015 1400    ALT 20 03/09/2015 1400   BILITOT 0.5 03/09/2015 1400      RADIOLOGY:  CLINICAL DATA: Left lower extremity swelling  EXAM: LEFT LOWER EXTREMITY VENOUS DOPPLER ULTRASOUND IMPRESSION: No evidence of deep venous thrombosis of the left leg. There is slight soft tissue edema in the left calf.   Electronically Signed  By: Lorriane Shire M.D.  On: 11/03/2014 14:58  ASSESSMENT & PLAN:   Coombs positive hemolytic anemia Steroid responsive HIV Left lower extremity swelling  Her labs were not done prior to her visit. We will go ahead and obtain her studies prior to departure and as several inflammatory markers as well. I advised her we will check a CBC, haptoglobin, LDH every 2 months. I will move her visits out with me to every 4 months.  We will fractionate her alkaline phosphatase and keep her apprised of the results.  In regards to her joint discomfort we will see what her labs look like today and consider rheumatology referral if needed.  All questions were answered. The patient knows to call the clinic with any problems, questions or concerns  This document serves as a record of services personally performed by Ancil Linsey, MD. It was created on her behalf by Toni Amend, a trained medical scribe. The creation of this record is based on the scribe's personal observations and the provider's statements to them. This document has been checked and approved by the attending provider.  I have reviewed the above documentation for accuracy and completeness, and I agree with the above.  This note was electronically signed.   Molli Hazard, MD  04/04/2015 5:54 PM

## 2015-03-10 LAB — ANTINUCLEAR ANTIBODIES, IFA: ANTINUCLEAR ANTIBODIES, IFA: NEGATIVE

## 2015-03-10 LAB — HAPTOGLOBIN: HAPTOGLOBIN: 25 mg/dL — AB (ref 34–200)

## 2015-03-11 ENCOUNTER — Other Ambulatory Visit (HOSPITAL_COMMUNITY): Payer: Self-pay | Admitting: Oncology

## 2015-03-11 DIAGNOSIS — B20 Human immunodeficiency virus [HIV] disease: Secondary | ICD-10-CM

## 2015-03-11 DIAGNOSIS — D589 Hereditary hemolytic anemia, unspecified: Secondary | ICD-10-CM

## 2015-03-15 LAB — ALKALINE PHOSPHATASE ISOENZYMES
ALKALINE PHOSPHATASE (APISO): 272 U/L — AB (ref 33–130)
Bone %: 77 % — ABNORMAL HIGH (ref 28–66)
INTESTINAL %: 0 % — AB (ref 1–24)
LIVER %: 23 % — AB (ref 25–69)
MACROHEPATIC ISOENZYMES: 0 %

## 2015-03-22 ENCOUNTER — Other Ambulatory Visit: Payer: Self-pay | Admitting: Internal Medicine

## 2015-03-22 DIAGNOSIS — B20 Human immunodeficiency virus [HIV] disease: Secondary | ICD-10-CM

## 2015-04-02 ENCOUNTER — Other Ambulatory Visit (HOSPITAL_COMMUNITY): Payer: Self-pay | Admitting: Oncology

## 2015-04-02 DIAGNOSIS — R748 Abnormal levels of other serum enzymes: Secondary | ICD-10-CM

## 2015-04-02 NOTE — Progress Notes (Signed)
Per alk phos lab result note, patient is agreeable with having a bone density completed.  Order placed.  Radiology scheduling to call patient.

## 2015-04-04 ENCOUNTER — Encounter (HOSPITAL_COMMUNITY): Payer: Self-pay | Admitting: Hematology & Oncology

## 2015-04-08 ENCOUNTER — Other Ambulatory Visit: Payer: Federal, State, Local not specified - PPO

## 2015-04-08 DIAGNOSIS — B20 Human immunodeficiency virus [HIV] disease: Secondary | ICD-10-CM

## 2015-04-08 LAB — COMPREHENSIVE METABOLIC PANEL
ALK PHOS: 279 U/L — AB (ref 33–130)
ALT: 16 U/L (ref 6–29)
AST: 19 U/L (ref 10–35)
Albumin: 4.4 g/dL (ref 3.6–5.1)
BUN: 12 mg/dL (ref 7–25)
CALCIUM: 9.4 mg/dL (ref 8.6–10.4)
CHLORIDE: 108 mmol/L (ref 98–110)
CO2: 27 mmol/L (ref 20–31)
Creat: 1.25 mg/dL — ABNORMAL HIGH (ref 0.50–1.05)
GLUCOSE: 77 mg/dL (ref 65–99)
POTASSIUM: 4.2 mmol/L (ref 3.5–5.3)
Sodium: 145 mmol/L (ref 135–146)
Total Bilirubin: 0.5 mg/dL (ref 0.2–1.2)
Total Protein: 6 g/dL — ABNORMAL LOW (ref 6.1–8.1)

## 2015-04-08 LAB — CBC
HEMATOCRIT: 37 % (ref 36.0–46.0)
Hemoglobin: 12.7 g/dL (ref 12.0–15.0)
MCH: 33.4 pg (ref 26.0–34.0)
MCHC: 34.3 g/dL (ref 30.0–36.0)
MCV: 97.4 fL (ref 78.0–100.0)
MPV: 9 fL (ref 8.6–12.4)
Platelets: 142 10*3/uL — ABNORMAL LOW (ref 150–400)
RBC: 3.8 MIL/uL — ABNORMAL LOW (ref 3.87–5.11)
RDW: 13.4 % (ref 11.5–15.5)
WBC: 2.5 10*3/uL — ABNORMAL LOW (ref 4.0–10.5)

## 2015-04-09 LAB — HIV-1 RNA QUANT-NO REFLEX-BLD
HIV 1 RNA Quant: <20 copies/mL (ref <20)
HIV-1 RNA Quant, Log: <1.3 {Log} (ref <1.30)

## 2015-04-12 LAB — T-HELPER CELL (CD4) - (RCID CLINIC ONLY)
CD4 % Helper T Cell: 43 % (ref 33–55)
CD4 T Cell Abs: 340 /uL — ABNORMAL LOW (ref 400–2700)

## 2015-04-13 ENCOUNTER — Ambulatory Visit (HOSPITAL_COMMUNITY)
Admission: RE | Admit: 2015-04-13 | Discharge: 2015-04-13 | Disposition: A | Discharge status: Home / Self Care | Payer: Federal, State, Local not specified - PPO | Source: Ambulatory Visit | Attending: Hematology & Oncology | Admitting: Hematology & Oncology

## 2015-04-13 DIAGNOSIS — Z780 Asymptomatic menopausal state: Secondary | ICD-10-CM | POA: Diagnosis present

## 2015-04-13 DIAGNOSIS — Z79899 Other long term (current) drug therapy: Secondary | ICD-10-CM | POA: Diagnosis present

## 2015-04-13 DIAGNOSIS — R748 Abnormal levels of other serum enzymes: Secondary | ICD-10-CM | POA: Insufficient documentation

## 2015-04-13 DIAGNOSIS — M85862 Other specified disorders of bone density and structure, left lower leg: Secondary | ICD-10-CM | POA: Diagnosis not present

## 2015-04-22 ENCOUNTER — Ambulatory Visit (INDEPENDENT_AMBULATORY_CARE_PROVIDER_SITE_OTHER): Payer: Federal, State, Local not specified - PPO | Admitting: Internal Medicine

## 2015-04-22 ENCOUNTER — Encounter: Payer: Self-pay | Admitting: Internal Medicine

## 2015-04-22 VITALS — BP 101/64 | HR 62 | Temp 98.2°F | Wt 164.8 lb

## 2015-04-22 DIAGNOSIS — B20 Human immunodeficiency virus [HIV] disease: Secondary | ICD-10-CM | POA: Diagnosis not present

## 2015-04-22 DIAGNOSIS — M79643 Pain in unspecified hand: Secondary | ICD-10-CM | POA: Diagnosis not present

## 2015-04-22 DIAGNOSIS — Z23 Encounter for immunization: Secondary | ICD-10-CM | POA: Diagnosis not present

## 2015-04-22 MED ORDER — ABACAVIR-DOLUTEGRAVIR-LAMIVUD 600-50-300 MG PO TABS: 1.0000 | ORAL_TABLET | Freq: Every day | ORAL | Status: DC

## 2015-04-22 NOTE — Progress Notes (Signed)
Patient ID: Theresa Morrison, female   DOB: [birth year], 59 y.o.   MRN: [id number]          Patient Active Problem List   Diagnosis Date Noted  . Hemolytic anemia     Priority: High  . Renal insufficiency 01/09/2012    Priority: High  . Dyslipidemia 05/11/2011    Priority: High  . Human immunodeficiency virus (HIV) disease 10/11/2006    Priority: High  . History of cardiovascular disorder 10/02/2006    Priority: High  . Hand pain 04/22/2015  . 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 Elevated alkaline phosphatase level 01/14/2013  . Hip pain, left 03/08/2011  . ROTATOR CUFF SYNDROME, RIGHT 07/12/2010  . 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 RECTAL BLEEDING 10/11/2006  . SYMPTOM, ABNORMAL LOSS OF WEIGHT 10/11/2006  . 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 LIPODYSTROPHY 10/02/2006  . PERIPHERAL NEUROPATHY 10/02/2006  . ARTHROSCOPY, KNEE, HX OF 10/02/2006    Patient's Medications  New Prescriptions   ABACAVIR-DOLUTEGRAVIR-LAMIVUD 600-50-300 MG TABS    Take 1 tablet by mouth daily.  Previous Medications   ACETAMINOPHEN (TYLENOL) 500 MG TABLET    Take 500 mg by mouth every 6 (six) hours as needed.   ASPIRIN 81 MG TABLET    Take 81 mg by mouth daily.     ATORVASTATIN (LIPITOR) 10 MG TABLET    Take 10 mg by mouth daily.   BETA CAROTENE W/MINERALS (OCUVITE) TABLET    Take 2 tablets by mouth daily.   CALCIUM CARBONATE (OS-CAL) 600 MG TABS    Take 600 mg by mouth daily.     CLOPIDOGREL (PLAVIX) 75 MG TABLET    Take 1 tablet (75 mg total) by mouth daily.   FUROSEMIDE (LASIX) 40 MG TABLET    Take 40 mg by mouth daily as needed.   NITROSTAT 0.4 MG SL TABLET    Place 0.4 mg under the tongue every 5 (five) minutes as needed for chest pain.    OMEGA-3 FATTY ACIDS (FISH OIL) 1000 MG CAPS    Take 1 capsule by mouth daily.    PANTOPRAZOLE (PROTONIX) 40 MG TABLET    Take 1 tablet (40 mg total) by mouth daily.   POTASSIUM CHLORIDE (K-DUR) 10 MEQ TABLET    Take 1 tablet (10  mEq total) by mouth 2 (two) times daily.   ZOLPIDEM (AMBIEN) 10 MG TABLET    Take 1/2-1 tablet at bedtime as needed for trouble sleeping  Modified Medications   No medications on file  Discontinued Medications   NEVIRAPINE 400 MG TB24    TAKE 1 TABLET DAILY   TRUVADA 200-300 MG PER TABLET    TAKE 1 TABLET DAILY    Subjective: Nakiah is in for her routine HIV follow-up visit. As usual she never misses a single dose of her Truvada or Viramune. Recently, she has had problems with pain and stiffness in her hands. She has some popping when she flexes her left thumb. 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 The pain and stiffness occur throughout the day. She's not had any recent injury. She's had no change in her chronic, mild hip pain. No other joints are particularly bothersome to her recently. She has not had any fever, chills or sweats.   Review of Systems: Pertinent items are noted in HPI.  Past Medical History  Diagnosis Date  . HIV positive 1987  . Colon polyps     adenomatous  .  CVA (cerebral vascular accident)   . Shingles   . Toxoplasmosis   . Hypercholesterolemia     Social History  Substance Use Topics  . Smoking status: Never Smoker   . Smokeless tobacco: Never Used  . Alcohol Use: No    Family History  Problem Relation Age of Onset  . Heart attack Father   . Breast cancer Mother     mets to lung and brain  . Cancer Mother     Allergies  Allergen Reactions  . Penicillins     REACTION: facial swelling  . Stavudine     REACTION: peripheral neuropathy  . Sulfamethoxazole-Trimethoprim     REACTION: hives    Objective:  Filed Vitals:   04/22/15 1016  BP: 101/64  Pulse: 62  Temp: 98.2 F (36.8 C)  TempSrc: Oral  Weight: 164 lb 12 oz (74.73 kg)   Body mass index is 28.06 kg/(m^2).  General: She is in no distress Oral: No oropharyngeal lesions Skin: No rash Lungs: Clear Cor: Regular S1 and S2 with no murmurs Joints and  extremities: Diffuse swelling of her hands and fingers. She has difficulty making a fist with both hands due to pain and stiffness. Trigger finger noted when flexing her left thumb Mood: Her affect is slightly flat as usual but she does smile and joke and she does not appear anxious or depressed  Lab Results Lab Results  Component Value Date   WBC 2.5* 04/08/2015   HGB 12.7 04/08/2015   HCT 37.0 04/08/2015   MCV 97.4 04/08/2015   PLT 142* 04/08/2015    Lab Results  Component Value Date   CREATININE 1.25* 04/08/2015   BUN 12 04/08/2015   NA 145 04/08/2015   K 4.2 04/08/2015   CL 108 04/08/2015   CO2 27 04/08/2015    Lab Results  Component Value Date   ALT 16 04/08/2015   AST 19 04/08/2015   ALKPHOS 279* 04/08/2015   BILITOT 0.5 04/08/2015    Lab Results  Component Value Date   CHOL 179 10/01/2014   HDL 72 10/01/2014   LDLCALC 92 10/01/2014   TRIG 73 10/01/2014   CHOLHDL 2.5 10/01/2014    Lab Results HIV 1 RNA QUANT (copies/mL)  Date Value  04/08/2015 <20  10/01/2014 <20  07/13/2014 <20   CD4 T CELL ABS (/uL)  Date Value  04/08/2015 340*  10/01/2014 470  07/31/2014 60*     Problem List Items Addressed This Visit      High   Human immunodeficiency virus (HIV) disease    Her HIV infection remains under excellent control. I talked to her about newer, safer and more convenient anti-retroviral regimens. Her HLA [id number] is negative indicating that she should tolerate abacavir. I will switch her to Triumeq. This will be only 1 pill daily and will help avoid further problems with renal insufficiency and osteoporosis. She will follow-up with me after blood work in 6 months      Relevant Medications   Abacavir-Dolutegravir-Lamivud 600-50-300 MG TABS   Other Relevant Orders   T-helper cell (CD4)- ([facility] clinic only)   HIV 1 RNA quant-no reflex-bld   CBC   Comprehensive metabolic panel   Lipid panel   RPR     Unprioritized   Hand pain - Primary    I'm not sure  what is causing her diffuse hand pain and stiffness. She has no unusual family history of arthritis. Her recent sedimentation rate and C-reactive protein were normal. Suggested she follow-up  with her primary care physician, Dr. Hilma Favors.           Michel Bickers, MD [facility] [phone] pager   [phone] cell 04/22/2015, 11:05 AM

## 2015-04-22 NOTE — Assessment & Plan Note (Signed)
I'm not sure what is causing her diffuse hand pain and stiffness. She has no unusual family history of arthritis. Her recent sedimentation rate and C-reactive protein were normal. Suggested she follow-up with her primary care physician, Dr. Hilma Favors.

## 2015-04-22 NOTE — Assessment & Plan Note (Signed)
Her HIV infection remains under excellent control. I talked to her about newer, safer and more convenient anti-retroviral regimens. Her HLA [id number] is negative indicating that she should tolerate abacavir. I will switch her to Triumeq. This will be only 1 pill daily and will help avoid further problems with renal insufficiency and osteoporosis. She will follow-up with me after blood work in 6 months

## 2015-05-10 ENCOUNTER — Encounter (HOSPITAL_COMMUNITY): Payer: Federal, State, Local not specified - PPO | Attending: Hematology & Oncology

## 2015-05-10 DIAGNOSIS — D589 Hereditary hemolytic anemia, unspecified: Secondary | ICD-10-CM

## 2015-05-10 DIAGNOSIS — B20 Human immunodeficiency virus [HIV] disease: Secondary | ICD-10-CM

## 2015-05-10 DIAGNOSIS — D598 Other acquired hemolytic anemias: Secondary | ICD-10-CM | POA: Diagnosis not present

## 2015-05-10 LAB — CBC WITH DIFFERENTIAL/PLATELET
Basophils Absolute: 0 10*3/uL (ref 0.0–0.1)
Basophils Relative: 0 %
Eosinophils Absolute: 0.1 10*3/uL (ref 0.0–0.7)
Eosinophils Relative: 6 %
HCT: 36.6 % (ref 36.0–46.0)
Hemoglobin: 13.2 g/dL (ref 12.0–15.0)
Lymphocytes Relative: 30 %
Lymphs Abs: 0.7 10*3/uL (ref 0.7–4.0)
MCH: 35 pg — ABNORMAL HIGH (ref 26.0–34.0)
MCHC: 36.1 g/dL — ABNORMAL HIGH (ref 30.0–36.0)
MCV: 97.1 fL (ref 78.0–100.0)
Monocytes Absolute: 0.3 10*3/uL (ref 0.1–1.0)
Monocytes Relative: 11 %
Neutro Abs: 1.3 10*3/uL — ABNORMAL LOW (ref 1.7–7.7)
Neutrophils Relative %: 53 %
Platelets: 141 10*3/uL — ABNORMAL LOW (ref 150–400)
RBC: 3.77 MIL/uL — ABNORMAL LOW (ref 3.87–5.11)
RDW: 12.2 % (ref 11.5–15.5)
WBC: 2.4 10*3/uL — ABNORMAL LOW (ref 4.0–10.5)

## 2015-05-10 LAB — LACTATE DEHYDROGENASE: LDH: 225 U/L — ABNORMAL HIGH (ref 98–192)

## 2015-05-10 NOTE — Progress Notes (Signed)
Labs drawn

## 2015-05-11 ENCOUNTER — Other Ambulatory Visit (HOSPITAL_COMMUNITY): Payer: Federal, State, Local not specified - PPO

## 2015-05-11 LAB — HAPTOGLOBIN: Haptoglobin: 23 mg/dL — ABNORMAL LOW (ref 34–200)

## 2015-05-25 ENCOUNTER — Other Ambulatory Visit: Payer: Self-pay | Admitting: *Deleted

## 2015-05-25 DIAGNOSIS — B20 Human immunodeficiency virus [HIV] disease: Secondary | ICD-10-CM

## 2015-05-25 MED ORDER — ABACAVIR-DOLUTEGRAVIR-LAMIVUD 600-50-300 MG PO TABS: 1.0000 | ORAL_TABLET | Freq: Every day | ORAL | Status: DC

## 2015-05-25 NOTE — Telephone Encounter (Signed)
Needing 90-day refill through mail order.  Needing co-pay card for Triumeq.  RN will mail the co-pay card to the pt.  If she has problems with the co-pay card she will call back.

## 2015-06-08 ENCOUNTER — Other Ambulatory Visit: Payer: Self-pay | Admitting: *Deleted

## 2015-06-08 DIAGNOSIS — B20 Human immunodeficiency virus [HIV] disease: Secondary | ICD-10-CM

## 2015-06-08 MED ORDER — ABACAVIR-DOLUTEGRAVIR-LAMIVUD 600-50-300 MG PO TABS: 1.0000 | ORAL_TABLET | Freq: Every day | ORAL | Status: DC

## 2015-07-12 ENCOUNTER — Encounter (HOSPITAL_COMMUNITY): Payer: Self-pay | Admitting: Hematology & Oncology

## 2015-07-12 ENCOUNTER — Encounter (HOSPITAL_COMMUNITY): Payer: Federal, State, Local not specified - PPO | Attending: Hematology & Oncology | Admitting: Hematology & Oncology

## 2015-07-12 ENCOUNTER — Encounter (HOSPITAL_BASED_OUTPATIENT_CLINIC_OR_DEPARTMENT_OTHER): Payer: Federal, State, Local not specified - PPO

## 2015-07-12 VITALS — BP 98/61 | HR 58 | Temp 98.3°F | Resp 15 | Wt 163.4 lb

## 2015-07-12 DIAGNOSIS — D598 Other acquired hemolytic anemias: Secondary | ICD-10-CM | POA: Insufficient documentation

## 2015-07-12 DIAGNOSIS — D589 Hereditary hemolytic anemia, unspecified: Secondary | ICD-10-CM

## 2015-07-12 DIAGNOSIS — M7989 Other specified soft tissue disorders: Secondary | ICD-10-CM | POA: Diagnosis not present

## 2015-07-12 DIAGNOSIS — D599 Acquired hemolytic anemia, unspecified: Secondary | ICD-10-CM | POA: Diagnosis not present

## 2015-07-12 DIAGNOSIS — R799 Abnormal finding of blood chemistry, unspecified: Secondary | ICD-10-CM

## 2015-07-12 DIAGNOSIS — B20 Human immunodeficiency virus [HIV] disease: Secondary | ICD-10-CM

## 2015-07-12 DIAGNOSIS — D5919 Other autoimmune hemolytic anemia: Secondary | ICD-10-CM

## 2015-07-12 DIAGNOSIS — D591 Other autoimmune hemolytic anemias: Secondary | ICD-10-CM

## 2015-07-12 LAB — CBC WITH DIFFERENTIAL/PLATELET
BASOS ABS: 0 10*3/uL (ref 0.0–0.1)
BASOS PCT: 0 %
EOS ABS: 0.1 10*3/uL (ref 0.0–0.7)
EOS PCT: 3 %
HEMATOCRIT: 33 % — AB (ref 36.0–46.0)
Hemoglobin: 11.5 g/dL — ABNORMAL LOW (ref 12.0–15.0)
Lymphocytes Relative: 25 %
Lymphs Abs: 0.6 10*3/uL — ABNORMAL LOW (ref 0.7–4.0)
MCH: 34.7 pg — ABNORMAL HIGH (ref 26.0–34.0)
MCHC: 34.8 g/dL (ref 30.0–36.0)
MCV: 99.7 fL (ref 78.0–100.0)
MONO ABS: 0.3 10*3/uL (ref 0.1–1.0)
MONOS PCT: 13 %
Neutro Abs: 1.4 10*3/uL — ABNORMAL LOW (ref 1.7–7.7)
Neutrophils Relative %: 59 %
Platelets: 136 10*3/uL — ABNORMAL LOW (ref 150–400)
RBC: 3.31 MIL/uL — ABNORMAL LOW (ref 3.87–5.11)
RDW: 13 % (ref 11.5–15.5)
WBC: 2.4 10*3/uL — ABNORMAL LOW (ref 4.0–10.5)

## 2015-07-12 LAB — COMPREHENSIVE METABOLIC PANEL
ALBUMIN: 4.3 g/dL (ref 3.5–5.0)
ALT: 22 U/L (ref 14–54)
ANION GAP: 6 (ref 5–15)
AST: 23 U/L (ref 15–41)
Alkaline Phosphatase: 201 U/L — ABNORMAL HIGH (ref 38–126)
BILIRUBIN TOTAL: 0.6 mg/dL (ref 0.3–1.2)
BUN: 14 mg/dL (ref 6–20)
CHLORIDE: 109 mmol/L (ref 101–111)
CO2: 27 mmol/L (ref 22–32)
Calcium: 9.4 mg/dL (ref 8.9–10.3)
Creatinine, Ser: 1.25 mg/dL — ABNORMAL HIGH (ref 0.44–1.00)
GFR calc Af Amer: 54 mL/min — ABNORMAL LOW (ref >60)
GFR calc non Af Amer: 46 mL/min — ABNORMAL LOW (ref >60)
GLUCOSE: 84 mg/dL (ref 65–99)
POTASSIUM: 4 mmol/L (ref 3.5–5.1)
SODIUM: 142 mmol/L (ref 135–145)
TOTAL PROTEIN: 6.4 g/dL — AB (ref 6.5–8.1)

## 2015-07-12 LAB — LACTATE DEHYDROGENASE: LDH: 201 U/L — AB (ref 98–192)

## 2015-07-12 NOTE — Progress Notes (Signed)
..  Theresa Morrison's reason for visit today are for labs as scheduled per MD orders.  Venipuncture performed with a 23 gauge butterfly needle to [place] tolerated venipuncture well and without incident; questions were answered and patient was discharged.

## 2015-07-12 NOTE — Progress Notes (Signed)
[facility] Progress Note Patient Care Team: Sharilyn Sites, MD as PCP - General (Family Medicine) Patrici Ranks, MD as Consulting Physician (Hematology and Oncology)  CHIEF COMPLAINTS/PURPOSE OF CONSULTATION:  Hemolytic anemia, coombs positive,  Admitted to [facility] on 07/13/2014 with Hb 3.2, reticulocyte percent at 23%, LDH 719, T bili 2.9, Haptoglobin < 25 and Coombs positive for IgG and complement  Prednisone, now discontinued  HIV positivity: on nevirapine and truvada with excellent long-term control--followed by Dr Megan Salon CD4 count of 60 on 07/31/14, prior of 470 07/14/2015  Thrombocytopenia noted on CBC 07/31/14 at 123K, down to [place] on 08/07/14  HISTORY OF PRESENTING ILLNESS:  Theresa Morrison 59 y.o. female is here for follow-up of a history of hemolytic anemia. She is currently in remission.  The patient is here alone today.  [name] returns to the [place] alone today.  She continues to struggle with joint pain. Dr. Hilma Favors said her hands are "overworked." She remarks "since I got pregnant, my fingers have been fat."  Her thumbs are the only parts of her hands that now bother her consistently. She does not feel the need to see a rheumatologist.  She says that everything is going well. She's started on a new HIV medication.  She confirms sleeping and eating okay, and having good energy levels. She denies any problems with her bowels and confirms that her breathing has been good.  [name] believes that her last mammogram was in October.  She has had her flu shot, and in general has few concerns.   MEDICAL HISTORY:  Past Medical History  Diagnosis Date  . HIV positive ([place]) 1987  . Colon polyps     adenomatous  . CVA (cerebral vascular accident) ([place])   . Shingles   . Toxoplasmosis   . Hypercholesterolemia     SURGICAL HISTORY: Past Surgical History  Procedure Laterality Date  . Tonsillectomy    . Appendectomy  2001  . Knee  arthroscopy Bilateral   . Shoulder surgery Right     SOCIAL HISTORY: Social History   Social History  . Marital Status: Married    Spouse Name: N/A  . Number of Children: 2  . Years of Education: N/A   Occupational History  .     Social History Main Topics  . Smoking status: Never Smoker   . Smokeless tobacco: Never Used  . Alcohol Use: No  . Drug Use: No  . Sexual Activity: Not Currently     Comment: declined condoms   Other Topics Concern  . Not on file   Social History Narrative    FAMILY HISTORY: Family History  Problem Relation Age of Onset  . Heart attack Father   . Breast cancer Mother     mets to lung and brain  . Cancer Mother    indicated that her mother is deceased. She indicated that her father is deceased.   Mother died at 44 from breast cancer Father died at 20 from MI 1 sister, 3 brothers all healthy  ALLERGIES:  is allergic to penicillins; stavudine; and sulfamethoxazole-trimethoprim.  MEDICATIONS:  Current Outpatient Prescriptions  Medication Sig Dispense Refill  . Abacavir-Dolutegravir-Lamivud 600-50-300 MG TABS Take 1 tablet by mouth daily. 90 tablet 3  . acetaminophen (TYLENOL) 500 MG tablet Take 500 mg by mouth every 6 (six) hours as needed.    Marland Kitchen aspirin 81 MG tablet Take 81 mg by mouth daily.      Marland Kitchen atorvastatin (LIPITOR) 10  MG tablet Take 10 mg by mouth daily.    . beta carotene w/minerals (OCUVITE) tablet Take 2 tablets by mouth daily.    . calcium carbonate (OS-CAL) 600 MG TABS Take 600 mg by mouth daily.      . clopidogrel (PLAVIX) 75 MG tablet Take 1 tablet (75 mg total) by mouth daily. 90 tablet 0  . furosemide (LASIX) 40 MG tablet Take 40 mg by mouth daily as needed.  1  . NITROSTAT 0.4 MG SL tablet Place 0.4 mg under the tongue every 5 (five) minutes as needed for chest pain.   0  . Omega-3 Fatty Acids (FISH OIL) 1000 MG CAPS Take 1 capsule by mouth daily.     . pantoprazole (PROTONIX) 40 MG tablet Take 1 tablet (40 mg total) by  mouth daily. (Patient not taking: Reported on 01/05/2015) 30 tablet 1  . potassium chloride (K-DUR) 10 MEQ tablet Take 1 tablet (10 mEq total) by mouth 2 (two) times daily. (Patient not taking: Reported on 03/09/2015) 60 tablet 2  . zolpidem (AMBIEN) 10 MG tablet Take 1/2-1 tablet at bedtime as needed for trouble sleeping (Patient not taking: Reported on 07/12/2015) 30 tablet 2   No current facility-administered medications for this visit.    Review of Systems  Constitutional: Positive for malaise/fatigue. Negative for fever, chills, weight loss.  HENT: Negative for congestion, hearing loss, nosebleeds, sore throat and tinnitus.   Eyes: Negative for blurred vision, double vision, pain and discharge.  Respiratory: Negative for cough, hemoptysis, sputum production, shortness of breath and wheezing.   Cardiovascular: Negative for chest pain, palpitations, claudication, leg swelling, and PND.  Gastrointestinal: Negative for heartburn, nausea, vomiting, abdominal pain, diarrhea, constipation, blood in stool and melena.  Genitourinary: Negative for dysuria, urgency, frequency and hematuria.  Musculoskeletal: Positive for joint pain. Negative for myalgias and falls. Hand swelling and pain.  Skin: Negative for itching and rash.  Neurological: Negative for dizziness, tingling, tremors, sensory change, speech change, focal weakness, seizures, loss of consciousness, weakness and headaches.  Endo/Heme/Allergies: Does not bruise/bleed easily.  Psychiatric/Behavioral: Negative for depression, suicidal ideas, memory loss and substance abuse. The patient is not nervous/anxious and does not have insomnia.   14 point review of systems was performed and is negative except as detailed under history of present illness and above   PHYSICAL EXAMINATION: ECOG PERFORMANCE STATUS: 1 - Symptomatic but completely ambulatory  Filed Vitals:   07/12/15 1200  BP: 98/61  Pulse: 58  Temp: 98.3 F (36.8 C)  Resp: 15    Filed Weights   07/12/15 1200  Weight: 163 lb 6.4 oz (74.118 kg)     Physical Exam  Constitutional: She is oriented to person, place, and time and well-developed, well-nourished, and in no distress.  Lipodystrophy.  HENT:  Head: Normocephalic and atraumatic.  Nose: Nose normal.  Mouth/Throat: Oropharynx is clear and moist. No oropharyngeal exudate.  Eyes: Conjunctivae and EOM are normal. Pupils are equal, round, and reactive to light. Right eye exhibits no discharge. Left eye exhibits no discharge. No scleral icterus.  Neck: Normal range of motion. Neck supple. No tracheal deviation present. No thyromegaly present.  Cardiovascular: Normal rate, regular rhythm and normal heart sounds.  Exam reveals no gallop and no friction rub.   No murmur heard. Pulmonary/Chest: Effort normal and breath sounds normal. She has no wheezes. She has no rales.  Abdominal: Soft. Bowel sounds are normal. She exhibits no distension and no mass. There is no tenderness. There is no rebound and  no guarding.  Musculoskeletal: Normal range of motion. She exhibits edema.  Hands swollen, limited ROM of her thumbs. Lymphadenopathy:    She has no cervical adenopathy.  Neurological: She is alert and oriented to person, place, and time. She has normal reflexes. No cranial nerve deficit. Gait normal. Coordination normal.  Skin: Skin is warm and dry. No rash noted.  Psychiatric: Mood, memory, affect and judgment normal.  Nursing note and vitals reviewed.   LABORATORY DATA:  I have reviewed the data as listed  Results for RHEA, KAELIN (MRN [id number])   Ref. Range 07/12/2015 12:20  Sodium Latest Ref Range: 135-145 mmol/L 142  Potassium Latest Ref Range: 3.5-5.1 mmol/L 4.0  Chloride Latest Ref Range: 101-111 mmol/L 109  CO2 Latest Ref Range: 22-32 mmol/L 27  BUN Latest Ref Range: 6-20 mg/dL 14  Creatinine Latest Ref Range: 0.44-1.00 mg/dL 1.25 (H)  Calcium Latest Ref Range: 8.9-10.3 mg/dL 9.4  EGFR  (Non-African Amer.) Latest Ref Range: >60 mL/min 46 (L)  EGFR (African American) Latest Ref Range: >60 mL/min 54 (L)  Glucose Latest Ref Range: 65-99 mg/dL 84  Anion gap Latest Ref Range: 5-15  6  Alkaline Phosphatase Latest Ref Range: 38-126 U/L 201 (H)  Albumin Latest Ref Range: 3.5-5.0 g/dL 4.3  AST Latest Ref Range: 15-41 U/L 23  ALT Latest Ref Range: 14-54 U/L 22  Total Protein Latest Ref Range: 6.5-8.1 g/dL 6.4 (L)  Total Bilirubin Latest Ref Range: 0.3-1.2 mg/dL 0.6  LDH Latest Ref Range: 98-192 U/L 201 (H)  WBC Latest Ref Range: 4.0-10.5 K/uL 2.4 (L)  RBC Latest Ref Range: 3.87-5.11 MIL/uL 3.31 (L)  Hemoglobin Latest Ref Range: 12.0-15.0 g/dL 11.5 (L)  HCT Latest Ref Range: 36.0-46.0 % 33.0 (L)  MCV Latest Ref Range: 78.0-100.0 fL 99.7  MCH Latest Ref Range: 26.0-34.0 pg 34.7 (H)  MCHC Latest Ref Range: 30.0-36.0 g/dL 34.8  RDW Latest Ref Range: 11.5-15.5 % 13.0  Platelets Latest Ref Range: 150-400 K/uL 136 (L)  Neutrophils Latest Units: % 59  Lymphocytes Latest Units: % 25  Monocytes Relative Latest Units: % 13  Eosinophil Latest Units: % 3  Basophil Latest Units: % 0  NEUT# Latest Ref Range: 1.7-7.7 K/uL 1.4 (L)  Lymphocyte # Latest Ref Range: 0.7-4.0 K/uL 0.6 (L)  Monocyte # Latest Ref Range: 0.1-1.0 K/uL 0.3  Eosinophils Absolute Latest Ref Range: 0.0-0.7 K/uL 0.1  Basophils Absolute Latest Ref Range: 0.0-0.1 K/uL 0.0  Haptoglobin Latest Ref Range: 34-200 mg/dL <10 (L)    RADIOLOGY:  CLINICAL DATA: Left lower extremity swelling  EXAM: LEFT LOWER EXTREMITY VENOUS DOPPLER ULTRASOUND IMPRESSION: No evidence of deep venous thrombosis of the left leg. There is slight soft tissue edema in the left calf.   Electronically Signed  By: Lorriane Shire M.D.  On: 11/03/2014 14:58  ASSESSMENT & PLAN:   Coombs positive hemolytic anemia, remission Steroid responsive HIV Left lower extremity swelling Persistently low haptoglobin  Bloos counts are excellent.  Haptoglobin remains persistently low yet LDH is WNL. She may have a low grade hemolysis but would continue with observation, nothing needs to be treated currently. She does not have known liver disease.    I have strongly encouraged her to come in for lab work if she develops significant fatigue or concerns that her hemolysis has recurred.   We will leave her on 4 months follow-up.  Orders Placed This Encounter  Procedures  . CBC with Differential    Standing Status: Standing     Number of Occurrences: 4  Standing Expiration Date: 07/11/2017  . Comprehensive metabolic panel    Standing Status: Standing     Number of Occurrences: 4     Standing Expiration Date: 07/11/2017  . Lactate dehydrogenase    Standing Status: Standing     Number of Occurrences: 4     Standing Expiration Date: 07/11/2017  . Haptoglobin    Standing Status: Standing     Number of Occurrences: 4     Standing Expiration Date: 07/11/2017    All questions were answered. The patient knows to call the clinic with any problems, questions or concerns  This document serves as a record of services personally performed by Ancil Linsey, MD. It was created on her behalf by Toni Amend, a trained medical scribe. The creation of this record is based on the scribe's personal observations and the provider's statements to them. This document has been checked and approved by the attending provider.  I have reviewed the above documentation for accuracy and completeness, and I agree with the above.  This note was electronically signed.   Molli Hazard, MD  07/12/2015 1:26 PM

## 2015-07-12 NOTE — Patient Instructions (Addendum)
[place] at [facility] Discharge Instructions  RECOMMENDATIONS MADE BY THE CONSULTANT AND ANY TEST RESULTS WILL BE SENT TO YOUR REFERRING PHYSICIAN.   Exam completed by Dr Whitney Muse today We will call if there is any problem with blood work If you have any problems or concerns we can check your blood work Lab work at 2 and 4 months Return to see the doctor in 4 months Please call the clinic if you have any questions or concerns    Thank you for choosing [place] at [facility] to provide your oncology and hematology care.  To afford each patient quality time with our provider, please arrive at least 15 minutes before your scheduled appointment time.    You need to re-schedule your appointment should you arrive 10 or more minutes late.  We strive to give you quality time with our providers, and arriving late affects you and other patients whose appointments are after yours.  Also, if you no show three or more times for appointments you may be dismissed from the clinic at the providers discretion.     Again, thank you for choosing [facility].  Our hope is that these requests will decrease the amount of time that you wait before being seen by our physicians.       _____________________________________________________________  Should you have questions after your visit to [facility], please contact our office at [phone] between the hours of 8:30 a.m. and 4:30 p.m.  Voicemails left after 4:30 p.m. will not be returned until the following business day.  For prescription refill requests, have your pharmacy contact our office.

## 2015-07-13 LAB — HAPTOGLOBIN: Haptoglobin: <10 mg/dL — ABNORMAL LOW (ref 34–200)

## 2015-08-02 ENCOUNTER — Ambulatory Visit (INDEPENDENT_AMBULATORY_CARE_PROVIDER_SITE_OTHER): Payer: Federal, State, Local not specified - PPO | Admitting: Internal Medicine

## 2015-08-02 ENCOUNTER — Encounter: Payer: Self-pay | Admitting: Internal Medicine

## 2015-08-02 VITALS — BP 90/60 | HR 72 | Ht 64.25 in | Wt 166.4 lb

## 2015-08-02 DIAGNOSIS — Z8673 Personal history of transient ischemic attack (TIA), and cerebral infarction without residual deficits: Secondary | ICD-10-CM | POA: Diagnosis not present

## 2015-08-02 DIAGNOSIS — Z7902 Long term (current) use of antithrombotics/antiplatelets: Secondary | ICD-10-CM

## 2015-08-02 DIAGNOSIS — R748 Abnormal levels of other serum enzymes: Secondary | ICD-10-CM | POA: Diagnosis not present

## 2015-08-02 DIAGNOSIS — Z86010 Personal history of colonic polyps: Secondary | ICD-10-CM

## 2015-08-02 DIAGNOSIS — Z5181 Encounter for therapeutic drug level monitoring: Secondary | ICD-10-CM | POA: Diagnosis not present

## 2015-08-02 MED ORDER — NA SULFATE-K SULFATE-MG SULF 17.5-3.13-1.6 GM/177ML PO SOLN: 1.0000 | Freq: Once | ORAL | Status: DC

## 2015-08-02 NOTE — Progress Notes (Signed)
HISTORY OF PRESENT ILLNESS:  Theresa Morrison is a 60 y.o. female with multiple significant medical problems including HIV disease for which she is under treatment, lipodystrophy, peripheral neuropathy, and prior CVA for which she has been maintained on chronic Plavix and aspirin therapy. She was last seen in this office in April 2014 regarding elevated alkaline phosphatase. Fractionation did not suggest liver origin. This was supported by normal GGT and 5 prime nucleotidase. AMA was ordered but not obtained. Not felt critical after aforementioned results returned normal. In any event, she presents today regarding surveillance colonoscopy. She last underwent complete colonoscopy in December 2011. She was found to have diminutive adenomas. Routine follow-up in 5 years recommended. It was elected to perform that examination on aspirin and Plavix with understood limitations and risks. Since she was last seen she does report a history of autoimmune hemolytic anemia which was treated successfully with steroids. She continues to see Dr. Hilma Favors for her general medical care. GI review of systems is negative  REVIEW OF SYSTEMS:  All non-GI ROS negative upon comprehensive review  Past Medical History  Diagnosis Date  . HIV positive ([place]) 1987  . Colon polyps     adenomatous  . CVA (cerebral vascular accident) ([place])   . Shingles   . Toxoplasmosis   . Hypercholesterolemia   . Hyperlipidemia   . Internal hemorrhoids   . Anemia     Past Surgical History  Procedure Laterality Date  . Tonsillectomy    . Appendectomy  2001  . Knee arthroscopy Bilateral   . Shoulder surgery Right     Social History Theresa Morrison  reports that she has never smoked. She has never used smokeless tobacco. She reports that she does not drink alcohol or use illicit drugs.  family history includes Breast cancer in her mother; Cancer in her mother; Heart attack in her father.  Allergies  Allergen Reactions  .  Penicillins     REACTION: facial swelling  . Stavudine     REACTION: peripheral neuropathy  . Sulfamethoxazole-Trimethoprim     REACTION: hives       PHYSICAL EXAMINATION: Vital signs: BP 90/60 mmHg  Pulse 72  Ht 5' 4.25" (1.632 m)  Wt 166 lb 6.4 oz (75.479 kg)  BMI 28.34 kg/m2  Constitutional: generally well-appearing, no acute distress Psychiatric: alert and oriented x3, cooperative Eyes: extraocular movements intact, anicteric, conjunctiva pink Mouth: oral pharynx moist, no lesions Neck: supple no lymphadenopathy Cardiovascular: heart regular rate and rhythm, no murmur Lungs: clear to auscultation bilaterally Abdomen: soft, nontender, nondistended, no obvious ascites, no peritoneal signs, normal bowel sounds, no organomegaly Rectal: Deferred until colonoscopy Extremities: no clubbing cyanosis or lower extremity edema bilaterally Skin: no lesions on visible extremities Neuro: No focal deficits. No asterixis.    ASSESSMENT:  #1. History of adenomatous colon polyps. Last colonoscopy 2011. Due for surveillance #2. Multiple medical problems including history of CVA for which she is on aspirin and Plavix #3. History of elevated alkaline phosphatase of non-liver origin  PLAN:  #1. Colonoscopy. Patient is high-risk given her comorbidities and medical regimen. We again discussed the pros and cons of performing colonoscopy on Plavix and off Plavix. We will proceed with her remaining on medication. Limitations again reviewed. She except these.The nature of the procedure, as well as the risks, benefits, and alternatives were carefully and thoroughly reviewed with the patient. Ample time for discussion and questions allowed. The patient understood, was satisfied, and agreed to proceed.

## 2015-08-02 NOTE — Patient Instructions (Signed)

## 2015-08-04 ENCOUNTER — Other Ambulatory Visit: Payer: Self-pay | Admitting: Internal Medicine

## 2015-08-09 ENCOUNTER — Other Ambulatory Visit: Payer: Self-pay | Admitting: *Deleted

## 2015-08-09 MED ORDER — CLOPIDOGREL BISULFATE 75 MG PO TABS: 75.0000 mg | ORAL_TABLET | Freq: Every day | ORAL | Status: DC

## 2015-08-19 ENCOUNTER — Ambulatory Visit (AMBULATORY_SURGERY_CENTER): Payer: Federal, State, Local not specified - PPO | Admitting: Internal Medicine

## 2015-08-19 ENCOUNTER — Encounter: Payer: Self-pay | Admitting: Internal Medicine

## 2015-08-19 VITALS — BP 98/59 | HR 61 | Temp 97.5°F | Resp 16 | Ht 64.25 in | Wt 166.0 lb

## 2015-08-19 DIAGNOSIS — Z86010 Personal history of colonic polyps: Secondary | ICD-10-CM

## 2015-08-19 DIAGNOSIS — D122 Benign neoplasm of ascending colon: Secondary | ICD-10-CM | POA: Diagnosis not present

## 2015-08-19 DIAGNOSIS — D120 Benign neoplasm of cecum: Secondary | ICD-10-CM

## 2015-08-19 MED ORDER — SODIUM CHLORIDE 0.9 % IV SOLN: 500.0000 mL | INTRAVENOUS | Status: DC

## 2015-08-19 NOTE — Progress Notes (Signed)
Called to room to assist during endoscopic procedure.  Patient ID and intended procedure confirmed with present staff. Received instructions for my participation in the procedure from the performing physician.  

## 2015-08-19 NOTE — Progress Notes (Signed)
A/ox3 pleased with MAC, report to Jane RN 

## 2015-08-19 NOTE — Patient Instructions (Signed)
FOLLOW DISCHARGE INSTRUCTIONS (BLUE AND GREEN SHEETS).YOU HAD AN ENDOSCOPIC PROCEDURE TODAY AT [facility]:   Refer to the procedure report that was given to you for any specific questions about what was found during the examination.  If the procedure report does not answer your questions, please call your gastroenterologist to clarify.  If you requested that your care partner not be given the details of your procedure findings, then the procedure report has been included in a sealed envelope for you to review at your convenience later.  YOU SHOULD EXPECT: Some feelings of bloating in the abdomen. Passage of more gas than usual.  Walking can help get rid of the air that was put into your GI tract during the procedure and reduce the bloating. If you had a lower endoscopy (such as a colonoscopy or flexible sigmoidoscopy) you may notice spotting of blood in your stool or on the toilet paper. If you underwent a bowel prep for your procedure, you may not have a normal bowel movement for a few days.  Please Note:  You might notice some irritation and congestion in your nose or some drainage.  This is from the oxygen used during your procedure.  There is no need for concern and it should clear up in a day or so.  SYMPTOMS TO REPORT IMMEDIATELY:   Following lower endoscopy (colonoscopy or flexible sigmoidoscopy):  Excessive amounts of blood in the stool  Significant tenderness or worsening of abdominal pains  Swelling of the abdomen that is new, acute  Fever of 100F or higher  For urgent or emergent issues, a gastroenterologist can be reached at any hour by calling [phone].   DIET: Your first meal following the procedure should be a small meal and then it is ok to progress to your normal diet. Heavy or fried foods are harder to digest and may make you feel nauseous or bloated.  Likewise, meals heavy in dairy and vegetables can increase bloating.  Drink plenty of fluids but you  should avoid alcoholic beverages for 24 hours.  ACTIVITY:  You should plan to take it easy for the rest of today and you should NOT DRIVE or use heavy machinery until tomorrow (because of the sedation medicines used during the test).    FOLLOW UP: Our staff will call the number listed on your records the next business day following your procedure to check on you and address any questions or concerns that you may have regarding the information given to you following your procedure. If we do not reach you, we will leave a message.  However, if you are feeling well and you are not experiencing any problems, there is no need to return our call.  We will assume that you have returned to your regular daily activities without incident.  If any biopsies were taken you will be contacted by phone or by letter within the next 1-3 weeks.  Please call us at [phone] if you have not heard about the biopsies in 3 weeks.    SIGNATURES/CONFIDENTIALITY: You and/or your care partner have signed paperwork which will be entered into your electronic medical record.  These signatures attest to the fact that that the information above on your After Visit Summary has been reviewed and is understood.  Full responsibility of the confidentiality of this discharge information lies with you and/or your care-partner.  Polyp information given.  Resume Plavix and aspirin and all other medications.

## 2015-08-19 NOTE — Progress Notes (Signed)
PT. Fingers are swollen bilateral, pt. Stated that when she had her child her hands swelled and they have been like that every since.

## 2015-08-19 NOTE — Op Note (Signed)
[place]  [address] [place], [address]   COLONOSCOPY PROCEDURE REPORT  PATIENT: Theresa Morrison, Theresa Morrison  MR#: [id number] BIRTHDATE: 27-May-1956 , 59  yrs. old GENDER: female ENDOSCOPIST: Eustace Quail, MD REFERRED [id number] Program Recall PROCEDURE DATE:  08/19/2015 PROCEDURE:   Colonoscopy, surveillance and Colonoscopy with snare polypectomy X4 First Screening Colonoscopy - Avg.  risk and is 50 yrs.  old or older - No.  Prior Negative Screening - Now for repeat screening. N/A  History of Adenoma - Now for follow-up colonoscopy & has been > or = to 3 yrs.  Yes hx of adenoma.  Has been 3 or more years since last colonoscopy.  Polyps removed today? Yes ASA CLASS:   Class III INDICATIONS:Surveillance due to prior colonic neoplasia and PH Colon Adenoma. . Index exam he said 2011 with small tubular adenomas MEDICATIONS: Monitored anesthesia care and Propofol 300 mg IV  DESCRIPTION OF PROCEDURE:   After the risks benefits and alternatives of the procedure were thoroughly explained, informed consent was obtained.  The digital rectal exam revealed no abnormalities of the rectum.   The LB [id number]-[id number]  endoscope was introduced through the anus and advanced to the cecum, which was identified by both the appendix and ileocecal valve. No adverse events experienced.   The quality of the prep was excellent. (Suprep was used)  The instrument was then slowly withdrawn as the colon was fully examined. Estimated blood loss is zero unless otherwise noted in this procedure report.  COLON FINDINGS: Four polyps ranging between 3-50mm in size were found at the cecum and in the ascending colon.  A polypectomy was performed with a cold snare.  The resection was complete, the polyp tissue was completely retrieved and sent to histology.   The examination was otherwise normal.  Retroflexed views revealed internal hemorrhoids. The time to cecum = 3.6 Withdrawal time = 17.5    The scope was withdrawn and the procedure completed. COMPLICATIONS: There were no immediate complications.  ENDOSCOPIC IMPRESSION: 1.   Four polyps were found at the cecum and in the ascending colon; polypectomy was performed with a cold snare 2.   The examination was otherwise normal  RECOMMENDATIONS: 1.Follow up colonoscopy in 3 or 5 years , pending pathology results 2. Continue current medications including Plavix and aspirin  eSigned:  Eustace Quail, MD 08/19/2015 9:23 AM   cc: The Patient and Sharilyn Sites, MD

## 2015-08-20 ENCOUNTER — Telehealth: Payer: Self-pay

## 2015-08-20 NOTE — Telephone Encounter (Signed)
  Follow up Call-  Call back number 08/19/2015  Post procedure Call Back phone  # [phone]  Permission to leave phone message Yes    Patient was called for follow up after procedure on 08/19/2015. No answer at the number given. A message was left on the answering machine.

## 2015-08-24 ENCOUNTER — Encounter: Payer: Self-pay | Admitting: Internal Medicine

## 2015-08-27 ENCOUNTER — Encounter (HOSPITAL_COMMUNITY): Payer: Federal, State, Local not specified - PPO | Attending: Hematology & Oncology

## 2015-08-27 ENCOUNTER — Encounter (HOSPITAL_COMMUNITY): Payer: Self-pay | Admitting: Emergency Medicine

## 2015-08-27 ENCOUNTER — Other Ambulatory Visit (HOSPITAL_COMMUNITY): Payer: Self-pay | Admitting: Oncology

## 2015-08-27 ENCOUNTER — Telehealth (HOSPITAL_COMMUNITY): Payer: Self-pay | Admitting: *Deleted

## 2015-08-27 ENCOUNTER — Telehealth (HOSPITAL_COMMUNITY): Payer: Self-pay | Admitting: Emergency Medicine

## 2015-08-27 ENCOUNTER — Emergency Department (HOSPITAL_COMMUNITY)
Admission: EM | Admit: 2015-08-27 | Discharge: 2015-08-27 | Disposition: A | Discharge status: Home / Self Care | Payer: Federal, State, Local not specified - PPO | Source: Home / Self Care | Attending: Family Medicine | Admitting: Family Medicine

## 2015-08-27 DIAGNOSIS — H838X9 Other specified diseases of inner ear, unspecified ear: Secondary | ICD-10-CM | POA: Diagnosis not present

## 2015-08-27 DIAGNOSIS — D599 Acquired hemolytic anemia, unspecified: Secondary | ICD-10-CM

## 2015-08-27 DIAGNOSIS — D598 Other acquired hemolytic anemias: Secondary | ICD-10-CM | POA: Insufficient documentation

## 2015-08-27 DIAGNOSIS — B20 Human immunodeficiency virus [HIV] disease: Secondary | ICD-10-CM | POA: Diagnosis not present

## 2015-08-27 DIAGNOSIS — H8309 Labyrinthitis, unspecified ear: Secondary | ICD-10-CM | POA: Diagnosis not present

## 2015-08-27 DIAGNOSIS — H8190 Unspecified disorder of vestibular function, unspecified ear: Secondary | ICD-10-CM

## 2015-08-27 LAB — CBC WITH DIFFERENTIAL/PLATELET
BASOS PCT: 0 %
Basophils Absolute: 0 10*3/uL (ref 0.0–0.1)
Eosinophils Absolute: 0 10*3/uL (ref 0.0–0.7)
Eosinophils Relative: 0 %
HEMATOCRIT: 35.4 % — AB (ref 36.0–46.0)
Hemoglobin: 12.4 g/dL (ref 12.0–15.0)
Lymphocytes Relative: 28 %
Lymphs Abs: 0.9 10*3/uL (ref 0.7–4.0)
MCH: 34.3 pg — ABNORMAL HIGH (ref 26.0–34.0)
MCHC: 35 g/dL (ref 30.0–36.0)
MCV: 98.1 fL (ref 78.0–100.0)
MONO ABS: 0.3 10*3/uL (ref 0.1–1.0)
MONOS PCT: 11 %
NEUTROS ABS: 1.9 10*3/uL (ref 1.7–7.7)
Neutrophils Relative %: 61 %
Platelets: 137 10*3/uL — ABNORMAL LOW (ref 150–400)
RBC: 3.61 MIL/uL — ABNORMAL LOW (ref 3.87–5.11)
RDW: 12.1 % (ref 11.5–15.5)
WBC: 3.1 10*3/uL — ABNORMAL LOW (ref 4.0–10.5)

## 2015-08-27 LAB — COMPREHENSIVE METABOLIC PANEL
ALBUMIN: 4.4 g/dL (ref 3.5–5.0)
ALK PHOS: 199 U/L — AB (ref 38–126)
ALT: 24 U/L (ref 14–54)
ANION GAP: 7 (ref 5–15)
AST: 26 U/L (ref 15–41)
BILIRUBIN TOTAL: 0.6 mg/dL (ref 0.3–1.2)
BUN: 15 mg/dL (ref 6–20)
CALCIUM: 9.4 mg/dL (ref 8.9–10.3)
CO2: 28 mmol/L (ref 22–32)
CREATININE: 1.36 mg/dL — AB (ref 0.44–1.00)
Chloride: 109 mmol/L (ref 101–111)
GFR calc Af Amer: 49 mL/min — ABNORMAL LOW (ref >60)
GFR calc non Af Amer: 42 mL/min — ABNORMAL LOW (ref >60)
GLUCOSE: 103 mg/dL — AB (ref 65–99)
Potassium: 4.2 mmol/L (ref 3.5–5.1)
SODIUM: 144 mmol/L (ref 135–145)
TOTAL PROTEIN: 6.7 g/dL (ref 6.5–8.1)

## 2015-08-27 LAB — LACTATE DEHYDROGENASE: LDH: 206 U/L — ABNORMAL HIGH (ref 98–192)

## 2015-08-27 NOTE — Telephone Encounter (Signed)
Orders in  TK

## 2015-08-27 NOTE — ED Notes (Addendum)
Patient woke with dizziness on Thursday morning.  Denies pain.  Patient felt dizzy lying in bed.  "i feel drugged." and "room not spinning".  Patient's radial pulse is regular

## 2015-08-27 NOTE — Progress Notes (Signed)
Theresa Morrison's reason for visit today is for labs as scheduled per MD orders.  Venipuncture performed with a 23 gauge butterfly needle to [place] tolerated procedure well and without incident; questions were answered and patient was discharged.

## 2015-08-27 NOTE — Telephone Encounter (Signed)
Notified pt of hemoglobin, lab results.  Pt notified that if she keep being dizzy to notify us or her PCP. Pt verbalized understanding

## 2015-08-27 NOTE — ED Provider Notes (Signed)
CSN: [id number]     Arrival date & time 08/27/15  1648 History   First MD Initiated Contact with Patient 08/27/15 1820     Chief Complaint  Patient presents with  . Dizziness   (Consider location/radiation/quality/duration/timing/severity/associated sxs/prior Treatment) HPI Comments: 60 year old female awoke with dizziness yesterday. It has persisted since that time. It is not worse but it is not better. Denies vertigo. She states that it is worse when moving her head or changing body positions it is improved with immobility particularly head immobility. She occasionally has minor nausea but no vomiting. Denies problems with vision, speech, hearing, swallowing. Denies headache. Denies diplopia. Denies problems with confusion, orientation or memory. She has a history of HIV she states she is compliant with her daily medications, history of CVA and anemia. She also takes Plavix status post CVA.   Past Medical History  Diagnosis Date  . HIV positive ([place]) 1987  . Colon polyps     adenomatous  . CVA (cerebral vascular accident) ([place])   . Shingles   . Toxoplasmosis   . Hypercholesterolemia   . Hyperlipidemia   . Internal hemorrhoids   . Anemia    Past Surgical History  Procedure Laterality Date  . Tonsillectomy    . Appendectomy  2001  . Knee arthroscopy Bilateral   . Shoulder surgery Right    Family History  Problem Relation Age of Onset  . Heart attack Father   . Breast cancer Mother     mets to lung and brain  . Cancer Mother    Social History  Substance Use Topics  . Smoking status: Never Smoker   . Smokeless tobacco: Never Used  . Alcohol Use: No   OB History    No data available     Review of Systems  Constitutional: Positive for activity change. Negative for fever and fatigue.  HENT: Negative.   Eyes: Negative for photophobia, pain, discharge, redness and visual disturbance.  Respiratory: Negative.   Cardiovascular: Negative.   Gastrointestinal: Negative.    Musculoskeletal: Positive for arthralgias and gait problem. Negative for back pain, neck pain and neck stiffness.  Skin: Negative.   Neurological: Positive for dizziness. Negative for tremors, seizures, syncope, facial asymmetry, speech difficulty, numbness and headaches.  Psychiatric/Behavioral: Negative for behavioral problems, confusion, sleep disturbance, self-injury, dysphoric mood, decreased concentration and agitation.    Allergies  Penicillins; Stavudine; and Sulfamethoxazole-trimethoprim  Home Medications   Prior to Admission medications   Medication Sig Start Date End Date Taking? Authorizing Provider  Abacavir-Dolutegravir-Lamivud [id number] MG TABS Take 1 tablet by mouth daily. 06/08/15   Michel Bickers, MD  acetaminophen (TYLENOL) 500 MG tablet Take 500 mg by mouth every 6 (six) hours as needed.    Historical Provider, MD  aspirin 81 MG tablet Take 81 mg by mouth daily.      Historical Provider, MD  atorvastatin (LIPITOR) 10 MG tablet Take 10 mg by mouth daily.    Historical Provider, MD  beta carotene w/minerals (OCUVITE) tablet Take 2 tablets by mouth daily.    Historical Provider, MD  calcium carbonate (OS-CAL) 600 MG TABS Take 1,200 mg by mouth daily.     Historical Provider, MD  cholecalciferol (VITAMIN D) 1000 units tablet Take 1,000 Units by mouth daily.    Historical Provider, MD  clopidogrel (PLAVIX) 75 MG tablet Take 1 tablet (75 mg total) by mouth daily. 08/09/15   Michel Bickers, MD  furosemide (LASIX) 40 MG tablet Take 40 mg by mouth daily as needed. Reported  on 08/19/2015 11/20/14   Historical Provider, MD  Omega-3 Fatty Acids (FISH OIL) 1000 MG CAPS Take 1 capsule by mouth daily.     Historical Provider, MD  zolpidem (AMBIEN) 10 MG tablet Take 1/2-1 tablet at bedtime as needed for trouble sleeping 10/20/14   Michel Bickers, MD   Meds Ordered and Administered this Visit  Medications - No data to display  BP 110/70 mmHg  Pulse 89  Temp(Src) 98 F (36.7 C) (Oral)   Resp 12 No data found.   Physical Exam  Constitutional: She appears well-developed and well-nourished. No distress.  HENT:  Head: Normocephalic and atraumatic.  Bilateral TMs are normal. Oropharynx is clear and moist. Soft palate rises symmetrically. Tongue and uvula midline.  Eyes: Conjunctivae and EOM are normal. Pupils are equal, round, and reactive to light.  After rotating the head left to right to left or right there is limited leftward nystagmus. Fatigues after approximately 8-9 beats.  Neck: Normal range of motion. Neck supple.  Cardiovascular: Normal rate, regular rhythm, normal heart sounds and intact distal pulses.   Pulmonary/Chest: Effort normal and breath sounds normal. No respiratory distress. She has no wheezes. She has no rales.  Musculoskeletal: Normal range of motion. She exhibits no edema.  Lymphadenopathy:    She has no cervical adenopathy.  Neurological: She is alert. She exhibits normal muscle tone.  Skin: Skin is warm and dry.  Psychiatric: She has a normal mood and affect.  Nursing note and vitals reviewed.   ED Course  Procedures (including critical care time)  Labs Review Labs Reviewed - No data to display  Imaging Review No results found.   Visual Acuity Review  Right Eye Distance:   Left Eye Distance:   Bilateral Distance:    Right Eye Near:   Left Eye Near:    Bilateral Near:         MDM   1. Labyrinthitis, unspecified laterality   2. Vestibular dizziness, unspecified laterality   Meclizine as directed. If not improving by early next week see your doctor, if worse as below go the ED or call EMS. ating and Drinking  Drink enough fluid to keep your urine clear or pale yellow. This helps to keep you from becoming dehydrated. Try to drink more clear fluids, such as water.  Do not drink alcohol.  Limit your caffeine intake if directed by your health care provider.  Limit your salt intake if directed by your health care  provider. Activity  Avoid making quick movements.  Rise slowly from chairs and steady yourself until you feel okay.  In the morning, first sit up on the side of the bed. When you feel okay, stand slowly while you hold onto something until you know that your balance is fine.  Move your legs often if you need to stand in one place for a long time. Tighten and relax your muscles in your legs while you are standing.  Do not drive or operate heavy machinery if you feel dizzy.  Avoid bending down if you feel dizzy. Place items in your home so that they are easy for you to reach without leaning over. Lifestyle  Do not use any tobacco products, including cigarettes, chewing tobacco, or electronic cigarettes. If you need help quitting, ask your health care provider.  Try to reduce your stress level, such as with yoga or meditation. Talk with your health care provider if you need help. General Instructions  Watch your dizziness for any changes.  Take  medicines only as directed by your health care provider. Talk with your health care provider if you think that your dizziness is caused by a medicine that you are taking.  Tell a friend or a family member that you are feeling dizzy. If he or she notices any changes in your behavior, have this person call your health care provider.  Keep all follow-up visits as directed by your health care provider. This is important. SEEK MEDICAL CARE IF:  Your dizziness does not go away.  Your dizziness or light-headedness gets worse.  You feel nauseous.  You have reduced hearing.  You have new symptoms, fever, headache, speech or swallowing problems, weakness on one side of your body, double vision, vomiting.  You are unsteady on your feet or you feel like the room is spinning. SEEK IMMEDIATE MEDICAL CARE IF:  You vomit or have diarrhea and are unable to eat or drink anything.  You have problems talking, walking, swallowing, or using your arms,  hands, or legs.  You feel generally weak.  You are not thinking clearly or you have trouble forming sentences. It may take a friend or family member to notice this.  You have chest pain, abdominal pain, shortness of breath, or sweating.  Your vision changes.  You notice any bleeding.  You have a headache.  You have neck pain or a stiff neck.  You have a fever.       Janne Napoleon, NP 08/27/15 [phone]

## 2015-08-27 NOTE — Discharge Instructions (Signed)
Dizziness Meclizine 12.5 mg to 25 mg every 8 hours as neede for dizziness. Dizziness is a common problem. It is a feeling of unsteadiness or light-headedness. You may feel like you are about to faint. Dizziness can lead to injury if you stumble or fall. Anyone can become dizzy, but dizziness is more common in older adults. This condition can be caused by a number of things, including medicines, dehydration, or illness. HOME CARE INSTRUCTIONS Taking these steps may help with your condition: Eating and Drinking  Drink enough fluid to keep your urine clear or pale yellow. This helps to keep you from becoming dehydrated. Try to drink more clear fluids, such as water.  Do not drink alcohol.  Limit your caffeine intake if directed by your health care provider.  Limit your salt intake if directed by your health care provider. Activity  Avoid making quick movements.  Rise slowly from chairs and steady yourself until you feel okay.  In the morning, first sit up on the side of the bed. When you feel okay, stand slowly while you hold onto something until you know that your balance is fine.  Move your legs often if you need to stand in one place for a long time. Tighten and relax your muscles in your legs while you are standing.  Do not drive or operate heavy machinery if you feel dizzy.  Avoid bending down if you feel dizzy. Place items in your home so that they are easy for you to reach without leaning over. Lifestyle  Do not use any tobacco products, including cigarettes, chewing tobacco, or electronic cigarettes. If you need help quitting, ask your health care provider.  Try to reduce your stress level, such as with yoga or meditation. Talk with your health care provider if you need help. General Instructions  Watch your dizziness for any changes.  Take medicines only as directed by your health care provider. Talk with your health care provider if you think that your dizziness is caused  by a medicine that you are taking.  Tell a friend or a family member that you are feeling dizzy. If he or she notices any changes in your behavior, have this person call your health care provider.  Keep all follow-up visits as directed by your health care provider. This is important. SEEK MEDICAL CARE IF:  Your dizziness does not go away.  Your dizziness or light-headedness gets worse.  You feel nauseous.  You have reduced hearing.  You have new symptoms, fever, headache, speech or swallowing problems, weakness on one side of your body, double vision, vomiting.  You are unsteady on your feet or you feel like the room is spinning. SEEK IMMEDIATE MEDICAL CARE IF:  You vomit or have diarrhea and are unable to eat or drink anything.  You have problems talking, walking, swallowing, or using your arms, hands, or legs.  You feel generally weak.  You are not thinking clearly or you have trouble forming sentences. It may take a friend or family member to notice this.  You have chest pain, abdominal pain, shortness of breath, or sweating.  Your vision changes.  You notice any bleeding.  You have a headache.  You have neck pain or a stiff neck.  You have a fever.   This information is not intended to replace advice given to you by your health care provider. Make sure you discuss any questions you have with your health care provider.   Document Released: 01/03/2001 Document Revised: 11/24/2014  Document Reviewed: 07/06/2014 °Elsevier Interactive Patient Education ©2016 [employer] ° °

## 2015-08-28 ENCOUNTER — Encounter (HOSPITAL_COMMUNITY): Payer: Self-pay | Admitting: Hematology & Oncology

## 2015-08-28 LAB — HAPTOGLOBIN: Haptoglobin: <10 mg/dL — ABNORMAL LOW (ref 34–200)

## 2015-09-14 ENCOUNTER — Encounter (HOSPITAL_COMMUNITY): Payer: Federal, State, Local not specified - PPO

## 2015-09-14 DIAGNOSIS — D598 Other acquired hemolytic anemias: Secondary | ICD-10-CM | POA: Diagnosis not present

## 2015-09-14 DIAGNOSIS — B20 Human immunodeficiency virus [HIV] disease: Secondary | ICD-10-CM

## 2015-09-14 LAB — CBC WITH DIFFERENTIAL/PLATELET
BASOS ABS: 0 10*3/uL (ref 0.0–0.1)
Basophils Relative: 1 %
EOS PCT: 0 %
Eosinophils Absolute: 0 10*3/uL (ref 0.0–0.7)
HEMATOCRIT: 36.2 % (ref 36.0–46.0)
Hemoglobin: 12.9 g/dL (ref 12.0–15.0)
LYMPHS PCT: 23 %
Lymphs Abs: 0.8 10*3/uL (ref 0.7–4.0)
MCH: 35 pg — ABNORMAL HIGH (ref 26.0–34.0)
MCHC: 35.6 g/dL (ref 30.0–36.0)
MCV: 98.1 fL (ref 78.0–100.0)
Monocytes Absolute: 0.4 10*3/uL (ref 0.1–1.0)
Monocytes Relative: 13 %
NEUTROS ABS: 2.2 10*3/uL (ref 1.7–7.7)
Neutrophils Relative %: 63 %
PLATELETS: 136 10*3/uL — AB (ref 150–400)
RBC: 3.69 MIL/uL — AB (ref 3.87–5.11)
RDW: 12.6 % (ref 11.5–15.5)
WBC: 3.4 10*3/uL — AB (ref 4.0–10.5)

## 2015-09-14 LAB — COMPREHENSIVE METABOLIC PANEL
ALBUMIN: 4.6 g/dL (ref 3.5–5.0)
ALT: 24 U/L (ref 14–54)
AST: 24 U/L (ref 15–41)
Alkaline Phosphatase: 189 U/L — ABNORMAL HIGH (ref 38–126)
Anion gap: 7 (ref 5–15)
BUN: 17 mg/dL (ref 6–20)
CHLORIDE: 108 mmol/L (ref 101–111)
CO2: 29 mmol/L (ref 22–32)
CREATININE: 1.29 mg/dL — AB (ref 0.44–1.00)
Calcium: 9.5 mg/dL (ref 8.9–10.3)
GFR calc Af Amer: 52 mL/min — ABNORMAL LOW (ref >60)
GFR, EST NON AFRICAN AMERICAN: 45 mL/min — AB (ref >60)
GLUCOSE: 51 mg/dL — AB (ref 65–99)
Potassium: 3.8 mmol/L (ref 3.5–5.1)
Sodium: 144 mmol/L (ref 135–145)
Total Bilirubin: 0.7 mg/dL (ref 0.3–1.2)
Total Protein: 6.8 g/dL (ref 6.5–8.1)

## 2015-09-14 LAB — LACTATE DEHYDROGENASE: LDH: 190 U/L (ref 98–192)

## 2015-09-15 LAB — HAPTOGLOBIN

## 2015-10-05 ENCOUNTER — Other Ambulatory Visit: Payer: Federal, State, Local not specified - PPO

## 2015-10-05 DIAGNOSIS — B20 Human immunodeficiency virus [HIV] disease: Secondary | ICD-10-CM

## 2015-10-05 LAB — COMPREHENSIVE METABOLIC PANEL
ALBUMIN: 4.4 g/dL (ref 3.6–5.1)
ALK PHOS: 158 U/L — AB (ref 33–130)
ALT: 19 U/L (ref 6–29)
AST: 24 U/L (ref 10–35)
BILIRUBIN TOTAL: 0.8 mg/dL (ref 0.2–1.2)
BUN: 15 mg/dL (ref 7–25)
CO2: 27 mmol/L (ref 20–31)
CREATININE: 1.27 mg/dL — AB (ref 0.50–1.05)
Calcium: 9.5 mg/dL (ref 8.6–10.4)
Chloride: 102 mmol/L (ref 98–110)
GLUCOSE: 86 mg/dL (ref 65–99)
Potassium: 3.7 mmol/L (ref 3.5–5.3)
SODIUM: 144 mmol/L (ref 135–146)
Total Protein: 6.1 g/dL (ref 6.1–8.1)

## 2015-10-05 LAB — LIPID PANEL
CHOLESTEROL: 152 mg/dL (ref 125–200)
HDL: 50 mg/dL (ref >=46)
LDL Cholesterol: 88 mg/dL (ref <130)
TRIGLYCERIDES: 68 mg/dL (ref <150)
Total CHOL/HDL Ratio: 3 Ratio (ref <=5.0)
VLDL: 14 mg/dL (ref <30)

## 2015-10-05 LAB — CBC
HCT: 34.8 % — ABNORMAL LOW (ref 36.0–46.0)
HEMOGLOBIN: 12 g/dL (ref 12.0–15.0)
MCH: 34.2 pg — AB (ref 26.0–34.0)
MCHC: 34.5 g/dL (ref 30.0–36.0)
MCV: 99.1 fL (ref 78.0–100.0)
MPV: 8.7 fL (ref 8.6–12.4)
Platelets: 125 10*3/uL — ABNORMAL LOW (ref 150–400)
RBC: 3.51 MIL/uL — AB (ref 3.87–5.11)
RDW: 13.3 % (ref 11.5–15.5)
WBC: 2.9 10*3/uL — ABNORMAL LOW (ref 4.0–10.5)

## 2015-10-06 LAB — T-HELPER CELL (CD4) - (RCID CLINIC ONLY)
CD4 T CELL ABS: 200 /uL — AB (ref 400–2700)
CD4 T CELL HELPER: 46 % (ref 33–55)

## 2015-10-06 LAB — RPR

## 2015-10-06 LAB — HIV-1 RNA QUANT-NO REFLEX-BLD

## 2015-10-14 ENCOUNTER — Encounter: Payer: Self-pay | Admitting: Neurology

## 2015-10-14 ENCOUNTER — Ambulatory Visit (INDEPENDENT_AMBULATORY_CARE_PROVIDER_SITE_OTHER): Payer: Federal, State, Local not specified - PPO | Admitting: Neurology

## 2015-10-14 VITALS — BP 106/68 | HR 63 | Ht 64.0 in | Wt 164.0 lb

## 2015-10-14 DIAGNOSIS — R110 Nausea: Secondary | ICD-10-CM

## 2015-10-14 DIAGNOSIS — R519 Headache, unspecified: Secondary | ICD-10-CM | POA: Insufficient documentation

## 2015-10-14 DIAGNOSIS — H532 Diplopia: Secondary | ICD-10-CM | POA: Diagnosis not present

## 2015-10-14 DIAGNOSIS — H905 Unspecified sensorineural hearing loss: Secondary | ICD-10-CM

## 2015-10-14 DIAGNOSIS — R42 Dizziness and giddiness: Secondary | ICD-10-CM | POA: Insufficient documentation

## 2015-10-14 DIAGNOSIS — R51 Headache: Secondary | ICD-10-CM | POA: Diagnosis not present

## 2015-10-14 DIAGNOSIS — H9190 Unspecified hearing loss, unspecified ear: Secondary | ICD-10-CM

## 2015-10-14 MED ORDER — ONDANSETRON 4 MG PO TBDP: 4.0000 mg | ORAL_TABLET | Freq: Three times a day (TID) | ORAL | Status: DC | PRN

## 2015-10-14 NOTE — Progress Notes (Signed)
[facility]    Provider:  Dr Jaynee Eagles Referring Provider: Sharilyn Sites, MD Primary Care Physician:  [name], MD  CC:  Vertigo  HPI:  Theresa Morrison is a 60 y.o. female here as a referral from Dr. Hilma Favors for vertigo. She has a past medical history of HIV, CVA, toxoplasmosis, hypercholesterolemia, autoimmune hemolytic anemia, peripheral neuropathy. Symptoms started the first of February with a spinning feeling which lasted for a few seconds. She has a dizzy feeling. Vertigo happens when she turn her head or looks up or looks down. She noticed it in bed and she rolled over and felt the "world spinning" for several seconds. She had nausea but no vomiting. If she held her head real still it would stop. It lasted a few weeks, she was given valium and that helped a little. There isn't as much room spinning anymore but now she feels continuously dizzy with a mild headache. When she looks at numbers she sees blurry sometimes. She can't tell if 11 or 1 for example. Blurry vision. Symptoms are still worse with moving head quickly. Even when she is laying down she can feel dizzy, not just when walking or standing or sitting. Definitely exacerbated by head movements. She takes aspirin every day 81mg  and Plavix for stroke prevention.. No previous illnesses before the vertigo. No trauma or any inciting events. She has hearing changes in the left ear. Blurry vision. She also has headache. The headache is all over on the top of the head, pressure. She has that every day. It lasts a few hours a day sometimes, not severe but mild and is not intractable. Denies photophobia or phonophobia. Denies current nausea or vomiting  vision currently is normal. She denies weakness, dysarthria, dysphasia, aphasia, sensory changes or other focal neurologic deficits.  Reviewed notes, labs and imaging from outside physicians, which showed:  MRI of the brain 2001:  [place] LOBE CONSISTENT WITH REGION OF PATIENT'S PRIOR INFARCT. WITHIN THE LEFT SYLVIAN FISSURE, THERE IS HYPERINTENSITY ON ALL PULSE SEQUENCES WHICH MAY REPRESENT A PARTIALLY THROMBOSED VESSEL OR A REGION OF LAMINAR NECROSIS RELATED TO THE PATIENT'S PRIOR INFARCT. SMALL AMOUNT OF ACUTE BLOOD CANNOT BE COMPLETELY EXCLUDED ALTHOUGH IT IS THOUGHT TO BE A LESS LIKELY CONSIDERATION. NO EVIDENCE OF HYDROCEPHALUS OR MIDLINE SHIFT. MAJOR INTRACRANIAL VASCULAR STRUCTURES ARE PATENT. PARTIAL OPACIFICATION WITH AIR FLUID LEVEL, RIGHT MAXILLARY SINUS. MINIMAL PARTIAL OPACIFICATION, ETHMOID SINUS AIR CELLS. IMPRESSION UNENHANCED MR OF THE BRAIN WAS PERFORMED AS PER REQUEST. 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 PLEASE SEE ABOVE. SINUS DISEASE WITH AIR FLUID LEVEL, RIGHT MAXILLARY SINUS.  CBC significant for low blood cells 2.9, platelets 125 which are both chronic findings. CMP with creatinine of 1.27 and elevated alkaline phosphatase at 158 which are both chronic findings. CD4 T cells 200. RPR negative.  Reviewed notes from [facility]. Patient was seen on February 14 of this year complaining of 1-2 weeks of vertigo. She described it as room spinning, lasting for seconds, triggered by rolling over in bed, triggered by bending over, aggravated by head movement, not aggravated by exertion coughing or loud noises. She denied nausea and vomiting and tinnitus. She denied hearing loss. She denied aural fullness. She denied photophobia and phonophobia or postural instability. She was diagnosed with benign peripheral positional vertigo. She was given Valium 2 mg 3 times a day when necessary. She was given 1 cc  of Depo-Medrol.  Review of Systems: Patient complains of symptoms per HPI as well as the following symptoms: Spinning sensation, memory loss, confusion, headache, dizziness. Pertinent negatives per HPI.  All others negative.   Social History   Social History  . Marital Status: Married    Spouse Name: Theresa Morrison  . Number of Children: 2  . Years of Education: 12   Occupational History  .     Social History Main Topics  . Smoking status: Never Smoker   . Smokeless tobacco: Never Used  . Alcohol Use: No  . Drug Use: No  . Sexual Activity: Not Currently     Comment: declined condoms   Other Topics Concern  . Not on file   Social History Narrative   Lives with spouse and son   Caffeine use: 12oz daily    Family History  Problem Relation Age of Onset  . Heart attack Father   . High Cholesterol Father   . Breast cancer Mother     mets to lung and brain  . Cancer Mother   . Prostate cancer Brother   . Stroke Neg Hx   . Migraines Neg Hx   . Neuropathy Neg Hx     Past Medical History  Diagnosis Date  . HIV positive ([place]) 1987  . Colon polyps     adenomatous  . CVA (cerebral vascular accident) ([place])   . Shingles   . Toxoplasmosis   . Hypercholesterolemia   . Hyperlipidemia   . Internal hemorrhoids   . Anemia   . Headache     Past Surgical History  Procedure Laterality Date  . Tonsillectomy    . Appendectomy  2001  . Knee arthroscopy Bilateral   . Shoulder surgery Right     Current Outpatient Prescriptions  Medication Sig Dispense Refill  . Abacavir-Dolutegravir-Lamivud 600-50-300 MG TABS Take 1 tablet by mouth daily. 90 tablet 3  . acetaminophen (TYLENOL) 500 MG tablet Take 500 mg by mouth every 6 (six) hours as needed.    Marland Kitchen aspirin 81 MG tablet Take 81 mg by mouth daily.      Marland Kitchen atorvastatin (LIPITOR) 10 MG tablet Take 10 mg by mouth daily.    . beta carotene w/minerals (OCUVITE) tablet Take 2 tablets by mouth daily.    . calcium carbonate (OS-CAL) 600 MG TABS Take 1,200 mg by mouth daily.     . Cholecalciferol (VITAMIN D-3) 1000 units CAPS Take 1,000 Units by mouth daily.    . clopidogrel (PLAVIX) 75 MG tablet Take 1 tablet (75 mg total) by mouth daily. 90  tablet 3  . furosemide (LASIX) 40 MG tablet Take 40 mg by mouth daily as needed. Reported on 08/19/2015  1  . Omega-3 Fatty Acids (FISH OIL) 1000 MG CAPS Take 1 capsule by mouth daily.     Marland Kitchen zolpidem (AMBIEN) 10 MG tablet Take 1/2-1 tablet at bedtime as needed for trouble sleeping 30 tablet 2  . ondansetron (ZOFRAN ODT) 4 MG disintegrating tablet Take 1 tablet (4 mg total) by mouth every 8 (eight) hours as needed for nausea or vomiting. 60 tablet 3   No current facility-administered medications for this visit.    Allergies as of 10/14/2015 - Review Complete 10/14/2015  Allergen Reaction Noted  . Penicillins    . Stavudine    . Sulfamethoxazole-trimethoprim      Vitals: BP 106/68 mmHg  Pulse 63  Ht 5\' 4"  (1.626 m)  Wt 164 lb (74.39 kg)  BMI 28.14 kg/m2  Last Weight:  Wt Readings from Last 1 Encounters:  10/14/15 164 lb (74.39 kg)   Last Height:   Ht Readings from Last 1 Encounters:  10/14/15 5\' 4"  (1.626 m)   Physical exam: Exam: Gen: NAD, conversant               CV: RRR, no MRG. No Carotid Bruits. No peripheral edema, warm, nontender Eyes: Conjunctivae clear without exudates or hemorrhage  Neuro: Detailed Neurologic Exam  Speech:    Speech is normal; fluent and spontaneous with normal comprehension.  Cognition:    The patient is oriented to person, place, and time;     recent and remote memory intact;     language fluent;     normal attention, concentration,     fund of knowledge Cranial Nerves:    The pupils are equal, round, and reactive to light. The fundi are without edema. Visual fields are full to finger confrontation. Extraocular movements are intact. Trigeminal sensation is intact and the muscles of mastication are normal. The face is symmetric. The palate elevates in the midline. Hearing intact. Voice is normal. Shoulder shrug is normal. The tongue has normal motion without fasciculations.   Coordination:    Normal finger to nose and heel to shin. Normal  rapid alternating movements.   Gait:    Heel-toe and tandem gait are normal.   Motor Observation:    No asymmetry, no atrophy, and no involuntary movements noted. Tone:    Normal muscle tone.    Posture:    Posture is normal. normal erect    Strength:    Strength is V/V in the upper and lower limbs.      Sensation: intact to LT     Reflex Exam:  DTR's:    Deep tendon reflexes in the upper and lower extremities are normal bilaterally.   Toes:    The toes are downgoing bilaterally.   Clonus:    Clonus is absent.       Assessment/Plan:   60 y.o. female here as a referral from Dr. Hilma Favors for vertigo. She has a past medical history of HIV, CVA(encephalomalacia of the left frontal lobe seen on MRI in 2001), toxoplasmosis, hypercholesterolemia, anemia. Symptoms started the first of February with a spinning feeling when she rolled over in bed. She complains of vertigo, dizziness, blurry vision, mild headache. Symptoms most consistent with BPPV but given her past medical history of HIV and CVA we'll perform MRI of the brain and MRA of the head. May consider lumbar puncture.  Vertigo and hearing changes; ENT evaluation MRI of the brain w/wo contrast and MRA of the head Vestibular rehab Zofran for nausea and dizziness If headache or symptoms worsen, patient is instructed to go directly to the emergency room for evaluation and possible lumbar puncture given her immunocompromised state. If imaging is unrevealing and symptoms especially headache persist or worsen may consider lumbar puncture. Neurologic exam is nonfocal.  CC: Dr. Hilma Favors  Patient will follow up after imaging with me.  To prevent or relieve headaches, try the following: Cool Compress. Lie down and place a cool compress on your head.  Avoid headache triggers. If certain foods or odors seem to have triggered your migraines in the past, avoid them. A headache diary might help you identify triggers.  Include physical  activity in your daily routine. Try a daily walk or other moderate aerobic exercise.  Manage stress. Find healthy ways to cope with the stressors, such as delegating tasks on  your to-do list.  Practice relaxation techniques. Try deep breathing, yoga, massage and visualization.  Eat regularly. Eating regularly scheduled meals and maintaining a healthy diet might help prevent headaches. Also, drink plenty of fluids.  Follow a regular sleep schedule. Sleep deprivation might contribute to headaches Consider biofeedback. With this mind-body technique, you learn to control certain bodily functions - such as muscle tension, heart rate and blood pressure - to prevent headaches or reduce headache pain.    Proceed to emergency room if you experience new or worsening symptoms or symptoms do not resolve, if you have new neurologic symptoms or if headache is severe, or for any concerning symptom.    Sarina Ill, MD  [facility] [address] [place], [place] [address]  Phone [phone] Fax [phone]

## 2015-10-14 NOTE — Patient Instructions (Signed)
Remember to drink plenty of fluid, eat healthy meals and do not skip any meals. Try to eat protein with a every meal and eat a healthy snack such as fruit or nuts in between meals. Try to keep a regular sleep-wake schedule and try to exercise daily, particularly in the form of walking, 20-30 minutes a day, if you can.   As far as diagnostic testing: Imaging of the brain, ENT referral, Vestibular rehab  I would like to see you back in 3 months, sooner if we need to. Please call us with any interim questions, concerns, problems, updates or refill requests.   Our phone number is [phone]. We also have an after hours call service for urgent matters and there is a physician on-call for urgent questions. For any emergencies you know to call 911 or go to the nearest emergency room

## 2015-10-17 ENCOUNTER — Encounter: Payer: Self-pay | Admitting: Neurology

## 2015-10-19 ENCOUNTER — Ambulatory Visit (INDEPENDENT_AMBULATORY_CARE_PROVIDER_SITE_OTHER): Payer: Federal, State, Local not specified - PPO | Admitting: Internal Medicine

## 2015-10-19 ENCOUNTER — Encounter: Payer: Self-pay | Admitting: Internal Medicine

## 2015-10-19 DIAGNOSIS — B20 Human immunodeficiency virus [HIV] disease: Secondary | ICD-10-CM | POA: Diagnosis not present

## 2015-10-19 MED ORDER — CLOPIDOGREL BISULFATE 75 MG PO TABS: 75.0000 mg | ORAL_TABLET | Freq: Every day | ORAL | Status: DC

## 2015-10-19 NOTE — Assessment & Plan Note (Signed)
Her CD4 count has been quite volatile over the past few years but her viral load remains consistently undetectable. Overall her infection is extremely well controlled. She will continue Triumeq and follow-up in 6 months. I do not think that any of her recent vertigo, tinnitus or headache is related to her infection or antiretroviral therapy.

## 2015-10-19 NOTE — Progress Notes (Signed)
Patient Active Problem List   Diagnosis Date Noted  . Hemolytic anemia (HCC)     Priority: High  . Renal insufficiency 01/09/2012    Priority: High  . Dyslipidemia 05/11/2011    Priority: High  . Human immunodeficiency virus (HIV) disease ([place]) 10/11/2006    Priority: High  . History of cardiovascular disorder 10/02/2006    Priority: High  . Headache 10/14/2015  . Vertigo 10/14/2015  . Diplopia 10/14/2015  . Hand pain 04/22/2015  . 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 Elevated alkaline phosphatase level 01/14/2013  . Hip pain, left 03/08/2011  . ROTATOR CUFF SYNDROME, RIGHT 07/12/2010  . 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 RECTAL BLEEDING 10/11/2006  . SYMPTOM, ABNORMAL LOSS OF WEIGHT 10/11/2006  . 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 LIPODYSTROPHY 10/02/2006  . PERIPHERAL NEUROPATHY 10/02/2006  . ARTHROSCOPY, KNEE, HX OF 10/02/2006    Patient's Medications  New Prescriptions   No medications on file  Previous Medications   ABACAVIR-DOLUTEGRAVIR-LAMIVUD 600-50-300 MG TABS    Take 1 tablet by mouth daily.   ACETAMINOPHEN (TYLENOL) 500 MG TABLET    Take 500 mg by mouth every 6 (six) hours as needed.   ASPIRIN 81 MG TABLET    Take 81 mg by mouth daily.     ATORVASTATIN (LIPITOR) 10 MG TABLET    Take 10 mg by mouth daily.   BETA CAROTENE W/MINERALS (OCUVITE) TABLET    Take 2 tablets by mouth daily.   CALCIUM CARBONATE (OS-CAL) 600 MG TABS    Take 1,200 mg by mouth daily.    CHOLECALCIFEROL (VITAMIN D-3) 1000 UNITS CAPS    Take 1,000 Units by mouth daily.   FUROSEMIDE (LASIX) 40 MG TABLET    Take 40 mg by mouth daily as needed. Reported on 10/19/2015   OMEGA-3 FATTY ACIDS (FISH OIL) 1000 MG CAPS    Take 1 capsule by mouth daily.    ONDANSETRON (ZOFRAN ODT) 4 MG DISINTEGRATING TABLET    Take 1 tablet (4 mg total) by mouth every 8 (eight) hours as needed for nausea or vomiting.   ZOLPIDEM (AMBIEN) 10 MG TABLET    Take 1/2-1 tablet at bedtime as  needed for trouble sleeping  Modified Medications   Modified Medication Previous Medication   CLOPIDOGREL (PLAVIX) 75 MG TABLET clopidogrel (PLAVIX) 75 MG tablet      Take 1 tablet (75 mg total) by mouth daily.    Take 1 tablet (75 mg total) by mouth daily.  Discontinued Medications   No medications on file    Subjective: Chadwick is in for her routine HIV follow-up visit. As usual she never misses a single dose of her Triumeq. She is tolerating it well. She recently developed some vertigo and a sensation that she was spinning. She was started on low-dose Valium but that simply made her sleepy. She is also had some issues with tinnitus. She has had no hearing loss. Her vertigo has resolved but she still has been bothered by some very mild, dull headache. She has been evaluated by neurology and is scheduled for a brain MRI on 10/26/2015.   Review of Systems: Review of Systems  Constitutional: Negative for fever, chills, weight loss, malaise/fatigue and diaphoresis.  HENT: Positive for tinnitus. Negative for congestion, ear pain and sore throat.        As noted in history of present illness.  Respiratory: Negative for cough, sputum production and shortness of  breath.   Cardiovascular: Negative for chest pain.  Gastrointestinal: Negative for nausea, vomiting, abdominal pain and diarrhea.  Musculoskeletal: Negative for myalgias and joint pain.  Skin: Negative for rash.  Neurological: Positive for headaches. Negative for dizziness, speech change and focal weakness.  Psychiatric/Behavioral: Negative for depression and substance abuse. The patient is not nervous/anxious.     Past Medical History  Diagnosis Date  . HIV positive ([place]) 1987  . Colon polyps     adenomatous  . CVA (cerebral vascular accident) ([place])   . Shingles   . Toxoplasmosis   . Hypercholesterolemia   . Hyperlipidemia   . Internal hemorrhoids   . Anemia   . Headache     Social History  Substance Use Topics  .  Smoking status: Never Smoker   . Smokeless tobacco: Never Used  . Alcohol Use: No    Family History  Problem Relation Age of Onset  . Heart attack Father   . High Cholesterol Father   . Breast cancer Mother     mets to lung and brain  . Cancer Mother   . Prostate cancer Brother   . Stroke Neg Hx   . Migraines Neg Hx   . Neuropathy Neg Hx     Allergies  Allergen Reactions  . Penicillins     REACTION: facial swelling  . Stavudine     REACTION: peripheral neuropathy  . Sulfamethoxazole-Trimethoprim     REACTION: hives    Objective:  Filed Vitals:   10/19/15 1021  BP: 109/71  Pulse: 64  Temp: 98 F (36.7 C)  TempSrc: Oral  Height: 5' 4.25" (1.632 m)  Weight: 164 lb (74.39 kg)   Body mass index is 27.93 kg/(m^2).  Physical Exam  Constitutional: She is oriented to person, place, and time.  She is in no distress. No changes noted in her lipodystrophy and central adiposity.  HENT:  Mouth/Throat: No oropharyngeal exudate.  Eyes: Conjunctivae are normal.  Neck: Neck supple.  Cardiovascular: Normal rate and regular rhythm.   No murmur heard. Pulmonary/Chest: Breath sounds normal.  Abdominal: Soft. She exhibits no mass. There is no tenderness.  Musculoskeletal: Normal range of motion.  Neurological: She is alert and oriented to person, place, and time.  Skin: No rash noted.  Psychiatric: Mood and affect normal.    Lab Results Lab Results  Component Value Date   WBC 2.9* 10/05/2015   HGB 12.0 10/05/2015   HCT 34.8* 10/05/2015   MCV 99.1 10/05/2015   PLT 125* 10/05/2015    Lab Results  Component Value Date   CREATININE 1.27* 10/05/2015   BUN 15 10/05/2015   NA 144 10/05/2015   K 3.7 10/05/2015   CL 102 10/05/2015   CO2 27 10/05/2015    Lab Results  Component Value Date   ALT 19 10/05/2015   AST 24 10/05/2015   ALKPHOS 158* 10/05/2015   BILITOT 0.8 10/05/2015    Lab Results  Component Value Date   CHOL 152 10/05/2015   HDL 50 10/05/2015    LDLCALC 88 10/05/2015   TRIG 68 10/05/2015   CHOLHDL 3.0 10/05/2015    Lab Results HIV 1 RNA QUANT (copies/mL)  Date Value  10/05/2015 <20  04/08/2015 <20  10/01/2014 <20   CD4 T CELL ABS (/uL)  Date Value  10/05/2015 200*  04/08/2015 340*  10/01/2014 470      Problem List Items Addressed This Visit      High   Human immunodeficiency virus (HIV) disease ([place])  Her CD4 count has been quite volatile over the past few years but her viral load remains consistently undetectable. Overall her infection is extremely well controlled. She will continue Triumeq and follow-up in 6 months. I do not think that any of her recent vertigo, tinnitus or headache is related to her infection or antiretroviral therapy.      Relevant Orders   T-helper cell (CD4)- ([facility] clinic only)   HIV 1 RNA quant-no reflex-bld   CBC   Comprehensive metabolic panel        Michel Bickers, MD [facility] [place] [phone] pager   [phone] cell 10/19/2015, 10:59 AM

## 2015-10-26 ENCOUNTER — Ambulatory Visit
Admission: RE | Admit: 2015-10-26 | Discharge: 2015-10-26 | Disposition: A | Discharge status: Home / Self Care | Payer: Federal, State, Local not specified - PPO | Source: Ambulatory Visit | Attending: Neurology | Admitting: Neurology

## 2015-10-26 DIAGNOSIS — R42 Dizziness and giddiness: Secondary | ICD-10-CM

## 2015-10-26 DIAGNOSIS — R519 Headache, unspecified: Secondary | ICD-10-CM

## 2015-10-26 DIAGNOSIS — H532 Diplopia: Secondary | ICD-10-CM | POA: Diagnosis not present

## 2015-10-26 DIAGNOSIS — H9190 Unspecified hearing loss, unspecified ear: Secondary | ICD-10-CM

## 2015-10-26 DIAGNOSIS — R51 Headache: Secondary | ICD-10-CM | POA: Diagnosis not present

## 2015-10-26 MED ORDER — GADOBENATE DIMEGLUMINE 529 MG/ML IV SOLN
15.0000 mL | Freq: Once | INTRAVENOUS | Status: AC | PRN
Start: 2015-10-26 — End: 2015-10-26
  Administered 2015-10-26: 15 mL via INTRAVENOUS

## 2015-11-01 ENCOUNTER — Telehealth: Payer: Self-pay | Admitting: *Deleted

## 2015-11-01 NOTE — Telephone Encounter (Signed)
-----   Message from Melvenia Beam, MD sent at 10/30/2015  5:11 PM EDT ----- There were no new findings on MRi of the brain. MRA of the head was normal. This is good news, no new strokes or other brain pathology causing symptoms thanks.

## 2015-11-01 NOTE — Telephone Encounter (Signed)
Called pt and spoke to her about MRI/MRA results per Dr Jaynee Eagles results. Pt stated she is scheduled 4/19 with Dr Redmond Baseman. Advised her to keep f/u appt in June with Dr Jaynee Eagles. Pt verbalized understanding.

## 2015-11-10 ENCOUNTER — Encounter (HOSPITAL_COMMUNITY): Payer: Self-pay | Admitting: Hematology & Oncology

## 2015-11-10 ENCOUNTER — Encounter (HOSPITAL_COMMUNITY): Payer: Federal, State, Local not specified - PPO

## 2015-11-10 ENCOUNTER — Encounter (HOSPITAL_COMMUNITY): Payer: Federal, State, Local not specified - PPO | Attending: Hematology & Oncology | Admitting: Hematology & Oncology

## 2015-11-10 VITALS — BP 110/62 | HR 58 | Temp 97.9°F | Resp 18 | Wt 165.0 lb

## 2015-11-10 DIAGNOSIS — D5919 Other autoimmune hemolytic anemia: Secondary | ICD-10-CM

## 2015-11-10 DIAGNOSIS — B20 Human immunodeficiency virus [HIV] disease: Secondary | ICD-10-CM | POA: Diagnosis not present

## 2015-11-10 DIAGNOSIS — D598 Other acquired hemolytic anemias: Secondary | ICD-10-CM | POA: Insufficient documentation

## 2015-11-10 DIAGNOSIS — H9313 Tinnitus, bilateral: Secondary | ICD-10-CM | POA: Diagnosis not present

## 2015-11-10 DIAGNOSIS — H9193 Unspecified hearing loss, bilateral: Secondary | ICD-10-CM | POA: Diagnosis not present

## 2015-11-10 DIAGNOSIS — D7589 Other specified diseases of blood and blood-forming organs: Secondary | ICD-10-CM | POA: Diagnosis not present

## 2015-11-10 DIAGNOSIS — D591 Other autoimmune hemolytic anemias: Secondary | ICD-10-CM | POA: Diagnosis not present

## 2015-11-10 DIAGNOSIS — R42 Dizziness and giddiness: Secondary | ICD-10-CM | POA: Diagnosis not present

## 2015-11-10 LAB — COMPREHENSIVE METABOLIC PANEL
ALT: 21 U/L (ref 14–54)
AST: 21 U/L (ref 15–41)
Albumin: 4.3 g/dL (ref 3.5–5.0)
Alkaline Phosphatase: 149 U/L — ABNORMAL HIGH (ref 38–126)
Anion gap: 5 (ref 5–15)
BUN: 11 mg/dL (ref 6–20)
CO2: 27 mmol/L (ref 22–32)
CREATININE: 1.09 mg/dL — AB (ref 0.44–1.00)
Calcium: 9.3 mg/dL (ref 8.9–10.3)
Chloride: 110 mmol/L (ref 101–111)
GFR calc Af Amer: >60 mL/min (ref >60)
GFR, EST NON AFRICAN AMERICAN: 55 mL/min — AB (ref >60)
Glucose, Bld: 85 mg/dL (ref 65–99)
Potassium: 3.7 mmol/L (ref 3.5–5.1)
SODIUM: 142 mmol/L (ref 135–145)
Total Bilirubin: 0.6 mg/dL (ref 0.3–1.2)
Total Protein: 6.5 g/dL (ref 6.5–8.1)

## 2015-11-10 LAB — CBC WITH DIFFERENTIAL/PLATELET
BASOS ABS: 0 10*3/uL (ref 0.0–0.1)
Basophils Relative: 0 %
EOS PCT: 0 %
Eosinophils Absolute: 0 10*3/uL (ref 0.0–0.7)
HCT: 33.7 % — ABNORMAL LOW (ref 36.0–46.0)
Hemoglobin: 12 g/dL (ref 12.0–15.0)
LYMPHS PCT: 26 %
Lymphs Abs: 0.7 10*3/uL (ref 0.7–4.0)
MCH: 35.7 pg — ABNORMAL HIGH (ref 26.0–34.0)
MCHC: 35.6 g/dL (ref 30.0–36.0)
MCV: 100.3 fL — AB (ref 78.0–100.0)
Monocytes Absolute: 0.3 10*3/uL (ref 0.1–1.0)
Monocytes Relative: 11 %
NEUTROS ABS: 1.8 10*3/uL (ref 1.7–7.7)
NEUTROS PCT: 63 %
PLATELETS: 128 10*3/uL — AB (ref 150–400)
RBC: 3.36 MIL/uL — AB (ref 3.87–5.11)
RDW: 12.9 % (ref 11.5–15.5)
WBC: 2.8 10*3/uL — AB (ref 4.0–10.5)

## 2015-11-10 LAB — LACTATE DEHYDROGENASE: LDH: 205 U/L — ABNORMAL HIGH (ref 98–192)

## 2015-11-10 LAB — VITAMIN B12: VITAMIN B 12: 374 pg/mL (ref 180–914)

## 2015-11-10 LAB — FOLATE: Folate: 26.6 ng/mL (ref >5.9)

## 2015-11-10 NOTE — Progress Notes (Signed)
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 female is here for follow-up of a history of hemolytic anemia. She is currently in remission.   Theresa Morrison is here alone. I personally reviewed and went over laboratory results with the patient.  She has been experiencing nausea and dizziness, attributed to vertigo. Notes the last time she had vertigo she came to the front desk on a Friday and was told she could not have an appointment for blood work until Monday. She thought her dizziness may have been secondary to a recurrence of her hemolysis. She has currently been evaluated by neurology and had a head MRI on 10/27/2015.  She wears braces on her upper extremities at night. She has not been doing any crafts or work she did previously so it is hard for her to say whether her hands have improved or not.   Her appetite and bowels are good. She denies any new pain. She denies SOB.  She is up to date on her mammograms and other well care.  MEDICAL HISTORY:  Past Medical History  Diagnosis Date  . HIV positive ([place]) 1987  . Colon polyps     adenomatous  . CVA (cerebral vascular accident) ([place])   . Shingles   . Toxoplasmosis   . Hypercholesterolemia   . Hyperlipidemia   . Internal hemorrhoids   . Anemia   . Headache     SURGICAL HISTORY: Past Surgical History  Procedure  Laterality Date  . Tonsillectomy    . Appendectomy  2001  . Knee arthroscopy Bilateral   . Shoulder surgery Right     SOCIAL HISTORY: Social History   Social History  . Marital Status: Married    Spouse Name: [name]  . Number of Children: 2  . Years of Education: 12   Occupational History  .     Social History Main Topics  . Smoking status: Never Smoker   . Smokeless tobacco: Never Used  . Alcohol Use: No  . Drug Use: No  . Sexual Activity: Not Currently     Comment: declined condoms   Other Topics Concern  . Not on file   Social History Narrative   Lives with spouse and son   Caffeine use: 12oz daily    FAMILY HISTORY: Family History  Problem Relation Age of Onset  . Heart attack Father   . High Cholesterol Father   . Breast cancer Mother     mets to lung and brain  . Cancer Mother   . Prostate cancer Brother   . Stroke Neg Hx   . Migraines Neg Hx   . Neuropathy Neg Hx    indicated that her mother is deceased. She indicated that her father is deceased.   Mother died at 67 from breast cancer Father died at 72 from MI 1 sister, 3 brothers all healthy  ALLERGIES:  is allergic to penicillins; stavudine; and sulfamethoxazole-trimethoprim.  MEDICATIONS:  Current Outpatient Prescriptions  Medication Sig Dispense Refill  . Abacavir-Dolutegravir-Lamivud 600-50-300 MG TABS Take 1 tablet by mouth daily. 90 tablet 3  . acetaminophen (TYLENOL) 500 MG tablet Take 500 mg by mouth every 6 (six) hours as needed.    Marland Kitchen aspirin 81 MG tablet Take 81 mg by mouth daily.      Marland Kitchen atorvastatin (LIPITOR) 10 MG tablet Take 10 mg by mouth daily.    . beta carotene w/minerals (OCUVITE) tablet Take 2 tablets by mouth daily.    . calcium carbonate (OS-CAL) 600 MG TABS Take 1,200 mg by mouth daily.     . Cholecalciferol (VITAMIN D-3) 1000 units CAPS Take 1,000 Units by mouth daily.    . clopidogrel (PLAVIX) 75 MG tablet Take 1 tablet (75 mg total) by mouth daily. 90 tablet 3  .  furosemide (LASIX) 40 MG tablet Take 40 mg by mouth daily as needed. Reported on 10/19/2015  1  . Omega-3 Fatty Acids (FISH OIL) 1000 MG CAPS Take 1 capsule by mouth daily.     Marland Kitchen zolpidem (AMBIEN) 10 MG tablet Take 1/2-1 tablet at bedtime as needed for trouble sleeping 30 tablet 2  . ondansetron (ZOFRAN ODT) 4 MG disintegrating tablet Take 1 tablet (4 mg total) by mouth every 8 (eight) hours as needed for nausea or vomiting. (Patient not taking: Reported on 10/19/2015) 60 tablet 3   No current facility-administered medications for this visit.    Review of Systems  Constitutional: Positive for malaise/fatigue. Negative for fever, chills, weight loss.  HENT: Negative for congestion, hearing loss, nosebleeds, sore throat and tinnitus.   Eyes: Negative for blurred vision, double vision, pain and discharge.  Respiratory: Negative for cough, hemoptysis, sputum production, shortness of breath and wheezing.   Cardiovascular: Negative for chest pain, palpitations, claudication, leg swelling, and PND.  Gastrointestinal: Positive for nausea. Negative for heartburn, nausea, vomiting, abdominal pain, diarrhea, constipation, blood in stool and melena.  Nausea attributed to vertigo. Genitourinary: Negative for dysuria, urgency, frequency and hematuria.  Musculoskeletal: Positive for joint pain. Negative for myalgias and falls. Hand swelling and pain.  Skin: Negative for itching and rash.  Neurological: Positive for dizziness. Negative for dizziness, tingling, tremors, sensory change, speech change, focal weakness, seizures, loss of consciousness, weakness and headaches.  Dizziness attributed to vertigo. Endo/Heme/Allergies: Does not bruise/bleed easily.  Psychiatric/Behavioral: Negative for depression, suicidal ideas, memory loss and substance abuse. The patient is not nervous/anxious and does not have insomnia.   14 point review of systems was performed and is negative except as detailed under history of  present illness and above   PHYSICAL EXAMINATION: ECOG PERFORMANCE STATUS: 1 - Symptomatic but completely ambulatory  Filed Vitals:   11/10/15 1200  BP: 110/62  Pulse: 58  Temp: 97.9 F (36.6 C)  Resp: 18   Filed Weights   11/10/15 1200  Weight: 165 lb (74.844 kg)    Physical Exam  Constitutional: She is oriented to person, place, and time and well-developed, well-nourished, and in no distress.  Lipodystrophy.  HENT:  Head: Normocephalic and atraumatic.  Nose: Nose normal.  Mouth/Throat: Oropharynx is clear and moist. No oropharyngeal exudate.  Eyes: Conjunctivae and EOM are normal. Pupils are equal, round, and reactive to light. Right eye exhibits no discharge. Left eye exhibits no discharge. No scleral icterus.  Neck: Normal range of motion. Neck supple. No tracheal deviation present. No thyromegaly present.  Cardiovascular: Normal rate, regular rhythm and normal heart sounds.  Exam reveals no gallop and no friction rub.   No  murmur heard. Pulmonary/Chest: Effort normal and breath sounds normal. She has no wheezes. She has no rales.  Abdominal: Soft. Bowel sounds are normal. She exhibits no distension and no mass. There is no tenderness. There is no rebound and no guarding.  Musculoskeletal: Normal range of motion.  Lymphadenopathy:    She has no cervical adenopathy.  Neurological: She is alert and oriented to person, place, and time. She has normal reflexes. No cranial nerve deficit. Gait normal. Coordination normal.  Skin: Skin is warm and dry. No rash noted.  Psychiatric: Mood, memory, affect and judgment normal.  Nursing note and vitals reviewed.   LABORATORY DATA:  I have reviewed the data as listed Results for Theresa Morrison, Theresa Morrison (MRN [id number]) as of 11/10/2015 11:38  Ref. Range 11/10/2015 10:02  Sodium Latest Ref Range: 135-145 mmol/L 142  Potassium Latest Ref Range: 3.5-5.1 mmol/L 3.7  Chloride Latest Ref Range: 101-111 mmol/L 110  CO2 Latest Ref Range: 22-32  mmol/L 27  BUN Latest Ref Range: 6-20 mg/dL 11  Creatinine Latest Ref Range: 0.44-1.00 mg/dL 1.09 (H)  Calcium Latest Ref Range: 8.9-10.3 mg/dL 9.3  EGFR (Non-African Amer.) Latest Ref Range: >60 mL/min 55 (L)  EGFR (African American) Latest Ref Range: >60 mL/min >60  Glucose Latest Ref Range: 65-99 mg/dL 85  Anion gap Latest Ref Range: 5-15  5  Alkaline Phosphatase Latest Ref Range: 38-126 U/L 149 (H)  Albumin Latest Ref Range: 3.5-5.0 g/dL 4.3  AST Latest Ref Range: 15-41 U/L 21  ALT Latest Ref Range: 14-54 U/L 21  Total Protein Latest Ref Range: 6.5-8.1 g/dL 6.5  Total Bilirubin Latest Ref Range: 0.3-1.2 mg/dL 0.6  LDH Latest Ref Range: 98-192 U/L 205 (H)  WBC Latest Ref Range: 4.0-10.5 K/uL 2.8 (L)  RBC Latest Ref Range: 3.87-5.11 MIL/uL 3.36 (L)  Hemoglobin Latest Ref Range: 12.0-15.0 g/dL 12.0  HCT Latest Ref Range: 36.0-46.0 % 33.7 (L)  MCV Latest Ref Range: 78.0-100.0 fL 100.3 (H)  MCH Latest Ref Range: 26.0-34.0 pg 35.7 (H)  MCHC Latest Ref Range: 30.0-36.0 g/dL 35.6  RDW Latest Ref Range: 11.5-15.5 % 12.9  Platelets Latest Ref Range: 150-400 K/uL 128 (L)  Neutrophils Latest Units: % 63  Lymphocytes Latest Units: % 26  Monocytes Relative Latest Units: % 11  Eosinophil Latest Units: % 0  Basophil Latest Units: % 0  NEUT# Latest Ref Range: 1.7-7.7 K/uL 1.8  Lymphocyte # Latest Ref Range: 0.7-4.0 K/uL 0.7  Monocyte # Latest Ref Range: 0.1-1.0 K/uL 0.3  Eosinophils Absolute Latest Ref Range: 0.0-0.7 K/uL 0.0  Basophils Absolute Latest Ref Range: 0.0-0.1 K/uL 0.0   RADIOLOGY: I have personally reviewed the radiological images as listed and agreed with the findings in the report. Study Result     EXAM: DUAL X-RAY ABSORPTIOMETRY (DXA) FOR BONE MINERAL DENSITY  IMPRESSION: Ordering Physician: Dr. Patrici Ranks,  Your patient Renn Gurry completed a BMD test on 04/13/2015 using the [place] (software version: 14.10) manufactured by [employer]. The following summarizes the results of our evaluation.  PATIENT BIOGRAPHICAL: Name: THOMASINE, KLUTTS Patient ID: [id number] Birth Date: [birth year] Height: 64.0 in. Gender: Female Exam Date: 04/13/2015 Weight: 166.0 lbs.  Indications: Caucasian, Follow up Osteopenia, Post Menopausal, Secondary Osteoporosis Fractures: Treatments: Calcium, Multivitamin, Vitamin D  DENSITOMETRY RESULTS: Site Region Measured Date Measured Age WHO Classification Young Adult T-score BMD %Change vs. Previous Significant Change (*)  AP Spine L1-L3 04/13/2015 57.9 Normal -0.1 1.153 g/cm2  DualFemur Neck Left 04/13/2015 57.9 Osteopenia -  2.0 0.765 g/cm2  ASSESSMENT: BMD as determined from Femur Neck Left is 0.765 g/cm2 with a T-Score of -2.0. This patient is considered osteopenic according to [facility]) criteria. (L-4 was excluded due to advanced degenerative changes.)  World Health Organization [facility]) criteria for post-menopausal, Caucasian Women: Normal: T-score at or above -1 SD Osteopenia: T-score between -1 and -2.5 SD Osteoporosis: T-score at or below -2.5 SD  RECOMMENDATIONS: [place] recommends that FDA-approved medial therapies be considered in postmenopausal women and men age 12 or older with a: 1. Hip or vertebral (clinical or morphometric) fracture. 2. T-Score of < -2.5 at the spine or hip. 3. Ten-year fracture probability by FRAX of 3% or greater for hip fracture or 20% or greater for major osteoporotic fracture.  All treatment decisions require clinical judgment and consideration of individual patient factors, including patient preferences, co-morbidities, previous drug use, risk factors not captured in the FRAX model (e.g. falls, vitamin D deficiency, increased bone turnover, interval significant decline in bone density) and possible under-or over-estimation of fracture risk by  FRAX.  All patients should ensure an adequate intake of dietary calcium (1200 mg/d) and vitamin D (800 IU daily) unless contraindicated.  FOLLOW-UP: People with diagnosed cases of osteoporosis or osteopenia should be regularly tested for bone mineral density. For patients eligible for Medicare, routine testing is allowed once every 2 years. Testing frequency can be increased for patients who have rapidly progressing disease, or for those who are receiving medical therapy to restore bone mass.  Your patient Satoya S Muniz completed a FRAX assessment on 04/13/2015 using the [place] (analysis version: 14.10) manufactured by [employer]. The following summarizes the results of our evaluation.  PATIENT BIOGRAPHICAL: Name: CONITA, AMENTA Patient ID: [id number] Birth Date: Nov 24, 1955 Height: 64.0 in. Gender: Female [age] Weight: 166.0 lbs. Ethnicity: White Exam Date: 04/13/2015  FRAX* RESULTS: (version: 3.5) 10-year Probability of Fracture1 Major Osteoporotic Fracture2 Hip Fracture 8.6% 1.0% Population: [place] (Caucasian) Risk Factors: Secondary Osteoporosis  Based on Femur (Left) Neck BMD  1 -The 10-year probability of fracture may be lower than reported if the patient has received treatment. 2 -Major Osteoporotic Fracture: Clinical Spine, Forearm, Hip or Shoulder  *FRAX is a Materials engineer of the [employer] for Metabolic Bone Disease, [place] (WHO) [facility].  ASSESSMENT: The probability of a major osteoporotic fracture is 8.6% within the next ten years.  The probability of a hip fracture is 1.0% within the next ten years.   Electronically Signed  By: Earle Gell M.D.  On: 04/13/2015 10:31    ASSESSMENT & PLAN:   Coombs positive hemolytic anemia, remission Steroid responsive HIV Left lower extremity  swelling Persistently low haptoglobin Mild thrombocytopenia Macrocytosis   Blood counts are excellent. Haptoglobin remains persistently low yet LDH is stable. She may have a low grade hemolysis but would continue with observation, nothing needs to be treated currently. Thrombocytopenia is mild and stable. She does not have known liver disease.  I have added B12 and folate given her macrocytosis.   I have strongly encouraged her to come in for lab work if she develops significant fatigue or concern that her hemolysis has recurred. I have added standing orders for her and given her Haley's contact information if she ever is concerned about relapse to avoid confusion or delays in obtaining labs in the future.   She will return in 4 months for follow up.   Orders Placed This  Encounter  Procedures  . Vitamin B12    Standing Status: Future     Number of Occurrences: 1     Standing Expiration Date: 11/09/2016  . Folate    Standing Status: Future     Number of Occurrences: 1     Standing Expiration Date: 11/09/2016    All questions were answered. The patient knows to call the clinic with any problems, questions or concerns  This document serves as a record of services personally performed by Ancil Linsey, MD. It was created on her behalf by Arlyce Harman, a trained medical scribe. The creation of this record is based on the scribe's personal observations and the provider's statements to them. This document has been checked and approved by the attending provider.  I have reviewed the above documentation for accuracy and completeness, and I agree with the above.  This note was electronically signed. Molli Hazard, MD  11/11/2015 6:44 PM

## 2015-11-10 NOTE — Patient Instructions (Signed)
[place] at [facility] Discharge Instructions  RECOMMENDATIONS MADE BY THE CONSULTANT AND ANY TEST RESULTS WILL BE SENT TO YOUR REFERRING PHYSICIAN.    Exam and discussion by Dr Whitney Muse today Lab work today, labs stable today Will call you with the results of those labs  Hildred Alamin is our nurse navigator [phone], you can call if you need lab work and we can make you an appt Return to see the doctor in 4 months with labs  Please call the clinic if you have any questions or concerns    Thank you for choosing [place] at [facility] to provide your oncology and hematology care.  To afford each patient quality time with our provider, please arrive at least 15 minutes before your scheduled appointment time.   Beginning January 23rd 2017 lab work for the [facility] will be done in the  Main lab at [facility] on 1st floor. If you have a lab appointment with the [facility] please come in thru the  Main Entrance and check in at the main information desk  You need to re-schedule your appointment should you arrive 10 or more minutes late.  We strive to give you quality time with our providers, and arriving late affects you and other patients whose appointments are after yours.  Also, if you no show three or more times for appointments you may be dismissed from the clinic at the providers discretion.     Again, thank you for choosing [facility].  Our hope is that these requests will decrease the amount of time that you wait before being seen by our physicians.       _____________________________________________________________  Should you have questions after your visit to [facility], please contact our office at [phone] between the hours of 8:30 a.m. and 4:30 p.m.  Voicemails left after 4:30 p.m. will not be returned until the following business day.  For prescription refill requests, have your pharmacy  contact our office.         Resources For Cancer Patients and their Caregivers ? American Cancer Society: Can assist with transportation, wigs, general needs, runs Look Good Feel Better.        (740)574-7268 ? Cancer Care: Provides financial assistance, online support groups, medication/co-pay assistance.  1-800-813-HOPE (367)638-9523) ? [facility] Assists [place] cancer patients and their families through emotional , educational and financial support.  231 550 0033 ? [place] DSS Where to apply for food stamps, Medicaid and utility assistance. 914-245-8718 ? [facility]: Transportation to medical appointments. 361-398-5184 ? Social Security Administration: May apply for disability if have a Stage IV cancer. (330) 165-6638 (774) 512-3488 ? [employer], Disability and Transit Services: Assists with nutrition, care and transit needs. [phone]

## 2015-11-11 LAB — HAPTOGLOBIN: Haptoglobin: 13 mg/dL — ABNORMAL LOW (ref 34–200)

## 2016-01-13 ENCOUNTER — Ambulatory Visit (INDEPENDENT_AMBULATORY_CARE_PROVIDER_SITE_OTHER): Payer: Federal, State, Local not specified - PPO | Admitting: Neurology

## 2016-01-13 VITALS — BP 100/61 | HR 58 | Ht 64.25 in | Wt 170.2 lb

## 2016-01-13 DIAGNOSIS — R42 Dizziness and giddiness: Secondary | ICD-10-CM | POA: Diagnosis not present

## 2016-01-13 MED ORDER — DIAZEPAM 2 MG PO TABS: ORAL_TABLET | ORAL | Status: DC

## 2016-01-13 NOTE — Patient Instructions (Signed)
Remember to drink plenty of fluid, eat healthy meals and do not skip any meals. Try to eat protein with a every meal and eat a healthy snack such as fruit or nuts in between meals. Try to keep a regular sleep-wake schedule and try to exercise daily, particularly in the form of walking, 20-30 minutes a day, if you can. .   Our phone number is [phone]. We also have an after hours call service for urgent matters and there is a physician on-call for urgent questions. For any emergencies you know to call 911 or go to the nearest emergency room   

## 2016-01-13 NOTE — Progress Notes (Signed)
[id number]    Provider:  Dr Jaynee Eagles Referring Provider: Sharilyn Sites, MD Primary Care Physician:  [name], MD   CC: Vertigo  Interval history 01/13/2016: MRI of the brain was unchanged, no acute causes of her vertigo. She saw the ENT and everything looked ok. She did not do vestibular rehab because she feeling better. Only twice this month. When last seen it was every day. It happens in the morning when she rolls over. When is does happen it lasts several hours.   HPI: Theresa Morrison is a 60 y.o. female here as a referral from Dr. Hilma Favors for vertigo. She has a past medical history of HIV, CVA, toxoplasmosis, hypercholesterolemia, autoimmune hemolytic anemia, peripheral neuropathy. Symptoms started the first of February with a spinning feeling which lasted for a few seconds. She has a dizzy feeling. Vertigo happens when she turn her head or looks up or looks down. She noticed it in bed and she rolled over and felt the "world spinning" for several seconds. She had nausea but no vomiting. If she held her head real still it would stop. It lasted a few weeks, she was given valium and that helped a little. There isn't as much room spinning anymore but now she feels continuously dizzy with a mild headache. When she looks at numbers she sees blurry sometimes. She can't tell if 11 or 1 for example. Blurry vision. Symptoms are still worse with moving head quickly. Even when she is laying down she can feel dizzy, not just when walking or standing or sitting. Definitely exacerbated by head movements. She takes aspirin every day 81mg  and Plavix for stroke prevention.. No previous illnesses before the vertigo. No trauma or any inciting events. She has hearing changes in the left ear. Blurry vision. She also has headache. The headache is all over on the top of the head, pressure. She has that every day. It lasts a few hours a day sometimes, not severe but mild and is not  intractable. Denies photophobia or phonophobia. Denies current nausea or vomiting vision currently is normal. She denies weakness, dysarthria, dysphasia, aphasia, sensory changes or other focal neurologic deficits.  Reviewed notes, labs and imaging from outside physicians, which showed:  MRI of the brain 2001:  Sharon LOBE CONSISTENT WITH REGION OF PATIENT'S PRIOR INFARCT. WITHIN THE LEFT SYLVIAN FISSURE, THERE IS HYPERINTENSITY ON ALL PULSE SEQUENCES WHICH MAY REPRESENT A PARTIALLY THROMBOSED VESSEL OR A REGION OF LAMINAR NECROSIS RELATED TO THE PATIENT'S PRIOR INFARCT. SMALL AMOUNT OF ACUTE BLOOD CANNOT BE COMPLETELY EXCLUDED ALTHOUGH IT IS THOUGHT TO BE A LESS LIKELY CONSIDERATION. NO EVIDENCE OF HYDROCEPHALUS OR MIDLINE SHIFT. MAJOR INTRACRANIAL VASCULAR STRUCTURES ARE PATENT. PARTIAL OPACIFICATION WITH AIR FLUID LEVEL, RIGHT MAXILLARY SINUS. MINIMAL PARTIAL OPACIFICATION, ETHMOID SINUS AIR CELLS. IMPRESSION UNENHANCED MR OF THE BRAIN WAS PERFORMED AS PER REQUEST. 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 PLEASE SEE ABOVE. SINUS DISEASE WITH AIR FLUID LEVEL, RIGHT MAXILLARY SINUS.  CBC significant for low blood cells 2.9, platelets 125 which are both chronic findings. CMP with creatinine of 1.27 and elevated alkaline phosphatase at 158 which are both chronic findings. CD4 T cells 200. RPR negative.  Reviewed notes from [facility]. Patient was seen on February 14 of this year complaining of 1-2 weeks of vertigo. She described it as room spinning, lasting for seconds, triggered by rolling over in bed, triggered by bending  over, aggravated by head movement, not aggravated by exertion coughing or loud noises. She denied nausea and vomiting and tinnitus. She denied hearing loss. She denied aural fullness. She denied photophobia and phonophobia or postural instability. She  was diagnosed with benign peripheral positional vertigo. She was given Valium 2 mg 3 times a day when necessary. She was given 1 cc of Depo-Medrol.  Review of Systems: Patient complains of symptoms per HPI as well as the following symptoms: Spinning sensation, memory loss, confusion, headache, dizziness. Pertinent negatives per HPI. All others negative.   Social History   Social History  . Marital Status: Married    Spouse Name: [name]  . Number of Children: 2  . Years of Education: 12   Occupational History  .     Social History Main Topics  . Smoking status: Never Smoker   . Smokeless tobacco: Never Used  . Alcohol Use: No  . Drug Use: No  . Sexual Activity: Not Currently     Comment: declined condoms   Other Topics Concern  . Not on file   Social History Narrative   Lives with spouse and son   Caffeine use: 12oz daily    Family History  Problem Relation Age of Onset  . Heart attack Father   . High Cholesterol Father   . Breast cancer Mother     mets to lung and brain  . Cancer Mother   . Prostate cancer Brother   . Stroke Neg Hx   . Migraines Neg Hx   . Neuropathy Neg Hx     Past Medical History  Diagnosis Date  . HIV positive ([place]) 1987  . Colon polyps     adenomatous  . CVA (cerebral vascular accident) ([place])   . Shingles   . Toxoplasmosis   . Hypercholesterolemia   . Hyperlipidemia   . Internal hemorrhoids   . Anemia   . Headache     Past Surgical History  Procedure Laterality Date  . Tonsillectomy    . Appendectomy  2001  . Knee arthroscopy Bilateral   . Shoulder surgery Right     Current Outpatient Prescriptions  Medication Sig Dispense Refill  . Abacavir-Dolutegravir-Lamivud 600-50-300 MG TABS Take 1 tablet by mouth daily. 90 tablet 3  . acetaminophen (TYLENOL) 500 MG tablet Take 500 mg by mouth every 6 (six) hours as needed.    Marland Kitchen aspirin 81 MG tablet Take 81 mg by mouth daily.      Marland Kitchen atorvastatin (LIPITOR) 10 MG tablet Take 10 mg by  mouth daily.    . beta carotene w/minerals (OCUVITE) tablet Take 2 tablets by mouth daily.    . calcium carbonate (OS-CAL) 600 MG TABS Take 1,200 mg by mouth daily.     . Cholecalciferol (VITAMIN D-3) 1000 units CAPS Take 1,000 Units by mouth daily.    . clopidogrel (PLAVIX) 75 MG tablet Take 1 tablet (75 mg total) by mouth daily. 90 tablet 3  . Omega-3 Fatty Acids (FISH OIL) 1000 MG CAPS Take 1 capsule by mouth daily.     Marland Kitchen zolpidem (AMBIEN) 10 MG tablet Take 1/2-1 tablet at bedtime as needed for trouble sleeping 30 tablet 2   No current facility-administered medications for this visit.    Allergies as of 01/13/2016 - Review Complete 01/13/2016  Allergen Reaction Noted  . Penicillins    . Stavudine    . Sulfamethoxazole-trimethoprim      Vitals: BP 100/61 mmHg  Pulse 58  Ht 5' 4.25" (1.632  m)  Wt 170 lb 3.2 oz (77.202 kg)  BMI 28.99 kg/m2 Last Weight:  Wt Readings from Last 1 Encounters:  01/13/16 170 lb 3.2 oz (77.202 kg)   Last Height:   Ht Readings from Last 1 Encounters:  01/13/16 5' 4.25" (1.632 m)    Physical exam: Exam: Gen: NAD, conversant  CV: RRR, no MRG. No Carotid Bruits. No peripheral edema, warm, nontender Eyes: Conjunctivae clear without exudates or hemorrhage  Neuro: Detailed Neurologic Exam  Speech:  Speech is normal; fluent and spontaneous with normal comprehension.  Cognition:  The patient is oriented to person, place, and time;   recent and remote memory intact;   language fluent;   normal attention, concentration,   fund of knowledge Cranial Nerves:  The pupils are equal, round, and reactive to light. The fundi are without edema. Visual fields are full to finger confrontation. Extraocular movements are intact. Trigeminal sensation is intact and the muscles of mastication are normal. The face is symmetric. The palate elevates in the midline. Hearing intact. Voice is normal. Shoulder shrug is normal. The tongue  has normal motion without fasciculations.   Coordination:  Normal finger to nose and heel to shin. Normal rapid alternating movements.   Gait:  Heel-toe and tandem gait are normal.   Motor Observation:  No asymmetry, no atrophy, and no involuntary movements noted. Tone:  Normal muscle tone.   Posture:  Posture is normal. normal erect   Strength:  Strength is V/V in the upper and lower limbs.    Sensation: intact to LT   Reflex Exam:  DTR's:  Deep tendon reflexes in the upper and lower extremities are normal bilaterally.  Toes:  The toes are downgoing bilaterally.  Clonus:  Clonus is absent.      Assessment/Plan: 60 y.o. female here as a referral from Dr. Hilma Favors for vertigo. She has a past medical history of HIV, CVA(encephalomalacia of the left frontal lobe seen on MRI in 2001), toxoplasmosis, hypercholesterolemia, anemia. Symptoms started the first of February with a spinning feeling when she rolled over in bed. She complains of vertigo, dizziness, blurry vision, mild headache. Symptoms most consistent with BPPV but given her past medical history of HIV and CVA we'll perform MRI of the brain and MRA of the head. May consider lumbar puncture.  Vertigo and hearing changes; ENT evaluation was unremarkable MRI of the brain w/wo contrast and MRA of the head no acute changes to explain vertigo Vestibular rehab - on hold, she got better Zofran for nausea and dizziness If headache or symptoms worsen, patient is instructed to go directly to the emergency room for evaluation and possible lumbar puncture given her immunocompromised state. If imaging is unrevealing and symptoms especially headache persist or worsen may consider lumbar puncture. Neurologic exam is nonfocal. Diazepam 1mg  prn for vertigo.    CC: Dr. Hilma Favors  To prevent or relieve headaches, try the following:  Cool Compress. Lie down and place a cool compress on your head.    Avoid headache triggers. If certain foods or odors seem to have triggered your migraines in the past, avoid them. A headache diary might help you identify triggers.   Include physical activity in your daily routine. Try a daily walk or other moderate aerobic exercise.   Manage stress. Find healthy ways to cope with the stressors, such as delegating tasks on your to-do list.   Practice relaxation techniques. Try deep breathing, yoga, massage and visualization.   Eat regularly. Eating regularly scheduled meals and  maintaining a healthy diet might help prevent headaches. Also, drink plenty of fluids.   Follow a regular sleep schedule. Sleep deprivation might contribute to headaches  Consider biofeedback. With this mind-body technique, you learn to control certain bodily functions - such as muscle tension, heart rate and blood pressure - to prevent headaches or reduce headache pain.   Proceed to emergency room if you experience new or worsening symptoms or symptoms do not resolve, if you have new neurologic symptoms or if headache is severe, or for any concerning symptom.       Assessment/Plan:    Sarina Ill, MD  [facility] [address] [place], [place] [address]  Phone [phone] Fax [phone]  A total of 15 minutes was spent face-to-face with this patient. Over half this time was spent on counseling patient on the vertigo diagnosis and different diagnostic and therapeutic options available.

## 2016-03-02 ENCOUNTER — Other Ambulatory Visit: Payer: Self-pay | Admitting: Internal Medicine

## 2016-03-02 DIAGNOSIS — H353122 Nonexudative age-related macular degeneration, left eye, intermediate dry stage: Secondary | ICD-10-CM | POA: Diagnosis not present

## 2016-03-02 DIAGNOSIS — H43393 Other vitreous opacities, bilateral: Secondary | ICD-10-CM | POA: Diagnosis not present

## 2016-03-02 DIAGNOSIS — G4700 Insomnia, unspecified: Secondary | ICD-10-CM

## 2016-03-02 DIAGNOSIS — H353112 Nonexudative age-related macular degeneration, right eye, intermediate dry stage: Secondary | ICD-10-CM | POA: Diagnosis not present

## 2016-03-02 DIAGNOSIS — H524 Presbyopia: Secondary | ICD-10-CM | POA: Diagnosis not present

## 2016-03-02 DIAGNOSIS — H02839 Dermatochalasis of unspecified eye, unspecified eyelid: Secondary | ICD-10-CM | POA: Diagnosis not present

## 2016-03-02 DIAGNOSIS — H2513 Age-related nuclear cataract, bilateral: Secondary | ICD-10-CM | POA: Diagnosis not present

## 2016-03-02 DIAGNOSIS — H25013 Cortical age-related cataract, bilateral: Secondary | ICD-10-CM | POA: Diagnosis not present

## 2016-03-02 MED ORDER — ZOLPIDEM TARTRATE 10 MG PO TABS: ORAL_TABLET | ORAL | 0 refills | Status: DC

## 2016-03-13 NOTE — Progress Notes (Signed)
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 female is here for follow-up of a history of hemolytic anemia. She is currently in remission.   Theresa Morrison is here alone. I personally reviewed and went over laboratory results with the patient.  She has been experiencing nausea and dizziness, attributed to vertigo. She did see Dr. Jaynee Eagles, head imaging was ordered which revealed a prior infarct but nothing new. She notes symptoms now come and go. She has recently traveled with her husband on vacation and notes she had a great time.   She wears braces on her upper extremities at night. She notes this has significantly improved her hand pain.  Her appetite and bowels are good. She denies any new pain. She denies SOB.  She is up to date on her mammograms and other well care.  MEDICAL HISTORY:  Past Medical History:  Diagnosis Date  . Anemia   . Colon polyps    adenomatous  . CVA (cerebral vascular accident) ([place])   . Headache   . HIV positive ([place]) 1987  . Hypercholesterolemia   . Hyperlipidemia   . Internal hemorrhoids   . Shingles   . Toxoplasmosis     SURGICAL HISTORY: Past Surgical History:  Procedure Laterality Date  . APPENDECTOMY  2001  . KNEE ARTHROSCOPY Bilateral   . SHOULDER SURGERY Right   . TONSILLECTOMY      SOCIAL HISTORY: Social History    Social History  . Marital status: Married    Spouse name: [name]  . Number of children: 2  . Years of education: 12   Occupational History  .  Unemployed   Social History Main Topics  . Smoking status: Never Smoker  . Smokeless tobacco: Never Used  . Alcohol use No  . Drug use: No  . Sexual activity: Not Currently     Comment: declined condoms   Other Topics Concern  . Not on file   Social History Narrative   Lives with spouse and son   Caffeine use: 12oz daily    FAMILY HISTORY: Family History  Problem Relation Age of Onset  . Heart attack Father   . High Cholesterol Father   . Breast cancer Mother     mets to lung and brain  . Cancer Mother   . Prostate cancer Brother   . Stroke Neg Hx   . Migraines Neg Hx   . Neuropathy Neg Hx    indicated that her mother is deceased. She indicated that her father is deceased. She indicated that the status of her brother is unknown. She indicated that the status of her neg hx is unknown.    Mother died at 22 from breast cancer Father died at 18 from MI 1 sister, 3 brothers all healthy  ALLERGIES:  is allergic to penicillins; stavudine; and sulfamethoxazole-trimethoprim.  MEDICATIONS:  Current Outpatient Prescriptions  Medication Sig Dispense Refill  . Abacavir-Dolutegravir-Lamivud 600-50-300 MG TABS Take 1 tablet by mouth daily.  90 tablet 3  . acetaminophen (TYLENOL) 500 MG tablet Take 500 mg by mouth every 6 (six) hours as needed.    Marland Kitchen aspirin 81 MG tablet Take 81 mg by mouth daily.      Marland Kitchen atorvastatin (LIPITOR) 10 MG tablet Take 10 mg by mouth daily.    . beta carotene w/minerals (OCUVITE) tablet Take 2 tablets by mouth daily.    . calcium carbonate (OS-CAL) 600 MG TABS Take 1,200 mg by mouth daily.     . Cholecalciferol (VITAMIN D-3) 1000 units CAPS Take 1,000 Units by mouth daily.    . clopidogrel (PLAVIX) 75 MG tablet Take 1 tablet (75 mg total) by mouth daily. 90 tablet 3  . diazepam (VALIUM) 2 MG tablet Take 1  tablet (2 mg total) by mouth every 6 (six) hours as needed for vertigo. May try 1/2 pill as well. 20 tablet 4  . Omega-3 Fatty Acids (FISH OIL) 1000 MG CAPS Take 1 capsule by mouth daily.     Marland Kitchen zolpidem (AMBIEN) 10 MG tablet Take 1/2-1 tablet at bedtime as needed for trouble sleeping 30 tablet 0   No current facility-administered medications for this visit.     Review of Systems  Constitutional: Positive for malaise/fatigue. Negative for fever, chills, weight loss.  HENT: Negative for congestion, hearing loss, nosebleeds, sore throat and tinnitus.   Eyes: Negative for blurred vision, double vision, pain and discharge.  Respiratory: Negative for cough, hemoptysis, sputum production, shortness of breath and wheezing.   Cardiovascular: Negative for chest pain, palpitations, claudication, leg swelling, and PND.  Gastrointestinal: Positive for nausea, intermittent and improved. Negative for heartburn, nausea, vomiting, abdominal pain, diarrhea, constipation, blood in stool and melena.  Nausea attributed to vertigo. Genitourinary: Negative for dysuria, urgency, frequency and hematuria.  Musculoskeletal: Positive for joint pain. Negative for myalgias and falls. Hand swelling and pain.  Skin: Negative for itching and rash.  Neurological: Positive for dizziness, improved. Negative for dizziness, tingling, tremors, sensory change, speech change, focal weakness, seizures, loss of consciousness, weakness and headaches.  Dizziness attributed to vertigo. Endo/Heme/Allergies: Does not bruise/bleed easily.  Psychiatric/Behavioral: Negative for depression, suicidal ideas, memory loss and substance abuse. The patient is not nervous/anxious and does not have insomnia.   14 point review of systems was performed and is negative except as detailed under history of present illness and above   PHYSICAL EXAMINATION: ECOG PERFORMANCE STATUS: 1 - Symptomatic but completely ambulatory  Vitals:   03/14/16 1213   BP: (!) 108/54  Pulse: (!) 58  Resp: 16  Temp: 98.3 F (36.8 C)   Filed Weights   03/14/16 1213  Weight: 171 lb (77.6 kg)    Physical Exam  Constitutional: She is oriented to person, place, and time and well-developed, well-nourished, and in no distress.  Lipodystrophy.  HENT:  Head: Normocephalic and atraumatic.  Nose: Nose normal.  Mouth/Throat: Oropharynx is clear and moist. No oropharyngeal exudate.  Eyes: Conjunctivae and EOM are normal. Pupils are equal, round, and reactive to light. Right eye exhibits no discharge. Left eye exhibits no discharge. No scleral icterus.  Neck: Normal range of motion. Neck supple. No tracheal deviation present. No thyromegaly present.  Cardiovascular: Normal rate, regular rhythm and normal heart sounds.  Exam reveals no gallop and no friction rub.   No murmur heard. Pulmonary/Chest: Effort normal and breath sounds normal. She has no wheezes. She has no rales.  Abdominal: Soft. Bowel sounds are normal. She exhibits no distension and no mass. There is no  tenderness. There is no rebound and no guarding.  Musculoskeletal: Normal range of motion.  Lymphadenopathy:    She has no cervical adenopathy.  Neurological: She is alert and oriented to person, place, and time. She has normal reflexes. No cranial nerve deficit. Gait normal. Coordination normal.  Skin: Skin is warm and dry. No rash noted.  Psychiatric: Mood, memory, affect and judgment normal.  Nursing note and vitals reviewed.   LABORATORY DATA:  I have reviewed the data as listed Results for BREXLEE, HEBERLEIN (MRN [id number])  Results for JUSTYNA, TIMONEY (MRN [id number]) as of 03/14/2016 18:58  Ref. Range 03/14/2016 10:26  Sodium Latest Ref Range: 135 - 145 mmol/L 141  Potassium Latest Ref Range: 3.5 - 5.1 mmol/L 3.7  Chloride Latest Ref Range: 101 - 111 mmol/L 111  CO2 Latest Ref Range: 22 - 32 mmol/L 25  BUN Latest Ref Range: 6 - 20 mg/dL 12  Creatinine Latest Ref Range: 0.44 -  1.00 mg/dL 1.15 (H)  Calcium Latest Ref Range: 8.9 - 10.3 mg/dL 8.8 (L)  EGFR (Non-African Amer.) Latest Ref Range: >60 mL/min 51 (L)  EGFR (African American) Latest Ref Range: >60 mL/min 60 (L)  Glucose Latest Ref Range: 65 - 99 mg/dL 84  Anion gap Latest Ref Range: 5 - 15  5  Alkaline Phosphatase Latest Ref Range: 38 - 126 U/L 129 (H)  Albumin Latest Ref Range: 3.5 - 5.0 g/dL 4.1  AST Latest Ref Range: 15 - 41 U/L 32  ALT Latest Ref Range: 14 - 54 U/L 30  Total Protein Latest Ref Range: 6.5 - 8.1 g/dL 6.2 (L)  Total Bilirubin Latest Ref Range: 0.3 - 1.2 mg/dL 0.9  LDH Latest Ref Range: 98 - 192 U/L 215 (H)  WBC Latest Ref Range: 4.0 - 10.5 K/uL 2.7 (L)  RBC Latest Ref Range: 3.87 - 5.11 MIL/uL 3.09 (L)  Hemoglobin Latest Ref Range: 12.0 - 15.0 g/dL 11.1 (L)  HCT Latest Ref Range: 36.0 - 46.0 % 31.1 (L)  MCV Latest Ref Range: 78.0 - 100.0 fL 100.6 (H)  MCH Latest Ref Range: 26.0 - 34.0 pg 35.9 (H)  MCHC Latest Ref Range: 30.0 - 36.0 g/dL 35.7  RDW Latest Ref Range: 11.5 - 15.5 % 13.4  Platelets Latest Ref Range: 150 - 400 K/uL 141 (L)  Neutrophils Latest Units: % 65  Lymphocytes Latest Units: % 23  Monocytes Relative Latest Units: % 12  Eosinophil Latest Units: % 0  Basophil Latest Units: % 0  NEUT# Latest Ref Range: 1.7 - 7.7 K/uL 1.7  Lymphocyte # Latest Ref Range: 0.7 - 4.0 K/uL 0.6 (L)  Monocyte # Latest Ref Range: 0.1 - 1.0 K/uL 0.3  Eosinophils Absolute Latest Ref Range: 0.0 - 0.7 K/uL 0.0  Basophils Absolute Latest Ref Range: 0.0 - 0.1 K/uL 0.0   RADIOLOGY: I have personally reviewed the radiological images as listed and agreed with the findings in the report. Study Result     EXAM: DUAL X-RAY ABSORPTIOMETRY (DXA) FOR BONE MINERAL DENSITY  IMPRESSION: Ordering Physician: Dr. Patrici Ranks,  Your patient Tyshell Broz completed a BMD test on 04/13/2015 using the [place] (software version: 14.10) manufactured by [employer].  The following summarizes the results of our evaluation.  PATIENT BIOGRAPHICAL: Name: SHAKEETA, GODETTE Patient ID: [id number] Birth Date: [birth year] Height: 64.0 in. Gender: Female Exam Date: 04/13/2015 Weight: 166.0 lbs.  Indications: Caucasian, Follow up Osteopenia, Post Menopausal, Secondary Osteoporosis Fractures: Treatments: Calcium, Multivitamin, Vitamin  D  DENSITOMETRY RESULTS: Site Region Measured Date Measured Age WHO Classification Young Adult T-score BMD %Change vs. Previous Significant Change (*)  AP Spine L1-L3 04/13/2015 57.9 Normal -0.1 1.153 g/cm2  DualFemur Neck Left 04/13/2015 57.9 Osteopenia -2.0 0.765 g/cm2  ASSESSMENT: BMD as determined from Femur Neck Left is 0.765 g/cm2 with a T-Score of -2.0. This patient is considered osteopenic according to [facility]) criteria. (L-4 was excluded due to advanced degenerative changes.)  World Health Organization [facility]) criteria for post-menopausal, Caucasian Women: Normal: T-score at or above -1 SD Osteopenia: T-score between -1 and -2.5 SD Osteoporosis: T-score at or below -2.5 SD  RECOMMENDATIONS: [place] recommends that FDA-approved medial therapies be considered in postmenopausal women and men age 58 or older with a: 1. Hip or vertebral (clinical or morphometric) fracture. 2. T-Score of < -2.5 at the spine or hip. 3. Ten-year fracture probability by FRAX of 3% or greater for hip fracture or 20% or greater for major osteoporotic fracture.  All treatment decisions require clinical judgment and consideration of individual patient factors, including patient preferences, co-morbidities, previous drug use, risk factors not captured in the FRAX model (e.g. falls, vitamin D deficiency, increased bone turnover, interval significant decline in bone density) and possible under-or over-estimation of fracture risk by FRAX.  All patients  should ensure an adequate intake of dietary calcium (1200 mg/d) and vitamin D (800 IU daily) unless contraindicated.  FOLLOW-UP: People with diagnosed cases of osteoporosis or osteopenia should be regularly tested for bone mineral density. For patients eligible for Medicare, routine testing is allowed once every 2 years. Testing frequency can be increased for patients who have rapidly progressing disease, or for those who are receiving medical therapy to restore bone mass.  Your patient Ananiah S Margraf completed a FRAX assessment on 04/13/2015 using the [place] (analysis version: 14.10) manufactured by [employer]. The following summarizes the results of our evaluation.  PATIENT BIOGRAPHICAL: Name: ZOILA, DITULLIO Patient ID: [id number] Birth Date: November 04, 1955 Height: 64.0 in. Gender: Female [age] Weight: 166.0 lbs. Ethnicity: White Exam Date: 04/13/2015  FRAX* RESULTS: (version: 3.5) 10-year Probability of Fracture1 Major Osteoporotic Fracture2 Hip Fracture 8.6% 1.0% Population: [place] (Caucasian) Risk Factors: Secondary Osteoporosis  Based on Femur (Left) Neck BMD  1 -The 10-year probability of fracture may be lower than reported if the patient has received treatment. 2 -Major Osteoporotic Fracture: Clinical Spine, Forearm, Hip or Shoulder  *FRAX is a Materials engineer of the [employer] for Metabolic Bone Disease, a [place] (WHO) [facility].  ASSESSMENT: The probability of a major osteoporotic fracture is 8.6% within the next ten years.  The probability of a hip fracture is 1.0% within the next ten years.   Electronically Signed  By: Earle Gell M.D.  On: 04/13/2015 10:31    ASSESSMENT & PLAN:   Coombs positive hemolytic anemia, remission Steroid responsive HIV Left lower extremity swelling Persistently  low haptoglobin Mild thrombocytopenia Macrocytosis, normal B12, folate 10/2015   H/H have fallen slightly. I discussed this with the patient. She is agreeable to repeat labs in the next week to week and 1/2.  Haptoglobin remains persistently low yet LDH is stable. She may have a low grade hemolysis but would continue with observation, nothing needs to be treated currently. Thrombocytopenia is mild and stable. She does not have known liver disease.    I have strongly encouraged her to come in for lab work if she develops significant  fatigue or concern that her hemolysis has recurred. I have added standing orders for her and given her Jennifer's contact information if she ever is concerned about relapse to avoid confusion or delays in obtaining labs in the future.   She will return in 4 months for follow up.  I will keep her apprised of repeat lab results in the next week.   Orders Placed This Encounter  Procedures  . CBC with Differential    Standing Status:   Future    Standing Expiration Date:   03/14/2017  . Reticulocytes    Standing Status:   Future    Standing Expiration Date:   03/14/2017  . CBC with Differential    Standing Status:   Future    Standing Expiration Date:   07/14/2016  . Comprehensive metabolic panel    Standing Status:   Future    Standing Expiration Date:   07/14/2016  . Lactate dehydrogenase    Standing Status:   Future    Standing Expiration Date:   07/14/2016  . Reticulocytes    Standing Status:   Future    Standing Expiration Date:   07/14/2016  . Haptoglobin    Standing Status:   Future    Standing Expiration Date:   07/14/2016  . Bilirubin, total    Standing Status:   Future    Standing Expiration Date:   03/14/2017    All questions were answered. The patient knows to call the clinic with any problems, questions or concerns  This document serves as a record of services personally performed by Ancil Linsey, MD. It was created on her behalf by  Arlyce Harman, a trained medical scribe. The creation of this record is based on the scribe's personal observations and the provider's statements to them. This document has been checked and approved by the attending provider.  I have reviewed the above documentation for accuracy and completeness, and I agree with the above.  This note was electronically signed. Molli Hazard, MD  03/14/2016 6:57 PM

## 2016-03-14 ENCOUNTER — Encounter (HOSPITAL_COMMUNITY): Payer: Federal, State, Local not specified - PPO

## 2016-03-14 ENCOUNTER — Encounter (HOSPITAL_COMMUNITY): Payer: Self-pay | Admitting: Hematology & Oncology

## 2016-03-14 ENCOUNTER — Encounter (HOSPITAL_COMMUNITY): Payer: Federal, State, Local not specified - PPO | Attending: Hematology & Oncology | Admitting: Hematology & Oncology

## 2016-03-14 VITALS — BP 108/54 | HR 58 | Temp 98.3°F | Resp 16 | Wt 171.0 lb

## 2016-03-14 DIAGNOSIS — B20 Human immunodeficiency virus [HIV] disease: Secondary | ICD-10-CM | POA: Insufficient documentation

## 2016-03-14 DIAGNOSIS — D591 Other autoimmune hemolytic anemias: Secondary | ICD-10-CM | POA: Diagnosis not present

## 2016-03-14 DIAGNOSIS — D696 Thrombocytopenia, unspecified: Secondary | ICD-10-CM

## 2016-03-14 DIAGNOSIS — D7589 Other specified diseases of blood and blood-forming organs: Secondary | ICD-10-CM | POA: Diagnosis not present

## 2016-03-14 DIAGNOSIS — D5919 Other autoimmune hemolytic anemia: Secondary | ICD-10-CM

## 2016-03-14 DIAGNOSIS — E8809 Other disorders of plasma-protein metabolism, not elsewhere classified: Secondary | ICD-10-CM

## 2016-03-14 LAB — COMPREHENSIVE METABOLIC PANEL
ALBUMIN: 4.1 g/dL (ref 3.5–5.0)
ALK PHOS: 129 U/L — AB (ref 38–126)
ALT: 30 U/L (ref 14–54)
AST: 32 U/L (ref 15–41)
Anion gap: 5 (ref 5–15)
BILIRUBIN TOTAL: 0.9 mg/dL (ref 0.3–1.2)
BUN: 12 mg/dL (ref 6–20)
CALCIUM: 8.8 mg/dL — AB (ref 8.9–10.3)
CO2: 25 mmol/L (ref 22–32)
Chloride: 111 mmol/L (ref 101–111)
Creatinine, Ser: 1.15 mg/dL — ABNORMAL HIGH (ref 0.44–1.00)
GFR calc Af Amer: 60 mL/min — ABNORMAL LOW (ref >60)
GFR calc non Af Amer: 51 mL/min — ABNORMAL LOW (ref >60)
GLUCOSE: 84 mg/dL (ref 65–99)
POTASSIUM: 3.7 mmol/L (ref 3.5–5.1)
SODIUM: 141 mmol/L (ref 135–145)
TOTAL PROTEIN: 6.2 g/dL — AB (ref 6.5–8.1)

## 2016-03-14 LAB — CBC WITH DIFFERENTIAL/PLATELET
BASOS ABS: 0 10*3/uL (ref 0.0–0.1)
BASOS PCT: 0 %
Eosinophils Absolute: 0 10*3/uL (ref 0.0–0.7)
Eosinophils Relative: 0 %
HEMATOCRIT: 31.1 % — AB (ref 36.0–46.0)
HEMOGLOBIN: 11.1 g/dL — AB (ref 12.0–15.0)
Lymphocytes Relative: 23 %
Lymphs Abs: 0.6 10*3/uL — ABNORMAL LOW (ref 0.7–4.0)
MCH: 35.9 pg — ABNORMAL HIGH (ref 26.0–34.0)
MCHC: 35.7 g/dL (ref 30.0–36.0)
MCV: 100.6 fL — ABNORMAL HIGH (ref 78.0–100.0)
Monocytes Absolute: 0.3 10*3/uL (ref 0.1–1.0)
Monocytes Relative: 12 %
NEUTROS ABS: 1.7 10*3/uL (ref 1.7–7.7)
NEUTROS PCT: 65 %
Platelets: 141 10*3/uL — ABNORMAL LOW (ref 150–400)
RBC: 3.09 MIL/uL — ABNORMAL LOW (ref 3.87–5.11)
RDW: 13.4 % (ref 11.5–15.5)
WBC: 2.7 10*3/uL — AB (ref 4.0–10.5)

## 2016-03-14 LAB — LACTATE DEHYDROGENASE: LDH: 215 U/L — ABNORMAL HIGH (ref 98–192)

## 2016-03-14 NOTE — Patient Instructions (Signed)
[facility] at [facility] Discharge Instructions  RECOMMENDATIONS MADE BY THE CONSULTANT AND ANY TEST RESULTS WILL BE SENT TO YOUR REFERRING PHYSICIAN.  You were seen by Dr. Whitney Muse today. Labs in 2 weeks Anderson Malta, the navigator will contact you RTC 4 months with labs.  Thank you for choosing [place] at [facility] to provide your oncology and hematology care.  To afford each patient quality time with our provider, please arrive at least 15 minutes before your scheduled appointment time.   Beginning January 23rd 2017 lab work for the [facility] will be done in the  Main lab at [facility] on 1st floor. If you have a lab appointment with the [facility] please come in thru the  Main Entrance and check in at the main information desk  You need to re-schedule your appointment should you arrive 10 or more minutes late.  We strive to give you quality time with our providers, and arriving late affects you and other patients whose appointments are after yours.  Also, if you no show three or more times for appointments you may be dismissed from the clinic at the providers discretion.     Again, thank you for choosing [facility].  Our hope is that these requests will decrease the amount of time that you wait before being seen by our physicians.       _____________________________________________________________  Should you have questions after your visit to [facility], please contact our office at [phone] between the hours of 8:30 a.m. and 4:30 p.m.  Voicemails left after 4:30 p.m. will not be returned until the following business day.  For prescription refill requests, have your pharmacy contact our office.         Resources For Cancer Patients and their Caregivers ? American Cancer Society: Can assist with transportation, wigs, general needs, runs Look Good Feel Better.        814-547-0308 ? Cancer  Care: Provides financial assistance, online support groups, medication/co-pay assistance.  1-800-813-HOPE (815)657-7658) ? [place] Assists [place] cancer patients and their families through emotional , educational and financial support.  (252)442-2811 ? [place] DSS Where to apply for food stamps, Medicaid and utility assistance. 6042984578 ? [facility]: Transportation to medical appointments. (319) 036-9500 ? Social Security Administration: May apply for disability if have a Stage IV cancer. 704-859-1572 425-336-9238 ? [employer], Disability and Transit Services: Assists with nutrition, care and transit needs. [place] Support Programs: @10RELATIVEDAYS @ > Cancer Support Group  2nd Tuesday of the month 1pm-2pm, Journey Room  > Creative Journey  3rd Tuesday of the month 1130am-1pm, Journey Room  > Look Good Feel Better  1st Wednesday of the month 10am-12 noon, Journey Room (Call [place] to register [phone])

## 2016-03-15 LAB — HAPTOGLOBIN: Haptoglobin: <10 mg/dL — ABNORMAL LOW (ref 34–200)

## 2016-03-29 ENCOUNTER — Encounter (HOSPITAL_COMMUNITY): Payer: Federal, State, Local not specified - PPO | Attending: Hematology & Oncology

## 2016-03-29 DIAGNOSIS — B20 Human immunodeficiency virus [HIV] disease: Secondary | ICD-10-CM

## 2016-03-29 DIAGNOSIS — D591 Other autoimmune hemolytic anemias: Secondary | ICD-10-CM | POA: Insufficient documentation

## 2016-03-29 DIAGNOSIS — D5919 Other autoimmune hemolytic anemia: Secondary | ICD-10-CM

## 2016-03-29 LAB — CBC WITH DIFFERENTIAL/PLATELET
BASOS ABS: 0 10*3/uL (ref 0.0–0.1)
Basophils Relative: 0 %
EOS ABS: 0 10*3/uL (ref 0.0–0.7)
EOS PCT: 0 %
HCT: 31.9 % — ABNORMAL LOW (ref 36.0–46.0)
Hemoglobin: 11.2 g/dL — ABNORMAL LOW (ref 12.0–15.0)
LYMPHS ABS: 0.8 10*3/uL (ref 0.7–4.0)
Lymphocytes Relative: 28 %
MCH: 35.4 pg — AB (ref 26.0–34.0)
MCHC: 35.1 g/dL (ref 30.0–36.0)
MCV: 100.9 fL — ABNORMAL HIGH (ref 78.0–100.0)
MONO ABS: 0.3 10*3/uL (ref 0.1–1.0)
Monocytes Relative: 10 %
Neutro Abs: 1.8 10*3/uL (ref 1.7–7.7)
Neutrophils Relative %: 62 %
PLATELETS: 155 10*3/uL (ref 150–400)
RBC: 3.16 MIL/uL — AB (ref 3.87–5.11)
RDW: 13.4 % (ref 11.5–15.5)
WBC: 3 10*3/uL — AB (ref 4.0–10.5)

## 2016-03-29 LAB — COMPREHENSIVE METABOLIC PANEL
ALT: 20 U/L (ref 14–54)
ANION GAP: 8 (ref 5–15)
AST: 25 U/L (ref 15–41)
Albumin: 4.5 g/dL (ref 3.5–5.0)
Alkaline Phosphatase: 138 U/L — ABNORMAL HIGH (ref 38–126)
BUN: 13 mg/dL (ref 6–20)
CHLORIDE: 107 mmol/L (ref 101–111)
CO2: 26 mmol/L (ref 22–32)
CREATININE: 1.2 mg/dL — AB (ref 0.44–1.00)
Calcium: 9.4 mg/dL (ref 8.9–10.3)
GFR, EST AFRICAN AMERICAN: 57 mL/min — AB (ref >60)
GFR, EST NON AFRICAN AMERICAN: 49 mL/min — AB (ref >60)
Glucose, Bld: 85 mg/dL (ref 65–99)
POTASSIUM: 4 mmol/L (ref 3.5–5.1)
SODIUM: 141 mmol/L (ref 135–145)
Total Bilirubin: 0.8 mg/dL (ref 0.3–1.2)
Total Protein: 6.6 g/dL (ref 6.5–8.1)

## 2016-03-29 LAB — RETICULOCYTES
RBC.: 3.16 MIL/uL — AB (ref 3.87–5.11)
RETIC COUNT ABSOLUTE: 116.9 10*3/uL (ref 19.0–186.0)
RETIC CT PCT: 3.7 % — AB (ref 0.4–3.1)

## 2016-03-29 LAB — LACTATE DEHYDROGENASE: LDH: 230 U/L — AB (ref 98–192)

## 2016-03-30 LAB — HAPTOGLOBIN: Haptoglobin: <10 mg/dL — ABNORMAL LOW (ref 34–200)

## 2016-04-06 ENCOUNTER — Other Ambulatory Visit: Payer: Federal, State, Local not specified - PPO

## 2016-04-06 DIAGNOSIS — B20 Human immunodeficiency virus [HIV] disease: Secondary | ICD-10-CM | POA: Diagnosis not present

## 2016-04-06 LAB — COMPREHENSIVE METABOLIC PANEL
ALK PHOS: 133 U/L — AB (ref 33–130)
ALT: 14 U/L (ref 6–29)
AST: 19 U/L (ref 10–35)
Albumin: 4.3 g/dL (ref 3.6–5.1)
BUN: 10 mg/dL (ref 7–25)
CALCIUM: 9.2 mg/dL (ref 8.6–10.4)
CHLORIDE: 110 mmol/L (ref 98–110)
CO2: 27 mmol/L (ref 20–31)
Creat: 1.2 mg/dL — ABNORMAL HIGH (ref 0.50–1.05)
Glucose, Bld: 81 mg/dL (ref 65–99)
POTASSIUM: 3.7 mmol/L (ref 3.5–5.3)
Sodium: 142 mmol/L (ref 135–146)
TOTAL PROTEIN: 5.8 g/dL — AB (ref 6.1–8.1)
Total Bilirubin: 0.7 mg/dL (ref 0.2–1.2)

## 2016-04-06 LAB — CBC
HEMATOCRIT: 30.2 % — AB (ref 35.0–45.0)
Hemoglobin: 10.5 g/dL — ABNORMAL LOW (ref 11.7–15.5)
MCH: 35 pg — AB (ref 27.0–33.0)
MCHC: 34.8 g/dL (ref 32.0–36.0)
MCV: 100.7 fL — AB (ref 80.0–100.0)
MPV: 8.9 fL (ref 7.5–12.5)
Platelets: 173 10*3/uL (ref 140–400)
RBC: 3 MIL/uL — AB (ref 3.80–5.10)
RDW: 13 % (ref 11.0–15.0)
WBC: 2.6 10*3/uL — ABNORMAL LOW (ref 3.8–10.8)

## 2016-04-07 LAB — T-HELPER CELL (CD4) - (RCID CLINIC ONLY)
CD4 T CELL ABS: 260 /uL — AB (ref 400–2700)
CD4 T CELL HELPER: 44 % (ref 33–55)

## 2016-04-07 LAB — HIV-1 RNA QUANT-NO REFLEX-BLD
HIV 1 RNA Quant: <20 copies/mL (ref <20)
HIV-1 RNA Quant, Log: <1.3 Log copies/mL (ref <1.30)

## 2016-04-20 ENCOUNTER — Encounter: Payer: Self-pay | Admitting: Internal Medicine

## 2016-04-20 ENCOUNTER — Ambulatory Visit (INDEPENDENT_AMBULATORY_CARE_PROVIDER_SITE_OTHER): Payer: Federal, State, Local not specified - PPO | Admitting: Internal Medicine

## 2016-04-20 DIAGNOSIS — B20 Human immunodeficiency virus [HIV] disease: Secondary | ICD-10-CM | POA: Diagnosis not present

## 2016-04-20 DIAGNOSIS — G4700 Insomnia, unspecified: Secondary | ICD-10-CM | POA: Diagnosis not present

## 2016-04-20 DIAGNOSIS — Z23 Encounter for immunization: Secondary | ICD-10-CM

## 2016-04-20 MED ORDER — ZOLPIDEM TARTRATE 10 MG PO TABS: ORAL_TABLET | ORAL | 0 refills | Status: DC

## 2016-04-20 NOTE — Progress Notes (Signed)
Patient Active Problem List   Diagnosis Date Noted  . Hemolytic anemia (HCC)     Priority: High  . Renal insufficiency 01/09/2012    Priority: High  . Dyslipidemia 05/11/2011    Priority: High  . Human immunodeficiency virus (HIV) disease ([place]) 10/11/2006    Priority: High  . History of cardiovascular disorder 10/02/2006    Priority: High  . Headache 10/14/2015  . Vertigo 10/14/2015  . Diplopia 10/14/2015  . Hand pain 04/22/2015  . 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 Elevated alkaline phosphatase level 01/14/2013  . Hip pain, left 03/08/2011  . ROTATOR CUFF SYNDROME, RIGHT 07/12/2010  . 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 RECTAL BLEEDING 10/11/2006  . SYMPTOM, ABNORMAL LOSS OF WEIGHT 10/11/2006  . 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 LIPODYSTROPHY 10/02/2006  . PERIPHERAL NEUROPATHY 10/02/2006  . ARTHROSCOPY, KNEE, HX OF 10/02/2006    Patient's Medications  New Prescriptions   No medications on file  Previous Medications   ABACAVIR-DOLUTEGRAVIR-LAMIVUD 600-50-300 MG TABS    Take 1 tablet by mouth daily.   ACETAMINOPHEN (TYLENOL) 500 MG TABLET    Take 500 mg by mouth every 6 (six) hours as needed.   ASPIRIN 81 MG TABLET    Take 81 mg by mouth daily.     ATORVASTATIN (LIPITOR) 10 MG TABLET    Take 10 mg by mouth daily.   BETA CAROTENE W/MINERALS (OCUVITE) TABLET    Take 2 tablets by mouth daily.   CALCIUM CARBONATE (OS-CAL) 600 MG TABS    Take 1,200 mg by mouth daily.    CHOLECALCIFEROL (VITAMIN D-3) 1000 UNITS CAPS    Take 1,000 Units by mouth daily.   CLOPIDOGREL (PLAVIX) 75 MG TABLET    Take 1 tablet (75 mg total) by mouth daily.   DIAZEPAM (VALIUM) 2 MG TABLET    Take 1 tablet (2 mg total) by mouth every 6 (six) hours as needed for vertigo. May try 1/2 pill as well.   OMEGA-3 FATTY ACIDS (FISH OIL) 1000 MG CAPS    Take 1 capsule by mouth daily.    ZOLPIDEM (AMBIEN) 10 MG TABLET    Take 1/2-1 tablet at bedtime as needed for trouble  sleeping  Modified Medications   No medications on file  Discontinued Medications   No medications on file    Subjective: Theresa Morrison is in for her routine HIV follow-up visit. She's had no problems obtaining, tolerating or taking her Triumeq. She takes it each evening. She does not recall missing any doses. She has been a little more tired than usual but believes that is due to her weight. She has had trouble making exercise a priority. She has tried to make some changes in her diet but has trouble sticking with it. She was having problems with vertigo. A brain MRI did not show any acute changes. The vertical has lessened recently.   Review of Systems: Review of Systems  Constitutional: Positive for malaise/fatigue. Negative for chills, diaphoresis, fever and weight loss.  HENT: Negative for sore throat.   Respiratory: Negative for cough, sputum production and shortness of breath.   Cardiovascular: Negative for chest pain.  Gastrointestinal: Negative for abdominal pain, diarrhea, nausea and vomiting.  Genitourinary: Negative for dysuria and frequency.  Musculoskeletal: Negative for joint pain and myalgias.  Skin: Negative for rash.  Neurological: Negative for dizziness and headaches.       As noted in history of present illness.  Psychiatric/Behavioral: Negative for depression and substance abuse. The patient is not nervous/anxious.     Past Medical History:  Diagnosis Date  . Anemia   . Colon polyps    adenomatous  . CVA (cerebral vascular accident) ([place])   . Headache   . HIV positive ([place]) 1987  . Hypercholesterolemia   . Hyperlipidemia   . Internal hemorrhoids   . Shingles   . Toxoplasmosis     Social History  Substance Use Topics  . Smoking status: Never Smoker  . Smokeless tobacco: Never Used  . Alcohol use No    Family History  Problem Relation Age of Onset  . Heart attack Father   . High Cholesterol Father   . Breast cancer Mother     mets to lung and brain    . Cancer Mother   . Prostate cancer Brother   . Stroke Neg Hx   . Migraines Neg Hx   . Neuropathy Neg Hx     Allergies  Allergen Reactions  . Penicillins     REACTION: facial swelling  . Stavudine     REACTION: peripheral neuropathy  . Sulfamethoxazole-Trimethoprim     REACTION: hives    Objective:  Vitals:   04/20/16 1006  BP: 102/68  Pulse: 61  Temp: 98.8 F (37.1 C)  TempSrc: Oral  Weight: 169 lb 8 oz (76.9 kg)   Body mass index is 28.87 kg/m.  Physical Exam  Constitutional: She is oriented to person, place, and time.  She is smiling and in good spirits. Her weight is unchanged.  HENT:  Mouth/Throat: No oropharyngeal exudate.  Eyes: Conjunctivae are normal.  Cardiovascular: Normal rate and regular rhythm.   No murmur heard. Pulmonary/Chest: Effort normal and breath sounds normal. She has no wheezes. She has no rales.  Abdominal: Soft. She exhibits no mass. There is no tenderness.  Musculoskeletal: Normal range of motion.  Neurological: She is alert and oriented to person, place, and time. Gait normal.  Skin: No rash noted.  Psychiatric: Mood and affect normal.    Lab Results Lab Results  Component Value Date   WBC 2.6 (L) 04/06/2016   HGB 10.5 (L) 04/06/2016   HCT 30.2 (L) 04/06/2016   MCV 100.7 (H) 04/06/2016   PLT 173 04/06/2016    Lab Results  Component Value Date   CREATININE 1.20 (H) 04/06/2016   BUN 10 04/06/2016   NA 142 04/06/2016   K 3.7 04/06/2016   CL 110 04/06/2016   CO2 27 04/06/2016    Lab Results  Component Value Date   ALT 14 04/06/2016   AST 19 04/06/2016   ALKPHOS 133 (H) 04/06/2016   BILITOT 0.7 04/06/2016    Lab Results  Component Value Date   CHOL 152 10/05/2015   HDL 50 10/05/2015   LDLCALC 88 10/05/2015   TRIG 68 10/05/2015   CHOLHDL 3.0 10/05/2015   HIV 1 RNA Quant (copies/mL)  Date Value  04/06/2016 <20  10/05/2015 <20  04/08/2015 <20   CD4 T Cell Abs (/uL)  Date Value  04/06/2016 260 (L)   10/05/2015 200 (L)  04/08/2015 340 (L)     Problem List Items Addressed This Visit      High   Human immunodeficiency virus (HIV) disease ([place])    Her infection is under excellent, long-term control. She will continue Triumeq and follow-up after lab work in one year.      Relevant Orders   T-helper cell (CD4)- ([facility] clinic only)   HIV  1 RNA quant-no reflex-bld   CBC   Comprehensive metabolic panel   Lipid panel   RPR    Other Visit Diagnoses   None.       Michel Bickers, MD [facility] [phone] pager   [phone] cell 04/20/2016, 10:38 AM

## 2016-04-20 NOTE — Addendum Note (Signed)
Addended by: Michel Bickers on: 04/20/2016 01:14 PM   Modules accepted: Orders

## 2016-04-20 NOTE — Assessment & Plan Note (Signed)
Her infection is under excellent, long-term control. She will continue Triumeq and follow-up after lab work in one year.

## 2016-05-09 ENCOUNTER — Other Ambulatory Visit (HOSPITAL_COMMUNITY): Payer: Self-pay | Admitting: *Deleted

## 2016-05-09 DIAGNOSIS — B20 Human immunodeficiency virus [HIV] disease: Secondary | ICD-10-CM

## 2016-05-10 ENCOUNTER — Encounter (HOSPITAL_COMMUNITY): Payer: Federal, State, Local not specified - PPO | Attending: Hematology & Oncology

## 2016-05-10 DIAGNOSIS — B20 Human immunodeficiency virus [HIV] disease: Secondary | ICD-10-CM | POA: Diagnosis not present

## 2016-05-10 LAB — RETICULOCYTES
RBC.: 2.83 MIL/uL — AB (ref 3.87–5.11)
RETIC COUNT ABSOLUTE: 133 10*3/uL (ref 19.0–186.0)
Retic Ct Pct: 4.7 % — ABNORMAL HIGH (ref 0.4–3.1)

## 2016-05-10 LAB — COMPREHENSIVE METABOLIC PANEL
ALBUMIN: 4.3 g/dL (ref 3.5–5.0)
ALK PHOS: 122 U/L (ref 38–126)
ALT: 20 U/L (ref 14–54)
ANION GAP: 6 (ref 5–15)
AST: 25 U/L (ref 15–41)
BUN: 11 mg/dL (ref 6–20)
CALCIUM: 9.1 mg/dL (ref 8.9–10.3)
CHLORIDE: 109 mmol/L (ref 101–111)
CO2: 26 mmol/L (ref 22–32)
Creatinine, Ser: 1.18 mg/dL — ABNORMAL HIGH (ref 0.44–1.00)
GFR calc non Af Amer: 50 mL/min — ABNORMAL LOW (ref >60)
GFR, EST AFRICAN AMERICAN: 58 mL/min — AB (ref >60)
GLUCOSE: 91 mg/dL (ref 65–99)
Potassium: 3.9 mmol/L (ref 3.5–5.1)
SODIUM: 141 mmol/L (ref 135–145)
Total Bilirubin: 1 mg/dL (ref 0.3–1.2)
Total Protein: 6.4 g/dL — ABNORMAL LOW (ref 6.5–8.1)

## 2016-05-10 LAB — CBC
HCT: 29.2 % — ABNORMAL LOW (ref 36.0–46.0)
HEMOGLOBIN: 10.2 g/dL — AB (ref 12.0–15.0)
MCH: 36 pg — AB (ref 26.0–34.0)
MCHC: 34.9 g/dL (ref 30.0–36.0)
MCV: 103.2 fL — ABNORMAL HIGH (ref 78.0–100.0)
PLATELETS: 163 10*3/uL (ref 150–400)
RBC: 2.83 MIL/uL — AB (ref 3.87–5.11)
RDW: 13.9 % (ref 11.5–15.5)
WBC: 2.4 10*3/uL — ABNORMAL LOW (ref 4.0–10.5)

## 2016-05-10 LAB — LACTATE DEHYDROGENASE: LDH: 235 U/L — ABNORMAL HIGH (ref 98–192)

## 2016-05-11 LAB — HAPTOGLOBIN

## 2016-05-12 ENCOUNTER — Other Ambulatory Visit: Payer: Self-pay | Admitting: Internal Medicine

## 2016-05-12 DIAGNOSIS — B20 Human immunodeficiency virus [HIV] disease: Secondary | ICD-10-CM

## 2016-05-18 DIAGNOSIS — J069 Acute upper respiratory infection, unspecified: Secondary | ICD-10-CM | POA: Diagnosis not present

## 2016-05-18 DIAGNOSIS — Z6828 Body mass index (BMI) 28.0-28.9, adult: Secondary | ICD-10-CM | POA: Diagnosis not present

## 2016-05-18 DIAGNOSIS — B20 Human immunodeficiency virus [HIV] disease: Secondary | ICD-10-CM | POA: Diagnosis not present

## 2016-05-18 DIAGNOSIS — H6691 Otitis media, unspecified, right ear: Secondary | ICD-10-CM | POA: Diagnosis not present

## 2016-05-18 DIAGNOSIS — Z1389 Encounter for screening for other disorder: Secondary | ICD-10-CM | POA: Diagnosis not present

## 2016-05-25 DIAGNOSIS — Z01419 Encounter for gynecological examination (general) (routine) without abnormal findings: Secondary | ICD-10-CM | POA: Diagnosis not present

## 2016-05-25 DIAGNOSIS — Z6826 Body mass index (BMI) 26.0-26.9, adult: Secondary | ICD-10-CM | POA: Diagnosis not present

## 2016-05-25 DIAGNOSIS — Z1231 Encounter for screening mammogram for malignant neoplasm of breast: Secondary | ICD-10-CM | POA: Diagnosis not present

## 2016-05-29 ENCOUNTER — Other Ambulatory Visit (HOSPITAL_COMMUNITY): Payer: Self-pay

## 2016-05-29 DIAGNOSIS — D591 Other autoimmune hemolytic anemias: Principal | ICD-10-CM

## 2016-05-29 DIAGNOSIS — D5919 Other autoimmune hemolytic anemia: Secondary | ICD-10-CM

## 2016-05-31 ENCOUNTER — Encounter (HOSPITAL_COMMUNITY): Payer: Federal, State, Local not specified - PPO | Attending: Hematology & Oncology

## 2016-05-31 DIAGNOSIS — E7800 Pure hypercholesterolemia, unspecified: Secondary | ICD-10-CM | POA: Diagnosis not present

## 2016-05-31 DIAGNOSIS — Z9889 Other specified postprocedural states: Secondary | ICD-10-CM | POA: Diagnosis not present

## 2016-05-31 DIAGNOSIS — Z803 Family history of malignant neoplasm of breast: Secondary | ICD-10-CM | POA: Diagnosis not present

## 2016-05-31 DIAGNOSIS — Z823 Family history of stroke: Secondary | ICD-10-CM | POA: Insufficient documentation

## 2016-05-31 DIAGNOSIS — Z8042 Family history of malignant neoplasm of prostate: Secondary | ICD-10-CM | POA: Insufficient documentation

## 2016-05-31 DIAGNOSIS — Z86010 Personal history of colonic polyps: Secondary | ICD-10-CM | POA: Insufficient documentation

## 2016-05-31 DIAGNOSIS — Z8673 Personal history of transient ischemic attack (TIA), and cerebral infarction without residual deficits: Secondary | ICD-10-CM | POA: Insufficient documentation

## 2016-05-31 DIAGNOSIS — B20 Human immunodeficiency virus [HIV] disease: Secondary | ICD-10-CM | POA: Insufficient documentation

## 2016-05-31 DIAGNOSIS — E876 Hypokalemia: Secondary | ICD-10-CM | POA: Diagnosis not present

## 2016-05-31 DIAGNOSIS — Z8249 Family history of ischemic heart disease and other diseases of the circulatory system: Secondary | ICD-10-CM | POA: Insufficient documentation

## 2016-05-31 DIAGNOSIS — K648 Other hemorrhoids: Secondary | ICD-10-CM | POA: Insufficient documentation

## 2016-05-31 DIAGNOSIS — D5919 Other autoimmune hemolytic anemia: Secondary | ICD-10-CM

## 2016-05-31 DIAGNOSIS — D591 Other autoimmune hemolytic anemias: Secondary | ICD-10-CM | POA: Diagnosis not present

## 2016-05-31 LAB — CBC WITH DIFFERENTIAL/PLATELET
BASOS PCT: 0 %
Basophils Absolute: 0 10*3/uL (ref 0.0–0.1)
Eosinophils Absolute: 0 10*3/uL (ref 0.0–0.7)
Eosinophils Relative: 0 %
HEMATOCRIT: 28.2 % — AB (ref 36.0–46.0)
HEMOGLOBIN: 9.9 g/dL — AB (ref 12.0–15.0)
LYMPHS PCT: 29 %
Lymphs Abs: 0.9 10*3/uL (ref 0.7–4.0)
MCH: 35.5 pg — ABNORMAL HIGH (ref 26.0–34.0)
MCHC: 35.1 g/dL (ref 30.0–36.0)
MCV: 101.1 fL — ABNORMAL HIGH (ref 78.0–100.0)
MONOS PCT: 9 %
Monocytes Absolute: 0.3 10*3/uL (ref 0.1–1.0)
NEUTROS ABS: 2 10*3/uL (ref 1.7–7.7)
NEUTROS PCT: 62 %
Platelets: 180 10*3/uL (ref 150–400)
RBC: 2.79 MIL/uL — ABNORMAL LOW (ref 3.87–5.11)
RDW: 14.4 % (ref 11.5–15.5)
WBC: 3.2 10*3/uL — ABNORMAL LOW (ref 4.0–10.5)

## 2016-05-31 LAB — RETICULOCYTES
RBC.: 2.79 MIL/uL — AB (ref 3.87–5.11)
RETIC COUNT ABSOLUTE: 136.7 10*3/uL (ref 19.0–186.0)
Retic Ct Pct: 4.9 % — ABNORMAL HIGH (ref 0.4–3.1)

## 2016-05-31 LAB — SAMPLE TO BLOOD BANK

## 2016-05-31 NOTE — Progress Notes (Signed)
CRITICAL VALUE ALERT Critical value received:  Hemoglobin 9.9 Date of notification:  05-31-2016 Time of notification: 1605 Critical value read back:  Yes.   Nurse who received alert:  C.Zollie Clemence RN MD notified (1st [place] Meenach):  [phone]

## 2016-06-02 ENCOUNTER — Telehealth (HOSPITAL_COMMUNITY): Payer: Self-pay

## 2016-06-02 DIAGNOSIS — D5919 Other autoimmune hemolytic anemia: Secondary | ICD-10-CM

## 2016-06-02 DIAGNOSIS — D591 Other autoimmune hemolytic anemias: Principal | ICD-10-CM

## 2016-06-02 MED ORDER — PREDNISONE 20 MG PO TABS: 60.0000 mg | ORAL_TABLET | Freq: Every day | ORAL | 0 refills | Status: DC

## 2016-06-02 NOTE — Telephone Encounter (Signed)
-----   Message from Patrici Ranks, MD sent at 05/31/2016 11:13 AM EST ----- Needs to restart prednisone 60 mg po daily. Repeat labs on Monday. Advise her we can discuss rituxan. Have her follow-up with me or tom next week. Dr.P

## 2016-06-02 NOTE — Telephone Encounter (Signed)
Notified patient to start prednisone. New prescription sent to patients pharmacy. Labs and follow up with Tom on 06/09/16. Patient verbalized understanding.

## 2016-06-08 ENCOUNTER — Other Ambulatory Visit: Payer: Self-pay | Admitting: Obstetrics and Gynecology

## 2016-06-08 DIAGNOSIS — Z803 Family history of malignant neoplasm of breast: Secondary | ICD-10-CM

## 2016-06-09 ENCOUNTER — Encounter (HOSPITAL_BASED_OUTPATIENT_CLINIC_OR_DEPARTMENT_OTHER): Payer: Federal, State, Local not specified - PPO | Admitting: Oncology

## 2016-06-09 ENCOUNTER — Encounter (HOSPITAL_COMMUNITY): Payer: Self-pay | Admitting: Oncology

## 2016-06-09 ENCOUNTER — Encounter (HOSPITAL_COMMUNITY): Payer: Federal, State, Local not specified - PPO

## 2016-06-09 VITALS — BP 106/59 | HR 62 | Temp 98.3°F | Resp 16 | Ht 64.5 in | Wt 166.0 lb

## 2016-06-09 DIAGNOSIS — Z9889 Other specified postprocedural states: Secondary | ICD-10-CM | POA: Diagnosis not present

## 2016-06-09 DIAGNOSIS — B20 Human immunodeficiency virus [HIV] disease: Secondary | ICD-10-CM

## 2016-06-09 DIAGNOSIS — Z8673 Personal history of transient ischemic attack (TIA), and cerebral infarction without residual deficits: Secondary | ICD-10-CM | POA: Diagnosis not present

## 2016-06-09 DIAGNOSIS — Z823 Family history of stroke: Secondary | ICD-10-CM | POA: Diagnosis not present

## 2016-06-09 DIAGNOSIS — Z8249 Family history of ischemic heart disease and other diseases of the circulatory system: Secondary | ICD-10-CM | POA: Diagnosis not present

## 2016-06-09 DIAGNOSIS — Z86010 Personal history of colonic polyps: Secondary | ICD-10-CM | POA: Diagnosis not present

## 2016-06-09 DIAGNOSIS — D591 Other autoimmune hemolytic anemias: Secondary | ICD-10-CM

## 2016-06-09 DIAGNOSIS — D5919 Other autoimmune hemolytic anemia: Secondary | ICD-10-CM

## 2016-06-09 DIAGNOSIS — E7800 Pure hypercholesterolemia, unspecified: Secondary | ICD-10-CM | POA: Diagnosis not present

## 2016-06-09 DIAGNOSIS — E876 Hypokalemia: Secondary | ICD-10-CM | POA: Diagnosis not present

## 2016-06-09 DIAGNOSIS — Z8042 Family history of malignant neoplasm of prostate: Secondary | ICD-10-CM | POA: Diagnosis not present

## 2016-06-09 DIAGNOSIS — K648 Other hemorrhoids: Secondary | ICD-10-CM | POA: Diagnosis not present

## 2016-06-09 DIAGNOSIS — Z803 Family history of malignant neoplasm of breast: Secondary | ICD-10-CM | POA: Diagnosis not present

## 2016-06-09 LAB — CBC WITH DIFFERENTIAL/PLATELET
BASOS ABS: 0 10*3/uL (ref 0.0–0.1)
BASOS PCT: 0 %
EOS ABS: 0 10*3/uL (ref 0.0–0.7)
EOS PCT: 0 %
HCT: 31.6 % — ABNORMAL LOW (ref 36.0–46.0)
HEMOGLOBIN: 11.2 g/dL — AB (ref 12.0–15.0)
LYMPHS ABS: 1.2 10*3/uL (ref 0.7–4.0)
Lymphocytes Relative: 22 %
MCH: 35.9 pg — ABNORMAL HIGH (ref 26.0–34.0)
MCHC: 35.4 g/dL (ref 30.0–36.0)
MCV: 101.3 fL — ABNORMAL HIGH (ref 78.0–100.0)
Monocytes Absolute: 0.5 10*3/uL (ref 0.1–1.0)
Monocytes Relative: 9 %
NEUTROS PCT: 69 %
Neutro Abs: 3.6 10*3/uL (ref 1.7–7.7)
PLATELETS: 160 10*3/uL (ref 150–400)
RBC: 3.12 MIL/uL — AB (ref 3.87–5.11)
RDW: 15.7 % — ABNORMAL HIGH (ref 11.5–15.5)
WBC: 5.2 10*3/uL (ref 4.0–10.5)

## 2016-06-09 LAB — COMPREHENSIVE METABOLIC PANEL
ALBUMIN: 4.3 g/dL (ref 3.5–5.0)
ALK PHOS: 116 U/L (ref 38–126)
ALT: 25 U/L (ref 14–54)
AST: 25 U/L (ref 15–41)
Anion gap: 6 (ref 5–15)
BUN: 16 mg/dL (ref 6–20)
CALCIUM: 9.2 mg/dL (ref 8.9–10.3)
CHLORIDE: 108 mmol/L (ref 101–111)
CO2: 27 mmol/L (ref 22–32)
CREATININE: 1.12 mg/dL — AB (ref 0.44–1.00)
GFR calc non Af Amer: 53 mL/min — ABNORMAL LOW (ref >60)
GLUCOSE: 79 mg/dL (ref 65–99)
Potassium: 3.1 mmol/L — ABNORMAL LOW (ref 3.5–5.1)
SODIUM: 141 mmol/L (ref 135–145)
Total Bilirubin: 0.8 mg/dL (ref 0.3–1.2)
Total Protein: 6.2 g/dL — ABNORMAL LOW (ref 6.5–8.1)

## 2016-06-09 LAB — RETICULOCYTES
RBC.: 3.12 MIL/uL — AB (ref 3.87–5.11)
RETIC CT PCT: 5.4 % — AB (ref 0.4–3.1)
Retic Count, Absolute: 168.5 10*3/uL (ref 19.0–186.0)

## 2016-06-09 LAB — LACTATE DEHYDROGENASE: LDH: 216 U/L — ABNORMAL HIGH (ref 98–192)

## 2016-06-09 MED ORDER — POTASSIUM CHLORIDE CRYS ER 20 MEQ PO TBCR: 20.0000 meq | EXTENDED_RELEASE_TABLET | Freq: Two times a day (BID) | ORAL | 0 refills | Status: DC

## 2016-06-09 NOTE — Progress Notes (Signed)
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 Megan Morrison (ID).   She notes a significant amount of stress in her life as her son is transitioning from female to female.  He is experiencing a lot of push back and hatred from his church family.  As one can imaging, this is placing a lot of stress on the patient.  She is tolerating Prednisone well.  She notes increased jitteriness and anxiety associated with prednisone.  She denies any changes in appetite.  Review of Systems  Constitutional: Negative.  Negative for chills, fever and weight loss.  HENT: Negative.   Eyes: Negative.  Negative for blurred vision.  Respiratory: Negative.  Negative for cough.   Cardiovascular: Negative.  Negative for chest pain.  Gastrointestinal: Negative.  Negative for constipation, diarrhea, nausea and vomiting.  Genitourinary: Negative.   Musculoskeletal: Negative.   Skin: Negative.   Neurological: Negative.  Negative for weakness.  Endo/Heme/Allergies: Negative.   Psychiatric/Behavioral: The patient is nervous/anxious.     Past Medical History:  Diagnosis Date  . Anemia   . Colon polyps    adenomatous  . CVA (cerebral vascular accident) ([place])   . Headache   . HIV positive ([place]) 1987  . Hypercholesterolemia   . Hyperlipidemia   . Internal hemorrhoids   . Shingles   . Toxoplasmosis      Past Surgical History:  Procedure Laterality Date  . APPENDECTOMY  2001  . KNEE ARTHROSCOPY Bilateral   . SHOULDER SURGERY Right   . TONSILLECTOMY      Family History  Problem Relation Age of Onset  . Heart attack Father   . High Cholesterol Father   . Breast cancer Mother     mets to lung and brain  . Cancer Mother   . Prostate cancer Brother   . Stroke Neg Hx   . Migraines Neg Hx   . Neuropathy Neg Hx     Social History   Social History  . Marital status: Married    Spouse name: [name]  . Number of children: 2  . Years of education: 12   Occupational History  .  Unemployed   Social History Main Topics  . Smoking status: Never Smoker  . Smokeless tobacco: Never Used  . Alcohol use No  . Drug use: No  . Sexual activity: Not Currently     Comment: declined condoms   Other Topics Concern  . None   Social History Narrative   Lives with spouse and son   Caffeine use: 12oz daily     PHYSICAL EXAMINATION  ECOG PERFORMANCE STATUS: 0 - Asymptomatic  Vitals:   06/09/16 1104  BP: (!) 106/59  Pulse: 62  Resp: 16  Temp: 98.3 F (36.8 C)    GENERAL:alert, no distress, well nourished, well developed, comfortable, cooperative, obese, smiling and unaccompanied SKIN: skin color, texture, turgor are normal, no rashes or significant lesions HEAD: Normocephalic, No masses, lesions, tenderness or abnormalities EYES: normal, EOMI, Conjunctiva are pink and  non-injected EARS: External ears normal OROPHARYNX:lips, buccal mucosa, and tongue normal and mucous membranes are moist  NECK: supple, trachea midline LYMPH:  no palpable lymphadenopathy BREAST:not examined LUNGS: clear to auscultation  HEART: regular rate & rhythm ABDOMEN:abdomen soft and normal bowel sounds BACK: Back symmetric, no curvature. EXTREMITIES:less then 2 second capillary refill, no joint deformities, effusion, or inflammation, no skin discoloration, no cyanosis  NEURO: alert & oriented x 3  with fluent speech, no focal motor/sensory deficits, gait normal   LABORATORY DATA: CBC    Component Value Date/Time   WBC 5.2 06/09/2016 1011   RBC 3.12 (L) 06/09/2016 1011   RBC 3.12 (L) 06/09/2016 1011   HGB 11.2 (L) 06/09/2016 1011   HCT 31.6 (L) 06/09/2016 1011   PLT 160 06/09/2016 1011   MCV 101.3 (H) 06/09/2016 1011   MCH 35.9 (H) 06/09/2016 1011   MCHC 35.4 06/09/2016 1011   RDW 15.7 (H) 06/09/2016 1011   LYMPHSABS 1.2 06/09/2016 1011   MONOABS 0.5 06/09/2016 1011   EOSABS 0.0 06/09/2016 1011   BASOSABS 0.0 06/09/2016 1011      Chemistry      Component Value Date/Time   NA 141 06/09/2016 1011   K 3.1 (L) 06/09/2016 1011   CL 108 06/09/2016 1011   CO2 27 06/09/2016 1011   BUN 16 06/09/2016 1011   CREATININE 1.12 (H) 06/09/2016 1011   CREATININE 1.20 (H) 04/06/2016 0925      Component Value Date/Time   CALCIUM 9.2 06/09/2016 1011   ALKPHOS 116 06/09/2016 1011   AST 25 06/09/2016 1011   ALT 25 06/09/2016 1011   BILITOT 0.8 06/09/2016 1011        PENDING LABS:   RADIOGRAPHIC STUDIES:  No results found.   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 Megan Morrison (ID).   Recent labs demonstrate a progressive anemia, with low haptoglobin, rising retic count, rising LDH, and generally an increasing bilirubin.  As a result, she was started on high-dose prednisone on 06/02/2016 for relapse of disease.  Labs today: CBC diff, CMET, haptoglobin, LDH, retic count.  I personally reviewed and went over laboratory results with the patient.  The results are noted within this dictation.  Lab improvement is noted.  Hypokalemia is appreciated.  Rx for Kdur 40 mEq daily is escribed for the patient.  Labs every week: CBC diff, CMET, haptoglobin, LDH, retic count.  We discussed treatment for relapsed  disease including Rituxan infusions, weekly x 4.  She is provided the risks, benefits, alternatives, and side effects of Rituxan including, but not limited to, anaphylaxis and death.  For now, given current response, we will continue with Prednisone.  Return in 2 weeks for follow-up.  If labs improved, then we can discuss taper at return appointment.   ORDERS PLACED FOR THIS ENCOUNTER: Orders Placed This Encounter  Procedures  . CBC with Differential  . Comprehensive metabolic panel  . Lactate dehydrogenase  . Reticulocytes  . Haptoglobin  . CBC with Differential  . Comprehensive metabolic panel  . Lactate dehydrogenase  . Reticulocytes  . Haptoglobin    MEDICATIONS PRESCRIBED THIS ENCOUNTER: Meds ordered this encounter  Medications  . potassium chloride SA (K-DUR,KLOR-CON) 20 MEQ tablet    Sig: Take 1 tablet (20 mEq total) by mouth 2 (two) times daily.    Dispense:  60 tablet  Refill:  0    Order Specific Question:   Supervising Provider    Answer:   Patrici Ranks [id number]    THERAPY PLAN:  Prednisone 60 mg daily.  Depending on response, will plan to start taper in 2 weeks.  All questions were answered. The patient knows to call the clinic with any problems, questions or concerns. We can certainly see the patient much sooner if necessary.  Patient and plan discussed with Dr. Ancil Linsey and she is in agreement with the aforementioned.   This note is electronically signed by: Doy Mince 06/09/2016 11:55 AM

## 2016-06-09 NOTE — Assessment & Plan Note (Addendum)
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 Megan Salon (ID).   Recent labs demonstrate a progressive anemia, with low haptoglobin, rising retic count, rising LDH, and generally an increasing bilirubin.  As a result, she was started on high-dose prednisone on 06/02/2016 for relapse of disease.  Labs today: CBC diff, CMET, haptoglobin, LDH, retic count.  I personally reviewed and went over laboratory results with the patient.  The results are noted within this dictation.  Lab improvement is noted.  Hypokalemia is appreciated.  Rx for Kdur 40 mEq daily is escribed for the patient.  Labs every week: CBC diff, CMET, haptoglobin, LDH, retic count.  We discussed treatment for relapsed disease including Rituxan infusions, weekly x 4.  She is provided the risks, benefits, alternatives, and side effects of Rituxan including, but not limited to, anaphylaxis and death.  For now, given current response, we will continue with Prednisone.  Return in 2 weeks for follow-up.  If labs improved, then we can discuss taper at return appointment.

## 2016-06-09 NOTE — Patient Instructions (Addendum)
[place] at [facility] Discharge Instructions  RECOMMENDATIONS MADE BY THE CONSULTANT AND ANY TEST RESULTS WILL BE SENT TO YOUR REFERRING PHYSICIAN.  You were seen today by Kirby Crigler PA-C. Continue taking Prednisone 60mg  daily. Kdur 40 mEq daily sent to your pharmacy. Labs Wednesday afternoon. Return in 2 weeks for labs and follow up appointment.    Thank you for choosing [place] at [facility] to provide your oncology and hematology care.  To afford each patient quality time with our provider, please arrive at least 15 minutes before your scheduled appointment time.   Beginning January 23rd 2017 lab work for the [facility] will be done in the  Main lab at [facility] on 1st floor. If you have a lab appointment with the [facility] please come in thru the  Main Entrance and check in at the main information desk  You need to re-schedule your appointment should you arrive 10 or more minutes late.  We strive to give you quality time with our providers, and arriving late affects you and other patients whose appointments are after yours.  Also, if you no show three or more times for appointments you may be dismissed from the clinic at the providers discretion.     Again, thank you for choosing [facility].  Our hope is that these requests will decrease the amount of time that you wait before being seen by our physicians.       _____________________________________________________________  Should you have questions after your visit to [facility], please contact our office at [phone] between the hours of 8:30 a.m. and 4:30 p.m.  Voicemails left after 4:30 p.m. will not be returned until the following business day.  For prescription refill requests, have your pharmacy contact our office.         Resources For Cancer Patients and their Caregivers ? American Cancer Society: Can assist with  transportation, wigs, general needs, runs Look Good Feel Better.        2763358609 ? Cancer Care: Provides financial assistance, online support groups, medication/co-pay assistance.  1-800-813-HOPE (276) 677-5596) ? [place] Assists [place] cancer patients and their families through emotional , educational and financial support.  (478) 766-7215 ? [place] DSS Where to apply for food stamps, Medicaid and utility assistance. 915-165-3817 ? [facility]: Transportation to medical appointments. 772 268 6470 ? Social Security Administration: May apply for disability if have a Stage IV cancer. 606 243 9950 937-478-6411 ? [employer], Disability and Transit Services: Assists with nutrition, care and transit needs. [place] Support Programs: @10RELATIVEDAYS @ > Cancer Support Group  2nd Tuesday of the month 1pm-2pm, Journey Room  > Creative Journey  3rd Tuesday of the month 1130am-1pm, Journey Room  > Look Good Feel Better  1st Wednesday of the month 10am-12 noon, Journey Room (Call [place] to register [phone])

## 2016-06-10 LAB — HAPTOGLOBIN

## 2016-06-14 ENCOUNTER — Encounter (HOSPITAL_COMMUNITY): Payer: Federal, State, Local not specified - PPO

## 2016-06-14 ENCOUNTER — Other Ambulatory Visit (HOSPITAL_COMMUNITY): Payer: Federal, State, Local not specified - PPO

## 2016-06-14 DIAGNOSIS — E876 Hypokalemia: Secondary | ICD-10-CM | POA: Diagnosis not present

## 2016-06-14 DIAGNOSIS — Z8042 Family history of malignant neoplasm of prostate: Secondary | ICD-10-CM | POA: Diagnosis not present

## 2016-06-14 DIAGNOSIS — Z803 Family history of malignant neoplasm of breast: Secondary | ICD-10-CM | POA: Diagnosis not present

## 2016-06-14 DIAGNOSIS — D591 Other autoimmune hemolytic anemias: Secondary | ICD-10-CM | POA: Diagnosis not present

## 2016-06-14 DIAGNOSIS — D5919 Other autoimmune hemolytic anemia: Secondary | ICD-10-CM

## 2016-06-14 DIAGNOSIS — B20 Human immunodeficiency virus [HIV] disease: Secondary | ICD-10-CM | POA: Diagnosis not present

## 2016-06-14 DIAGNOSIS — Z823 Family history of stroke: Secondary | ICD-10-CM | POA: Diagnosis not present

## 2016-06-14 DIAGNOSIS — Z9889 Other specified postprocedural states: Secondary | ICD-10-CM | POA: Diagnosis not present

## 2016-06-14 DIAGNOSIS — E7800 Pure hypercholesterolemia, unspecified: Secondary | ICD-10-CM | POA: Diagnosis not present

## 2016-06-14 DIAGNOSIS — Z86010 Personal history of colonic polyps: Secondary | ICD-10-CM | POA: Diagnosis not present

## 2016-06-14 DIAGNOSIS — Z8673 Personal history of transient ischemic attack (TIA), and cerebral infarction without residual deficits: Secondary | ICD-10-CM | POA: Diagnosis not present

## 2016-06-14 DIAGNOSIS — Z8249 Family history of ischemic heart disease and other diseases of the circulatory system: Secondary | ICD-10-CM | POA: Diagnosis not present

## 2016-06-14 DIAGNOSIS — K648 Other hemorrhoids: Secondary | ICD-10-CM | POA: Diagnosis not present

## 2016-06-14 LAB — COMPREHENSIVE METABOLIC PANEL
ALBUMIN: 4.1 g/dL (ref 3.5–5.0)
ALK PHOS: 127 U/L — AB (ref 38–126)
ALT: 27 U/L (ref 14–54)
ANION GAP: 7 (ref 5–15)
AST: 17 U/L (ref 15–41)
BUN: 14 mg/dL (ref 6–20)
CALCIUM: 9 mg/dL (ref 8.9–10.3)
CHLORIDE: 107 mmol/L (ref 101–111)
CO2: 26 mmol/L (ref 22–32)
Creatinine, Ser: 1.15 mg/dL — ABNORMAL HIGH (ref 0.44–1.00)
GFR calc Af Amer: 59 mL/min — ABNORMAL LOW (ref >60)
GFR calc non Af Amer: 51 mL/min — ABNORMAL LOW (ref >60)
GLUCOSE: 119 mg/dL — AB (ref 65–99)
POTASSIUM: 4.3 mmol/L (ref 3.5–5.1)
SODIUM: 140 mmol/L (ref 135–145)
Total Bilirubin: 0.7 mg/dL (ref 0.3–1.2)
Total Protein: 5.9 g/dL — ABNORMAL LOW (ref 6.5–8.1)

## 2016-06-14 LAB — RETICULOCYTES
RBC.: 3.07 MIL/uL — AB (ref 3.87–5.11)
RETIC COUNT ABSOLUTE: 101.3 10*3/uL (ref 19.0–186.0)
RETIC CT PCT: 3.3 % — AB (ref 0.4–3.1)

## 2016-06-14 LAB — CBC WITH DIFFERENTIAL/PLATELET
BASOS PCT: 0 %
Basophils Absolute: 0 10*3/uL (ref 0.0–0.1)
EOS ABS: 0 10*3/uL (ref 0.0–0.7)
EOS PCT: 0 %
HCT: 31.4 % — ABNORMAL LOW (ref 36.0–46.0)
HEMOGLOBIN: 11.1 g/dL — AB (ref 12.0–15.0)
Lymphocytes Relative: 8 %
Lymphs Abs: 0.6 10*3/uL — ABNORMAL LOW (ref 0.7–4.0)
MCH: 36.2 pg — ABNORMAL HIGH (ref 26.0–34.0)
MCHC: 35.4 g/dL (ref 30.0–36.0)
MCV: 102.3 fL — ABNORMAL HIGH (ref 78.0–100.0)
MONOS PCT: 2 %
Monocytes Absolute: 0.2 10*3/uL (ref 0.1–1.0)
NEUTROS PCT: 90 %
Neutro Abs: 6.3 10*3/uL (ref 1.7–7.7)
PLATELETS: 146 10*3/uL — AB (ref 150–400)
RBC: 3.07 MIL/uL — AB (ref 3.87–5.11)
RDW: 15.4 % (ref 11.5–15.5)
WBC: 7 10*3/uL (ref 4.0–10.5)

## 2016-06-14 LAB — LACTATE DEHYDROGENASE: LDH: 188 U/L (ref 98–192)

## 2016-06-15 LAB — HAPTOGLOBIN: Haptoglobin: 29 mg/dL — ABNORMAL LOW (ref 34–200)

## 2016-06-20 ENCOUNTER — Telehealth: Payer: Self-pay | Admitting: *Deleted

## 2016-06-20 NOTE — Telephone Encounter (Signed)
Patient left message in triage requesting refill of ambien. Patient is currently taking prednisone, only sleeps 2 hours a night. Please advise. Landis Gandy, RN

## 2016-06-20 NOTE — Telephone Encounter (Signed)
She may have 3 refills of her Ambien

## 2016-06-21 ENCOUNTER — Ambulatory Visit
Admission: RE | Admit: 2016-06-21 | Discharge: 2016-06-21 | Disposition: A | Discharge status: Home / Self Care | Payer: Federal, State, Local not specified - PPO | Source: Ambulatory Visit | Attending: Obstetrics and Gynecology | Admitting: Obstetrics and Gynecology

## 2016-06-21 ENCOUNTER — Other Ambulatory Visit: Payer: Self-pay

## 2016-06-21 ENCOUNTER — Other Ambulatory Visit: Payer: Self-pay | Admitting: *Deleted

## 2016-06-21 DIAGNOSIS — Z803 Family history of malignant neoplasm of breast: Secondary | ICD-10-CM

## 2016-06-21 DIAGNOSIS — N6489 Other specified disorders of breast: Secondary | ICD-10-CM | POA: Diagnosis not present

## 2016-06-21 DIAGNOSIS — G4700 Insomnia, unspecified: Secondary | ICD-10-CM

## 2016-06-21 MED ORDER — GADOBENATE DIMEGLUMINE 529 MG/ML IV SOLN
15.0000 mL | Freq: Once | INTRAVENOUS | Status: AC | PRN
  Administered 2016-06-21: 15 mL via INTRAVENOUS

## 2016-06-21 MED ORDER — ZOLPIDEM TARTRATE 10 MG PO TABS: ORAL_TABLET | ORAL | 3 refills | Status: DC

## 2016-06-21 NOTE — Telephone Encounter (Signed)
Send Ambien script to [facility] on [address].   Laverle Patter, RN

## 2016-06-26 ENCOUNTER — Encounter (HOSPITAL_COMMUNITY): Payer: Federal, State, Local not specified - PPO | Attending: Oncology | Admitting: Oncology

## 2016-06-26 ENCOUNTER — Encounter (HOSPITAL_COMMUNITY): Payer: Self-pay | Admitting: Oncology

## 2016-06-26 ENCOUNTER — Telehealth: Payer: Self-pay | Admitting: Internal Medicine

## 2016-06-26 ENCOUNTER — Encounter (HOSPITAL_COMMUNITY): Payer: Federal, State, Local not specified - PPO

## 2016-06-26 DIAGNOSIS — B20 Human immunodeficiency virus [HIV] disease: Secondary | ICD-10-CM | POA: Diagnosis not present

## 2016-06-26 DIAGNOSIS — Z880 Allergy status to penicillin: Secondary | ICD-10-CM | POA: Diagnosis not present

## 2016-06-26 DIAGNOSIS — Z823 Family history of stroke: Secondary | ICD-10-CM | POA: Diagnosis not present

## 2016-06-26 DIAGNOSIS — D589 Hereditary hemolytic anemia, unspecified: Secondary | ICD-10-CM | POA: Diagnosis not present

## 2016-06-26 DIAGNOSIS — K648 Other hemorrhoids: Secondary | ICD-10-CM | POA: Diagnosis not present

## 2016-06-26 DIAGNOSIS — Z79899 Other long term (current) drug therapy: Secondary | ICD-10-CM | POA: Diagnosis not present

## 2016-06-26 DIAGNOSIS — Z803 Family history of malignant neoplasm of breast: Secondary | ICD-10-CM | POA: Insufficient documentation

## 2016-06-26 DIAGNOSIS — Z8249 Family history of ischemic heart disease and other diseases of the circulatory system: Secondary | ICD-10-CM | POA: Diagnosis not present

## 2016-06-26 DIAGNOSIS — D591 Other autoimmune hemolytic anemias: Secondary | ICD-10-CM | POA: Diagnosis not present

## 2016-06-26 DIAGNOSIS — D5919 Other autoimmune hemolytic anemia: Secondary | ICD-10-CM

## 2016-06-26 DIAGNOSIS — Z7982 Long term (current) use of aspirin: Secondary | ICD-10-CM | POA: Insufficient documentation

## 2016-06-26 DIAGNOSIS — E7800 Pure hypercholesterolemia, unspecified: Secondary | ICD-10-CM | POA: Insufficient documentation

## 2016-06-26 DIAGNOSIS — D696 Thrombocytopenia, unspecified: Secondary | ICD-10-CM | POA: Diagnosis not present

## 2016-06-26 DIAGNOSIS — Z820 Family history of epilepsy and other diseases of the nervous system: Secondary | ICD-10-CM | POA: Insufficient documentation

## 2016-06-26 DIAGNOSIS — Z86010 Personal history of colonic polyps: Secondary | ICD-10-CM | POA: Diagnosis not present

## 2016-06-26 DIAGNOSIS — Z8673 Personal history of transient ischemic attack (TIA), and cerebral infarction without residual deficits: Secondary | ICD-10-CM | POA: Diagnosis not present

## 2016-06-26 DIAGNOSIS — Z9889 Other specified postprocedural states: Secondary | ICD-10-CM | POA: Diagnosis not present

## 2016-06-26 DIAGNOSIS — F15980 Other stimulant use, unspecified with stimulant-induced anxiety disorder: Secondary | ICD-10-CM | POA: Insufficient documentation

## 2016-06-26 DIAGNOSIS — D7589 Other specified diseases of blood and blood-forming organs: Secondary | ICD-10-CM | POA: Insufficient documentation

## 2016-06-26 DIAGNOSIS — Z888 Allergy status to other drugs, medicaments and biological substances status: Secondary | ICD-10-CM | POA: Insufficient documentation

## 2016-06-26 LAB — CBC WITH DIFFERENTIAL/PLATELET
BASOS ABS: 0 10*3/uL (ref 0.0–0.1)
Basophils Relative: 0 %
EOS PCT: 0 %
Eosinophils Absolute: 0 10*3/uL (ref 0.0–0.7)
HEMATOCRIT: 35.5 % — AB (ref 36.0–46.0)
Hemoglobin: 12.4 g/dL (ref 12.0–15.0)
LYMPHS ABS: 0.5 10*3/uL — AB (ref 0.7–4.0)
LYMPHS PCT: 6 %
MCH: 35.8 pg — AB (ref 26.0–34.0)
MCHC: 34.9 g/dL (ref 30.0–36.0)
MCV: 102.6 fL — AB (ref 78.0–100.0)
MONO ABS: 0.4 10*3/uL (ref 0.1–1.0)
MONOS PCT: 5 %
Neutro Abs: 7.8 10*3/uL — ABNORMAL HIGH (ref 1.7–7.7)
Neutrophils Relative %: 89 %
PLATELETS: 131 10*3/uL — AB (ref 150–400)
RBC: 3.46 MIL/uL — ABNORMAL LOW (ref 3.87–5.11)
RDW: 14.3 % (ref 11.5–15.5)
WBC: 8.8 10*3/uL (ref 4.0–10.5)

## 2016-06-26 LAB — RETICULOCYTES
RBC.: 3.46 MIL/uL — ABNORMAL LOW (ref 3.87–5.11)
RETIC COUNT ABSOLUTE: 79.6 10*3/uL (ref 19.0–186.0)
RETIC CT PCT: 2.3 % (ref 0.4–3.1)

## 2016-06-26 LAB — COMPREHENSIVE METABOLIC PANEL
ALT: 31 U/L (ref 14–54)
ANION GAP: 7 (ref 5–15)
AST: 19 U/L (ref 15–41)
Albumin: 4 g/dL (ref 3.5–5.0)
Alkaline Phosphatase: 116 U/L (ref 38–126)
BILIRUBIN TOTAL: 0.9 mg/dL (ref 0.3–1.2)
BUN: 18 mg/dL (ref 6–20)
CHLORIDE: 106 mmol/L (ref 101–111)
CO2: 27 mmol/L (ref 22–32)
Calcium: 9.1 mg/dL (ref 8.9–10.3)
Creatinine, Ser: 1.13 mg/dL — ABNORMAL HIGH (ref 0.44–1.00)
GFR, EST NON AFRICAN AMERICAN: 52 mL/min — AB (ref >60)
Glucose, Bld: 86 mg/dL (ref 65–99)
POTASSIUM: 3.8 mmol/L (ref 3.5–5.1)
Sodium: 140 mmol/L (ref 135–145)
TOTAL PROTEIN: 5.9 g/dL — AB (ref 6.5–8.1)

## 2016-06-26 LAB — LACTATE DEHYDROGENASE: LDH: 200 U/L — AB (ref 98–192)

## 2016-06-26 NOTE — Assessment & Plan Note (Addendum)
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 Megan Salon (ID).   Recent labs demonstrate a progressive anemia, with low haptoglobin, rising retic count, rising LDH, and generally an increasing bilirubin.  As a result, she was started on high-dose prednisone on 06/02/2016 for relapse of disease.  Thus far, she has exhibited a good response to steroids.  Labs today: CBC diff, CMET, haptoglobin, LDH, retic count.  I personally reviewed and went over laboratory results with the patient.  The results are noted within this dictation.  Lab improvement is noted.    Labs every week: CBC diff, CMET, haptoglobin, LDH, retic count.  We discussed treatment for relapsed disease including Rituxan infusions, weekly x 4.  She is provided the risks, benefits, alternatives, and side effects of Rituxan including, but not limited to, anaphylaxis and death.  For now, we will continue with Prednisone 60 mg.  I have sent a message to Dr. Megan Salon (ID) to see if there are any contraindications for Rituxan therapy from an HIV management standpoint.  Return in 2 weeks for follow-up.  If labs improved, then we can discuss taper at return appointment and potentially a start date for Rituxan in an attempt to prolong remission.  Addendum: Dr. Megan Salon responded to my message and reports that there is no contraindication from his standpoint for Rituxan.  Therefore, we will get her in for "chemotherapy teaching" and plan on starting Rituxan within the next 1 week.  Rituxan orders are placed.

## 2016-06-26 NOTE — Telephone Encounter (Signed)
Tom,  Her HIV is well controlled so there is no contraindication to Rituxan and I do not see any potential drug drug interactions. Thanks for letting me know.  Theresa Morrison ===View-only below this line===  ----- Message ----- From: [facility], PA-C Sent: 06/26/2016   1:16 PM To: Michel Bickers, MD  Good afternoon Dr. Megan Salon,  Theresa Morrison is a nice 60 yo white lady white HIV and hemolytic anemia.  I believe either myself for Dr. Whitney Muse has discussed this patient with you in the past.  Anyway, her hemolytic anemia has relapsed and I have started her back on steroids.  She has responded nicely.  I know her HIV is well controlled, but the reason for this message is to see if there is any contraindication from an HIV standpoint to Rituxan therapy in the near future in an attempt to get a longer remission rather than chronic corticosteroid use.  Rituxan would be given weekly x 4.    Thank you in advance  Theresa Crigler, PA-C [facility]

## 2016-06-26 NOTE — Progress Notes (Addendum)
Theresa Morrison, [place] [id number]  Other autoimmune hemolytic anemias [facility]) - Plan: CBC with Differential, Comprehensive metabolic panel, Reticulocytes, Haptoglobin, Lactate dehydrogenase  CURRENT THERAPY: Prednisone 60 mg on 06/02/2016  INTERVAL HISTORY: Theresa Morrison 60 y.o. 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 Megan Salon (ID).   She is tolerating Prednisone well.  She notes increased jitteriness and anxiety associated with prednisone.  She denies any changes in appetite.  She reports that the jitteriness is the biggest issue for her.  Review of Systems  Constitutional: Negative.  Negative for chills, fever and weight loss.  HENT: Negative.   Eyes: Negative.  Negative for blurred vision.  Respiratory: Negative.  Negative for cough.   Cardiovascular: Negative.  Negative for chest pain.  Gastrointestinal: Negative.  Negative for constipation, diarrhea, nausea and vomiting.  Genitourinary: Negative.   Musculoskeletal: Negative.   Skin: Negative.   Neurological: Negative.  Negative for weakness.  Endo/Heme/Allergies: Negative.   Psychiatric/Behavioral: The patient is nervous/anxious (Secondary to Prednisone).     Past Medical History:  Diagnosis Date  . Anemia   . Colon polyps    adenomatous  . CVA (cerebral vascular accident) ([place])   . Headache   . HIV positive ([place]) 1987  . Hypercholesterolemia   . Hyperlipidemia   . Internal hemorrhoids   . Shingles   . Toxoplasmosis     Past Surgical History:  Procedure Laterality Date  . APPENDECTOMY  2001  . KNEE ARTHROSCOPY Bilateral   . SHOULDER SURGERY Right   . TONSILLECTOMY      Family History  Problem Relation Age of Onset  . Heart attack Father   . High Cholesterol Father   . Breast cancer Mother     mets to lung and brain  . Cancer Mother   . Prostate  cancer Brother   . Stroke Neg Hx   . Migraines Neg Hx   . Neuropathy Neg Hx     Social History   Social History  . Marital status: Married    Spouse name: [name]  . Number of children: 2  . Years of education: 12   Occupational History  .  Unemployed   Social History Main Topics  . Smoking status: Never Smoker  . Smokeless tobacco: Never Used  . Alcohol use No  . Drug use: No  . Sexual activity: Not Currently     Comment: declined condoms   Other Topics Concern  . None   Social History Narrative   Lives with spouse and son   Caffeine use: 12oz daily     PHYSICAL EXAMINATION  ECOG PERFORMANCE STATUS: 0 - Asymptomatic  Vitals:   06/26/16 1307  BP: 109/68  Pulse: 66  Resp: 16  Temp: 98.2 F (36.8 C)    GENERAL:alert, no distress, well nourished, well developed, comfortable, cooperative, obese, smiling and unaccompanied SKIN: skin color, texture, turgor are normal, no rashes or significant lesions HEAD: Normocephalic, No masses, lesions, tenderness or abnormalities EYES: normal, EOMI, Conjunctiva are pink and non-injected EARS: External ears normal OROPHARYNX:lips, buccal mucosa, and tongue normal and mucous membranes are moist  NECK: supple, trachea midline LYMPH:  no palpable lymphadenopathy BREAST:not examined LUNGS: clear to auscultation  HEART: regular rate & rhythm ABDOMEN:abdomen soft and normal bowel sounds BACK: Back symmetric, no curvature. EXTREMITIES:less then 2 second capillary refill, no joint deformities,  effusion, or inflammation, no skin discoloration, no cyanosis  NEURO: alert & oriented x 3 with fluent speech, no focal motor/sensory deficits, gait normal   LABORATORY DATA: CBC    Component Value Date/Time   WBC 8.8 06/26/2016 1250   RBC 3.46 (L) 06/26/2016 1250   RBC 3.46 (L) 06/26/2016 1250   HGB 12.4 06/26/2016 1250   HCT 35.5 (L) 06/26/2016 1250   PLT 131 (L) 06/26/2016 1250   MCV 102.6 (H) 06/26/2016 1250   MCH 35.8 (H)  06/26/2016 1250   MCHC 34.9 06/26/2016 1250   RDW 14.3 06/26/2016 1250   LYMPHSABS 0.5 (L) 06/26/2016 1250   MONOABS 0.4 06/26/2016 1250   EOSABS 0.0 06/26/2016 1250   BASOSABS 0.0 06/26/2016 1250      Chemistry      Component Value Date/Time   NA 140 06/26/2016 1250   K 3.8 06/26/2016 1250   CL 106 06/26/2016 1250   CO2 27 06/26/2016 1250   BUN 18 06/26/2016 1250   CREATININE 1.13 (H) 06/26/2016 1250   CREATININE 1.20 (H) 04/06/2016 0925      Component Value Date/Time   CALCIUM 9.1 06/26/2016 1250   ALKPHOS 116 06/26/2016 1250   AST 19 06/26/2016 1250   ALT 31 06/26/2016 1250   BILITOT 0.9 06/26/2016 1250        PENDING LABS:   RADIOGRAPHIC STUDIES:  Mr Breast Bilateral W Wo Contrast  Result Date: 06/21/2016 CLINICAL DATA:  60 year old female with a dense fibroglandular pattern. Family history of breast cancer. The patient's lifetime risk for developing breast cancer is estimated at 28.5%. LABS:  None obtained at the time of imaging. EXAM: BILATERAL BREAST MRI WITH AND WITHOUT CONTRAST TECHNIQUE: Multiplanar, multisequence MR images of both breasts were obtained prior to and following the intravenous administration of 15 ml of MultiHance. THREE-DIMENSIONAL MR IMAGE RENDERING ON INDEPENDENT WORKSTATION: Three-dimensional MR images were rendered by post-processing of the original MR data on an independent workstation. The three-dimensional MR images were interpreted, and findings are reported in the following complete MRI report for this study. Three dimensional images were evaluated at the independent Dyna[employer] workstation COMPARISON:  Previous exam(s). FINDINGS: Breast composition: c. Heterogeneous fibroglandular tissue. Background parenchymal enhancement: Moderate. Right breast: No mass or abnormal enhancement. Left breast: No mass or abnormal enhancement. Lymph nodes: No abnormal appearing lymph nodes. Ancillary findings:  None. IMPRESSION: No abnormal enhancement in either  breast. RECOMMENDATION: Bilateral screening mammogram in November of 2018 is recommended. The American Cancer Society recommends annual MRI and mammography in patients with an estimated lifetime risk of developing breast cancer greater than 20 - 25%, or who are known or suspected to be positive for the breast cancer gene. BI-RADS CATEGORY  1: Negative. Electronically Signed   By: Lillia Mountain M.D.   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 Megan Salon (ID).   Recent labs demonstrate a progressive anemia, with low haptoglobin, rising retic count, rising LDH, and generally an increasing bilirubin.  As a result, she was started on high-dose prednisone on 06/02/2016 for relapse of disease.  Thus far, she has exhibited a good response to steroids.  Labs today: CBC diff, CMET, haptoglobin, LDH, retic count.  I personally reviewed and went over laboratory results with the patient.  The results are noted within this dictation.  Lab improvement is noted.    Labs every week: CBC diff, CMET, haptoglobin, LDH, retic count.  We discussed treatment for relapsed disease including Rituxan infusions, weekly x 4.  She is provided the risks, benefits, alternatives, and side effects of Rituxan including, but not limited to, anaphylaxis and death.  For now, we will continue with Prednisone 60 mg.  I have sent a message to Dr. Megan Salon (ID) to see if there are any contraindications for Rituxan therapy from an HIV management standpoint.  Return in 2 weeks for follow-up.  If labs improved, then we can discuss taper at return appointment and potentially a start date for Rituxan in an attempt to prolong remission.  Addendum: Dr. Megan Salon responded to my message and reports that there is no contraindication from his standpoint  for Rituxan.  Therefore, we will get her in for "chemotherapy teaching" and plan on starting Rituxan within the next 1 week.  Rituxan orders are placed.   ORDERS PLACED FOR THIS ENCOUNTER: Orders Placed This Encounter  Procedures  . CBC with Differential  . Comprehensive metabolic panel  . Reticulocytes  . Haptoglobin  . Lactate dehydrogenase    MEDICATIONS PRESCRIBED THIS ENCOUNTER: No orders of the defined types were placed in this encounter.   THERAPY PLAN:  Prednisone 60 mg daily.  Depending on response, will plan to start taper in 2-3 weeks.  We are considering Rituxan therapy in the near future (after the Holidays).  All questions were answered. The patient knows to call the clinic with any problems, questions or concerns. We can certainly see the patient much sooner if necessary.  Patient and plan discussed with Dr. Ancil Linsey and she is in agreement with the aforementioned.   This note is electronically signed by: Doy Mince 06/27/2016 1:45 PM

## 2016-06-26 NOTE — Patient Instructions (Addendum)
[place] at [facility] Discharge Instructions  RECOMMENDATIONS MADE BY THE CONSULTANT AND ANY TEST RESULTS WILL BE SENT TO YOUR REFERRING PHYSICIAN.  Exam with Robynn Pane, PA.  Please continue your prednisone and labs weekly. Return to the clinic in 2-3 weeks.   Please see Amy as you leave for your appointments.    Thank you for choosing [place] at [facility] to provide your oncology and hematology care.  To afford each patient quality time with our provider, please arrive at least 15 minutes before your scheduled appointment time.   Beginning January 23rd 2017 lab work for the [facility] will be done in the  Main lab at [facility] on 1st floor. If you have a lab appointment with the [facility] please come in thru the  Main Entrance and check in at the main information desk  You need to re-schedule your appointment should you arrive 10 or more minutes late.  We strive to give you quality time with our providers, and arriving late affects you and other patients whose appointments are after yours.  Also, if you no show three or more times for appointments you may be dismissed from the clinic at the providers discretion.     Again, thank you for choosing [facility].  Our hope is that these requests will decrease the amount of time that you wait before being seen by our physicians.       _____________________________________________________________  Should you have questions after your visit to [facility], please contact our office at [phone] between the hours of 8:30 a.m. and 4:30 p.m.  Voicemails left after 4:30 p.m. will not be returned until the following business day.  For prescription refill requests, have your pharmacy contact our office.         Resources For Cancer Patients and their Caregivers ? American Cancer Society: Can assist with transportation, wigs, general needs, runs Look  Good Feel Better.        (660)509-0041 ? Cancer Care: Provides financial assistance, online support groups, medication/co-pay assistance.  1-800-813-HOPE 219-307-5359) ? [place] Assists [place] cancer patients and their families through emotional , educational and financial support.  2313524613 ? [place] DSS Where to apply for food stamps, Medicaid and utility assistance. 419-124-6920 ? [facility]: Transportation to medical appointments. (778)380-8031 ? Social Security Administration: May apply for disability if have a Stage IV cancer. 332-322-0595 (909)066-0810 ? [employer], Disability and Transit Services: Assists with nutrition, care and transit needs. [place] Support Programs: @10RELATIVEDAYS @ > Cancer Support Group  2nd Tuesday of the month 1pm-2pm, Journey Room  > Creative Journey  3rd Tuesday of the month 1130am-1pm, Journey Room  > Look Good Feel Better  1st Wednesday of the month 10am-12 noon, Journey Room (Call [place] to register [phone])

## 2016-06-27 LAB — HAPTOGLOBIN: HAPTOGLOBIN: 61 mg/dL (ref 34–200)

## 2016-06-27 MED ORDER — ONDANSETRON HCL 8 MG PO TABS: 8.0000 mg | ORAL_TABLET | Freq: Three times a day (TID) | ORAL | 2 refills | Status: DC | PRN

## 2016-06-27 MED ORDER — PROCHLORPERAZINE MALEATE 10 MG PO TABS: 10.0000 mg | ORAL_TABLET | Freq: Four times a day (QID) | ORAL | 2 refills | Status: DC | PRN

## 2016-06-27 NOTE — Patient Instructions (Signed)
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  You will have 4 weekly doses of rituxan.  You will receive tylenol and benadryl prior to each treatment of rituxan.   You will see the doctor regularly throughout treatment.  We monitor your lab work prior to every treatment.  You will see the doctor regularly through treatment.    POTENTIAL SIDE EFFECTS OF TREATMENT:  Rituximab (Generic Name) Other Name: Rituxan  About This Drug Rituximab is a monoclonal antibody used to treat cancer. This drug is given in the vein (IV).  Possible Side Effects (More Common) . Bone marrow depression. This is a decrease in the number of white blood cells, red blood cells, and platelets. This may raise your risk of infection, make you tired and weak (fatigue), and raise your risk of bleeding. . Rash-skin irritation, redness or itching (dermatitis) . Flu-like symptoms: fever, headache, muscle and joint aches, and fatigue (low energy, feeling weak) . Infusion-related reactions . Hepatitis B - if you have ever had hepatitis B, the virus may come back during treatment with this drug. Your doctor will test to see if you have ever had hepatitis B prior to your treatment. . Changes in your central nervous system can happen. The central nervous system is made up of your brain and spinal cord. You could feel: extreme tiredness, agitation, confusion, or have: hallucinations (see or hear things that are not there), trouble understanding or speaking, loss of control of your bowels or bladder, eyesight changes, numbness or lack of strength to your arms, legs, face, or body, seizures or coma. If you start to have any of these symptoms let your doctor know right away. . Tumor lysis: This drug may act on the cancer cells very quickly. This may affect how your kidneys work. Your doctor will monitor your kidney function. .  Changes in your liver function. Your doctor will check your liver function as needed. . Nausea and throwing up (vomiting): these symptoms may happen within a few hours after your treatment and may last up to 24 hours. Medicines are available to stop or lessen these side effects. . Loose bowel movements (diarrhea) that may last for a few days . Abdominal pain . Infections . Cough, runny nose . Swelling of your legs, ankles and/or feet or hands . High blood pressure Your doctor will check your blood pressure as needed. . Abnormal heart beat  Possible Side Effects (Less Common) . Shortness of breath . Soreness of the mouth and throat. You may have red areas, white patches, or sores that hurt.  Infusion Reactions Infusion Reactions are the most common side effect linked to use of this drug and can be quite severe. Medicines will be given before you get the drug to lower the severity of this side effect. The infusion reactions are the worse with the first dose of the drug and become less severe with more doses of the drug. While you are getting this drug in your vein (IV), tell your nurse right away if you have any of these symptoms of an allergic reaction: . Trouble catching your breath . Feeling like your tongue or throat are swelling . Feeling your heart beat quickly or in a not normal way (palpitations) . Feeling dizzy or lightheaded . Flushing, itching, rash, and/or hives  Treating Side Effects . Ask your doctor or nurse about medicine to stop or lessen  headache, loose bowel movements (diarrhea), constipation, nausea, throwing up (vomiting), or pain. . If you get a rash do not put anything it unless your doctor or nurse says you may. Keep the area around the rash clean and dry. Ask your doctor for medicine if the rash bothers you. . Drink 6-8 cups of fluids each day unless your doctor has told you to limit your fluid intake due to some other health problem. A cup is 8 ounces of fluid. If you  throw up or have loose bowel movements, you should drink more fluids so that you do not become dehydrated (lack of water in the body from losing too much fluid). . If you are not able to move your bowels, check with your doctor or nurse before you use enemas, laxatives, or suppositories . If you have mouth sores, avoid mouthwash that has alcohol. Also avoid alcohol and smoking because they can bother your mouth and throat. . If you have a nose bleed, sit with your head tipped slightly forward. Apply pressure by lightly pinching the bridge of your nose between your thumb and forefinger. Call your doctor if you feel dizzy or faint or if the bleeding doesn't stop after 10 to 15 minutes  Important Information . After treatment with this drug, vaccination with live viruses should be delayed until the immune system recovers. . Symptoms of abnormal bleeding may be: coughing up blood, throwing up blood (may look like coffee grounds), red or black, tarry bowel movements, blood in urine, abnormally heavy menstrual flow, nosebleeds, or any unusual bleeding. . Symptoms of high blood pressure may be: headache, blurred vision, confusion, chest pain, or a feeling that your heart is beat differently. Marland Kitchen Urinary tract infection. Symptoms may include: . Pain or burning when you pass urine . Feeling like you have to pass urine often, but not much comes out when you do. . Tender or heavy feeling in your lower abdomen . Cloudy urine and/or urine that smells bad. . Pain on one side of your back under your ribs. This is where your kidneys are. . Fever, chills, nausea and/or throwing up  Food and Drug Interactions There are no known interactions of rituximab and any food. This drug may interact with other medicines. Tell your doctor and pharmacist about all the medicines and dietary supplements (vitamins, minerals, herbs and others) that you are taking at this time. The safety and use of dietary supplements and alternative  diets are often not known. Using these might affect your cancer or interfere with your treatment. Until more is known, you should not use dietary supplements or alternative diets without your cancer doctor's help.  When to Call the Doctor Call your doctor or nurse right away if you have any of these symptoms: . Fever of 100.5 F (38 C) higher . Chills . Trouble breathing . Rash with or without itching . Blistering or peeling of skin . Chest pain or symptoms of a heart attack. Most heart attacks involve pain in the center of the chest that lasts more than a few minutes. The pain may go away and come back or it can be constant. It can feel like pressure, squeezing, fullness, or pain. Sometimes pain is felt in one or both arms, the back, neck, jaw, or stomach. If any of these symptoms last 2 minutes, call 911 . Easy bleeding or bruising . Blood in urine or bowel movements . Feeling that your heart is beating in a fast or not normal way (palpitations) .  Nausea that stops you from eating or drinking . Throwing up (vomiting) more than 3 times in one day . Abdominal pain . Loose bowel movements (diarrhea) 4 times in one day or diarrhea with weakness or lightheadedness . No bowel movement in 3 days or if you feel uncomfortable . Feeling dizzy or lightheaded . Changes in your speech or vision    EDUCATIONAL MATERIALS GIVEN AND REVIEWED: Information on rituxan given.   SELF CARE ACTIVITIES WHILE ON CHEMOTHERAPY: Hydration Increase your fluid intake 48 hours prior to treatment and drink at least 8 to 12 cups (64 ounces) of water/decaff beverages per day after treatment. You can still have your cup of coffee or soda but these beverages do not count as part of your 8 to 12 cups that you need to drink daily. No alcohol intake.  Medications Continue taking your normal prescription medication as prescribed.  If you start any new herbal or new supplements please let us know first to make sure it is  safe.  Mouth Care Have teeth cleaned professionally before starting treatment. Keep dentures and partial plates clean. Use soft toothbrush and do not use mouthwashes that contain alcohol. Biotene is a good mouthwash that is available at most pharmacies or may be ordered by calling [phone]. Use warm salt water gargles (1 teaspoon salt per 1 quart warm water) before and after meals and at bedtime. Or you may rinse with 2 tablespoons of three-percent hydrogen peroxide mixed in eight ounces of water. If you are still having problems with your mouth or sores in your mouth please call the clinic. If you need dental work, please let Dr. Whitney Muse know before you go for your appointment so that we can coordinate the best possible time for you in regards to your chemo regimen. You need to also let your dentist know that you are actively taking chemo. We may need to do labs prior to your dental appointment.   Skin Care Always use sunscreen that has not expired and with SPF (Sun Protection Factor) of 50 or higher. Wear hats to protect your head from the sun. Remember to use sunscreen on your hands, ears, face, & feet.  Use good moisturizing lotions such as udder cream, eucerin, or even Vaseline. Some chemotherapies can cause dry skin, color changes in your skin and nails.    . Avoid long, hot showers or baths. . Use gentle, fragrance-free soaps and laundry detergent. . Use moisturizers, preferably creams or ointments rather than lotions because the thicker consistency is better at preventing skin dehydration. Apply the cream or ointment within 15 minutes of showering. Reapply moisturizer at night, and moisturize your hands every time after you wash them.  Hair Loss (if your doctor says your hair will fall out)  . If your doctor says that your hair is likely to fall out, decide before you begin chemo whether you want to wear a wig. You may want to shop before treatment to match your hair color. . Hats,  turbans, and scarves can also camouflage hair loss, although some people prefer to leave their heads uncovered. If you go bare-headed outdoors, be sure to use sunscreen on your scalp. . Cut your hair short. It eases the inconvenience of shedding lots of hair, but it also can reduce the emotional impact of watching your hair fall out. . Don't perm or color your hair during chemotherapy. Those chemical treatments are already damaging to hair and can enhance hair loss. Once your chemo treatments are  done and your hair has grown back, it's OK to resume dyeing or perming hair. With chemotherapy, hair loss is almost always temporary. But when it grows back, it may be a different color or texture. In older adults who still had hair color before chemotherapy, the new growth may be completely gray.  Often, new hair is very fine and soft.  Infection Prevention Please wash your hands for at least 30 seconds using warm soapy water. Handwashing is the #1 way to prevent the spread of germs. Stay away from sick people or people who are getting over a cold. If you develop respiratory systems such as green/yellow mucus production or productive cough or persistent cough let us know and we will see if you need an antibiotic. It is a good idea to keep a pair of gloves on when going into grocery stores/[employer] to decrease your risk of coming into contact with germs on the carts, etc. Carry alcohol hand gel with you at all times and use it frequently if out in public. If your temperature reaches 100.5 or higher please call the clinic and let us know.  If it is after hours or on the weekend please go to the ER if your temperature is over 100.5.  Please have your own personal thermometer at home to use.    Sex and bodily fluids If you are going to have sex, a condom must be used to protect the person that isn't taking chemotherapy. Chemo can decrease your libido (sex drive). For a few days after chemotherapy, chemotherapy can be  excreted through your bodily fluids.  When using the toilet please close the lid and flush the toilet twice.  Do this for a few day after you have had chemotherapy.     Effects of chemotherapy on your sex life Some changes are simple and won't last long. They won't affect your sex life permanently. Sometimes you may feel: . too tired . not strong enough to be very active . sick or sore  . not in the mood . anxious or low Your anxiety might not seem related to sex. For example, you may be worried about the cancer and how your treatment is going. Or you may be worried about money, or about how you family are coping with your illness. These things can cause stress, which can affect your interest in sex. It's important to talk to your partner about how you feel. Remember - the changes to your sex life don't usually last long. There's usually no medical reason to stop having sex during chemo. The drugs won't have any long term physical effects on your performance or enjoyment of sex. Cancer can't be passed on to your partner during sex  Contraception It's important to use reliable contraception during treatment. Avoid getting pregnant while you or your partner are having chemotherapy. This is because the drugs may harm the baby. Sometimes chemotherapy drugs can leave a man or woman infertile.  This means you would not be able to have children in the future. You might want to talk to someone about permanent infertility. It can be very difficult to learn that you may no longer be able to have children. Some people find counselling helpful. There might be ways to preserve your fertility, although this is easier for men than for women. You may want to speak to a fertility expert. You can talk about sperm banking or harvesting your eggs. You can also ask about other fertility options, such as donor  eggs. If you have or have had breast cancer, your doctor might advise you not to take the contraceptive pill.  This is because the hormones in it might affect the cancer.  It is not known for sure whether or not chemotherapy drugs can be passed on through semen or secretions from the vagina. Because of this some doctors advise people to use a barrier method if you have sex during treatment. This applies to vaginal, anal or oral sex. Generally, doctors advise a barrier method only for the time you are actually having the treatment and for about a week after your treatment. Advice like this can be worrying, but this does not mean that you have to avoid being intimate with your partner. You can still have close contact with your partner and continue to enjoy sex.  Animals If you have cats or birds we just ask that you not change the litter or change the cage.  Please have someone else do this for you while you are on chemotherapy.   Food Safety During and After Cancer Treatment Food safety is important for people both during and after cancer treatment. Cancer and cancer treatments, such as chemotherapy, radiation therapy, and stem cell/bone marrow transplantation, often weaken the immune system. This makes it harder for your body to protect itself from foodborne illness, also called food poisoning. Foodborne illness is caused by eating food that contains harmful bacteria, parasites, or viruses.  Foods to avoid Some foods have a higher risk of becoming tainted with bacteria. These include: Marland Kitchen Unwashed fresh fruit and vegetables, especially leafy vegetables that can hide dirt and other contaminants . Raw sprouts, such as alfalfa sprouts . Raw or undercooked beef, especially ground beef, or other raw or undercooked meat and poultry . Fatty, fried, or spicy foods immediately before or after treatment.  These can sit heavy on your stomach and make you feel nauseous. . Raw or undercooked shellfish, such as oysters. . Sushi and sashimi, which often contain raw fish.  . Unpasteurized beverages, such as unpasteurized  fruit juices, raw milk, raw yogurt, or cider . Undercooked eggs, such as soft boiled, over easy, and poached; raw, unpasteurized eggs; or foods made with raw egg, such as homemade raw cookie dough and homemade mayonnaise Simple steps for food safety Shop smart. . Do not buy food stored or displayed in an unclean area. . Do not buy bruised or damaged fruits or vegetables. . Do not buy cans that have cracks, dents, or bulges. . Pick up foods that can spoil at the end of your shopping trip and store them in a cooler on the way home. Prepare and clean up foods carefully. . Rinse all fresh fruits and vegetables under running water, and dry them with a clean towel or paper towel. . Clean the top of cans before opening them. . After preparing food, wash your hands for 20 seconds with hot water and soap. Pay special attention to areas between fingers and under nails. . Clean your utensils and dishes with hot water and soap. Marland Kitchen Disinfect your kitchen and cutting boards using 1 teaspoon of liquid, unscented bleach mixed into 1 quart of water.   Dispose of old food. . Eat canned and packaged food before its expiration date (the "use by" or "best before" date). . Consume refrigerated leftovers within 3 to 4 days. After that time, throw out the food. Even if the food does not smell or look spoiled, it still may be unsafe. Some bacteria, such  as Listeria, can grow even on foods stored in the refrigerator if they are kept for too long. Take precautions when eating out. . At restaurants, avoid buffets and salad bars where food sits out for a long time and comes in contact with many people. Food can become contaminated when someone with a virus, often a norovirus, or another "bug" handles it. . Put any leftover food in a "to-go" container yourself, rather than having the server do it. And, refrigerate leftovers as soon as you get home. . Choose restaurants that are clean and that are willing to prepare your food  as you order it cooked.     MEDICATIONS:                                                                                                                                                              Zofran/Ondansetron 8mg  tablet. Take 1 tablet every 8 hours as needed for nausea/vomiting. (#1 nausea med to take, this can constipate)  Compazine/Prochlorperazine 10mg  tablet. Take 1 tablet every 6 hours as needed for nausea/vomiting. (#2 nausea med to take, this can make you sleepy)   EMLA cream. Apply a quarter size amount to port site 1 hour prior to chemo. Do not rub in. Cover with plastic wrap.   Over-the-Counter Meds:  Miralax 1 capful in 8 oz of fluid daily. May increase to two times a day if needed. This is a stool softener. If this doesn't work proceed you can add:  Senokot S-start with 1 tablet two times a day and increase to 4 tablets two times a day if needed. (total of 8 tablets in a 24 hour period). This is a stimulant laxative.   Call us if this does not help your bowels move.   Imodium 2mg  capsule. Take 2 capsules after the 1st loose stool and then 1 capsule every 2 hours until you go a total of 12 hours without having a loose stool. Call the [facility] if loose stools continue. If diarrhea occurs @ bedtime, take 2 capsules @ bedtime. Then take 2 capsules every 4 hours until morning. Call [facility].    Diarrhea Sheet  If you are having loose stools/diarrhea, please purchase Imodium and begin taking as outlined:  At the first sign of poorly formed or loose stools you should begin taking Imodium(loperamide) 2 mg capsules.  Take two caplets (4mg ) followed by one caplet (2mg ) every 2 hours until you have had no diarrhea for 12 hours.  During the night take two caplets (4mg ) at bedtime and continue every 4 hours during the night until the morning.  Stop taking Imodium only after there is no sign of diarrhea for 12 hours.    Always call the [facility] if you are having  loose stools/diarrhea that  you can't get under control.  Loose stools/disrrhea leads to dehydration (loss of water) in your body.  We have other options of trying to get the loose stools/diarrhea to stopped but you must let us know!     Constipation Sheet *Miralax in 8 oz of fluid daily.  May increase to two times a day if needed.  This is a stool softener.  If this not enough to keep your bowel regular:  You can add:  *Senokot S, start with one tablet twice a day and can increase to 4 tablets twice a day if needed.  This is a stimulant laxative.   Sometimes when you take pain medication you need BOTH a medicine to keep your stool soft and a medicine to help your bowel push it out!  Please call if the above does not work for you.   Do not go more than 2 days without a bowel movement.  It is very important that you do not become constipated.  It will make you feel sick to your stomach (nausea) and can cause abdominal pain and vomiting.     Nausea Sheet  Zofran/Ondansetron 8mg  tablet. Take 1 tablet every 8 hours as needed for nausea/vomiting. (#1 nausea med to take, this can constipate)  Compazine/Prochlorperazine 10mg  tablet. Take 1 tablet every 6 hours as needed for nausea/vomiting. (#2 nausea med to take, this can make you sleepy)  You can take these medications together or separately.  We would first like for you to try the Ondansetron by itself and then take the Prochloperizine if needed. But you are allowed to take both medications at the same time if your nausea is that severe.  If you are having persistent nausea (nausea that does not stop) please take these medications on a staggered schedule so that the nausea medication stays in your body.  Please call the [facility] and let us know the amount of nausea that you are experiencing.  If you begin to vomit, you need to call the [facility] and if it is the weekend and you have vomited more than one time and cant get it to stop-go  to the Emergency Room.  Persistent nausea/vomiting can lead to dehydration (loss of fluid in your body) and will make you feel terrible.   Ice chips, sips of clear liquids, foods that are @ room temperature, crackers, and toast tend to be better tolerated.     SYMPTOMS TO REPORT AS SOON AS POSSIBLE AFTER TREATMENT:  FEVER GREATER THAN 100.5 F  CHILLS WITH OR WITHOUT FEVER  NAUSEA AND VOMITING THAT IS NOT CONTROLLED WITH YOUR NAUSEA MEDICATION  UNUSUAL SHORTNESS OF BREATH  UNUSUAL BRUISING OR BLEEDING  TENDERNESS IN MOUTH AND THROAT WITH OR WITHOUT PRESENCE OF ULCERS  URINARY PROBLEMS  BOWEL PROBLEMS  UNUSUAL RASH    Wear comfortable clothing and clothing appropriate for easy access to any Portacath or PICC line. Let us know if there is anything that we can do to make your therapy better!    What to do if you need assistance after hours or on the weekends: CALL [phone].  HOLD on the line, do not hang up.  You will hear multiple messages but at the end you will be connected with a nurse triage line.  They will contact Dr Whitney Muse if necessary.  Most of the time they will be able to assist you.   Do not call the hospital operator.  Dr Whitney Muse will not answer phone calls received by them.  I have been informed and understand all of the instructions given to me and have received a copy. I have been instructed to call the clinic [phone] or my family physician as soon as possible for continued medical care, if indicated. I do not have any more questions at this time but understand that I may call the [facility] or the Patient Navigator at [phone] during office hours should I have questions or need assistance in obtaining follow-up care.

## 2016-06-28 ENCOUNTER — Ambulatory Visit (HOSPITAL_COMMUNITY): Payer: Federal, State, Local not specified - PPO

## 2016-06-28 NOTE — Patient Instructions (Signed)
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  We are going to treat you with Rituxan.  This will be given weekly for 4 weeks.  This is giving to help try to control your hemolytic anemia.  You will receive tylenol and benadryl as premedications prior to receiving rituxan to help prevent a reaction.    POTENTIAL SIDE EFFECTS OF TREATMENT:  Rituximab (Generic Name) Other Name: Rituxan  About This Drug Rituximab is a monoclonal antibody used to treat cancer. This drug is given in the vein (IV).  Possible Side Effects (More Common) . Bone marrow depression. This is a decrease in the number of white blood cells, red blood cells, and platelets. This may raise your risk of infection, make you tired and weak (fatigue), and raise your risk of bleeding. . Rash-skin irritation, redness or itching (dermatitis) . Flu-like symptoms: fever, headache, muscle and joint aches, and fatigue (low energy, feeling weak) . Infusion-related reactions . Hepatitis B - if you have ever had hepatitis B, the virus may come back during treatment with this drug. Your doctor will test to see if you have ever had hepatitis B prior to your treatment. . Changes in your central nervous system can happen. The central nervous system is made up of your brain and spinal cord. You could feel: extreme tiredness, agitation, confusion, or have: hallucinations (see or hear things that are not there), trouble understanding or speaking, loss of control of your bowels or bladder, eyesight changes, numbness or lack of strength to your arms, legs, face, or body, seizures or coma. If you start to have any of these symptoms let your doctor know right away. . Tumor lysis: This drug may act on the cancer cells very quickly. This may affect how your kidneys work. Your doctor will monitor your kidney function. . Changes in your liver function. Your doctor will check your liver  function as needed. . Nausea and throwing up (vomiting): these symptoms may happen within a few hours after your treatment and may last up to 24 hours. Medicines are available to stop or lessen these side effects. . Loose bowel movements (diarrhea) that may last for a few days . Abdominal pain . Infections . Cough, runny nose . Swelling of your legs, ankles and/or feet or hands . High blood pressure Your doctor will check your blood pressure as needed. . Abnormal heart beat  Possible Side Effects (Less Common) . Shortness of breath . Soreness of the mouth and throat. You may have red areas, white patches, or sores that hurt.  Infusion Reactions Infusion Reactions are the most common side effect linked to use of this drug and can be quite severe. Medicines will be given before you get the drug to lower the severity of this side effect. The infusion reactions are the worse with the first dose of the drug and become less severe with more doses of the drug. While you are getting this drug in your vein (IV), tell your nurse right away if you have any of these symptoms of an allergic reaction: . Trouble catching your breath . Feeling like your tongue or throat are swelling . Feeling your heart beat quickly or in a not normal way (palpitations) . Feeling dizzy or lightheaded . Flushing, itching, rash, and/or hives  Treating Side Effects . Ask your doctor or nurse about medicine to stop or lessen headache, loose bowel movements (diarrhea), constipation, nausea, throwing up (vomiting), or pain. Marland Kitchen  If you get a rash do not put anything it unless your doctor or nurse says you may. Keep the area around the rash clean and dry. Ask your doctor for medicine if the rash bothers you. . Drink 6-8 cups of fluids each day unless your doctor has told you to limit your fluid intake due to some other health problem. A cup is 8 ounces of fluid. If you throw up or have loose bowel movements, you should drink more  fluids so that you do not become dehydrated (lack of water in the body from losing too much fluid). . If you are not able to move your bowels, check with your doctor or nurse before you use enemas, laxatives, or suppositories . If you have mouth sores, avoid mouthwash that has alcohol. Also avoid alcohol and smoking because they can bother your mouth and throat. . If you have a nose bleed, sit with your head tipped slightly forward. Apply pressure by lightly pinching the bridge of your nose between your thumb and forefinger. Call your doctor if you feel dizzy or faint or if the bleeding doesn't stop after 10 to 15 minutes  Important Information . After treatment with this drug, vaccination with live viruses should be delayed until the immune system recovers. . Symptoms of abnormal bleeding may be: coughing up blood, throwing up blood (may look like coffee grounds), red or black, tarry bowel movements, blood in urine, abnormally heavy menstrual flow, nosebleeds, or any unusual bleeding. . Symptoms of high blood pressure may be: headache, blurred vision, confusion, chest pain, or a feeling that your heart is beat differently. Marland Kitchen Urinary tract infection. Symptoms may include: . Pain or burning when you pass urine . Feeling like you have to pass urine often, but not much comes out when you do. . Tender or heavy feeling in your lower abdomen . Cloudy urine and/or urine that smells bad. . Pain on one side of your back under your ribs. This is where your kidneys are. . Fever, chills, nausea and/or throwing up  Food and Drug Interactions There are no known interactions of rituximab and any food. This drug may interact with other medicines. Tell your doctor and pharmacist about all the medicines and dietary supplements (vitamins, minerals, herbs and others) that you are taking at this time. The safety and use of dietary supplements and alternative diets are often not known. Using these might affect your  cancer or interfere with your treatment. Until more is known, you should not use dietary supplements or alternative diets without your cancer doctor's help.  When to Call the Doctor Call your doctor or nurse right away if you have any of these symptoms: . Fever of 100.5 F (38 C) higher . Chills . Trouble breathing . Rash with or without itching . Blistering or peeling of skin . Chest pain or symptoms of a heart attack. Most heart attacks involve pain in the center of the chest that lasts more than a few minutes. The pain may go away and come back or it can be constant. It can feel like pressure, squeezing, fullness, or pain. Sometimes pain is felt in one or both arms, the back, neck, jaw, or stomach. If any of these symptoms last 2 minutes, call 911 . Easy bleeding or bruising . Blood in urine or bowel movements . Feeling that your heart is beating in a fast or not normal way (palpitations) . Nausea that stops you from eating or drinking . Throwing up (vomiting)  more than 3 times in one day . Abdominal pain . Loose bowel movements (diarrhea) 4 times in one day or diarrhea with weakness or lightheadedness . No bowel movement in 3 days or if you feel uncomfortable . Feeling dizzy or lightheaded . Changes in your speech or vision    EDUCATIONAL MATERIALS GIVEN AND REVIEWED: Information on rituxan given.   SELF CARE ACTIVITIES WHILE ON CHEMOTHERAPY: Hydration Increase your fluid intake 48 hours prior to treatment and drink at least 8 to 12 cups (64 ounces) of water/decaff beverages per day after treatment. You can still have your cup of coffee or soda but these beverages do not count as part of your 8 to 12 cups that you need to drink daily. No alcohol intake.  Medications Continue taking your normal prescription medication as prescribed.  If you start any new herbal or new supplements please let us know first to make sure it is safe.  Mouth Care Have teeth cleaned professionally  before starting treatment. Keep dentures and partial plates clean. Use soft toothbrush and do not use mouthwashes that contain alcohol. Biotene is a good mouthwash that is available at most pharmacies or may be ordered by calling [phone]. Use warm salt water gargles (1 teaspoon salt per 1 quart warm water) before and after meals and at bedtime. Or you may rinse with 2 tablespoons of three-percent hydrogen peroxide mixed in eight ounces of water. If you are still having problems with your mouth or sores in your mouth please call the clinic. If you need dental work, please let Dr. Whitney Muse know before you go for your appointment so that we can coordinate the best possible time for you in regards to your chemo regimen. You need to also let your dentist know that you are actively taking chemo. We may need to do labs prior to your dental appointment.   Skin Care Always use sunscreen that has not expired and with SPF (Sun Protection Factor) of 50 or higher. Wear hats to protect your head from the sun. Remember to use sunscreen on your hands, ears, face, & feet.  Use good moisturizing lotions such as udder cream, eucerin, or even Vaseline. Some chemotherapies can cause dry skin, color changes in your skin and nails.    . Avoid long, hot showers or baths. . Use gentle, fragrance-free soaps and laundry detergent. . Use moisturizers, preferably creams or ointments rather than lotions because the thicker consistency is better at preventing skin dehydration. Apply the cream or ointment within 15 minutes of showering. Reapply moisturizer at night, and moisturize your hands every time after you wash them.  Hair Loss (if your doctor says your hair will fall out)  . If your doctor says that your hair is likely to fall out, decide before you begin chemo whether you want to wear a wig. You may want to shop before treatment to match your hair color. . Hats, turbans, and scarves can also camouflage hair loss,  although some people prefer to leave their heads uncovered. If you go bare-headed outdoors, be sure to use sunscreen on your scalp. . Cut your hair short. It eases the inconvenience of shedding lots of hair, but it also can reduce the emotional impact of watching your hair fall out. . Don't perm or color your hair during chemotherapy. Those chemical treatments are already damaging to hair and can enhance hair loss. Once your chemo treatments are done and your hair has grown back, it's OK to resume dyeing  or perming hair. With chemotherapy, hair loss is almost always temporary. But when it grows back, it may be a different color or texture. In older adults who still had hair color before chemotherapy, the new growth may be completely gray.  Often, new hair is very fine and soft.  Infection Prevention Please wash your hands for at least 30 seconds using warm soapy water. Handwashing is the #1 way to prevent the spread of germs. Stay away from sick people or people who are getting over a cold. If you develop respiratory systems such as green/yellow mucus production or productive cough or persistent cough let us know and we will see if you need an antibiotic. It is a good idea to keep a pair of gloves on when going into grocery stores/[employer] to decrease your risk of coming into contact with germs on the carts, etc. Carry alcohol hand gel with you at all times and use it frequently if out in public. If your temperature reaches 100.5 or higher please call the clinic and let us know.  If it is after hours or on the weekend please go to the ER if your temperature is over 100.5.  Please have your own personal thermometer at home to use.    Sex and bodily fluids If you are going to have sex, a condom must be used to protect the person that isn't taking chemotherapy. Chemo can decrease your libido (sex drive). For a few days after chemotherapy, chemotherapy can be excreted through your bodily fluids.  When using the  toilet please close the lid and flush the toilet twice.  Do this for a few day after you have had chemotherapy.     Effects of chemotherapy on your sex life Some changes are simple and won't last long. They won't affect your sex life permanently. Sometimes you may feel: . too tired . not strong enough to be very active . sick or sore  . not in the mood . anxious or low Your anxiety might not seem related to sex. For example, you may be worried about the cancer and how your treatment is going. Or you may be worried about money, or about how you family are coping with your illness. These things can cause stress, which can affect your interest in sex. It's important to talk to your partner about how you feel. Remember - the changes to your sex life don't usually last long. There's usually no medical reason to stop having sex during chemo. The drugs won't have any long term physical effects on your performance or enjoyment of sex. Cancer can't be passed on to your partner during sex  Contraception It's important to use reliable contraception during treatment. Avoid getting pregnant while you or your partner are having chemotherapy. This is because the drugs may harm the baby. Sometimes chemotherapy drugs can leave a man or woman infertile.  This means you would not be able to have children in the future. You might want to talk to someone about permanent infertility. It can be very difficult to learn that you may no longer be able to have children. Some people find counselling helpful. There might be ways to preserve your fertility, although this is easier for men than for women. You may want to speak to a fertility expert. You can talk about sperm banking or harvesting your eggs. You can also ask about other fertility options, such as donor eggs. If you have or have had breast cancer, your doctor might  advise you not to take the contraceptive pill. This is because the hormones in it might affect the  cancer.  It is not known for sure whether or not chemotherapy drugs can be passed on through semen or secretions from the vagina. Because of this some doctors advise people to use a barrier method if you have sex during treatment. This applies to vaginal, anal or oral sex. Generally, doctors advise a barrier method only for the time you are actually having the treatment and for about a week after your treatment. Advice like this can be worrying, but this does not mean that you have to avoid being intimate with your partner. You can still have close contact with your partner and continue to enjoy sex.  Animals If you have cats or birds we just ask that you not change the litter or change the cage.  Please have someone else do this for you while you are on chemotherapy.   Food Safety During and After Cancer Treatment Food safety is important for people both during and after cancer treatment. Cancer and cancer treatments, such as chemotherapy, radiation therapy, and stem cell/bone marrow transplantation, often weaken the immune system. This makes it harder for your body to protect itself from foodborne illness, also called food poisoning. Foodborne illness is caused by eating food that contains harmful bacteria, parasites, or viruses.  Foods to avoid Some foods have a higher risk of becoming tainted with bacteria. These include: Marland Kitchen Unwashed fresh fruit and vegetables, especially leafy vegetables that can hide dirt and other contaminants . Raw sprouts, such as alfalfa sprouts . Raw or undercooked beef, especially ground beef, or other raw or undercooked meat and poultry . Fatty, fried, or spicy foods immediately before or after treatment.  These can sit heavy on your stomach and make you feel nauseous. . Raw or undercooked shellfish, such as oysters. . Sushi and sashimi, which often contain raw fish.  . Unpasteurized beverages, such as unpasteurized fruit juices, raw milk, raw yogurt, or  cider . Undercooked eggs, such as soft boiled, over easy, and poached; raw, unpasteurized eggs; or foods made with raw egg, such as homemade raw cookie dough and homemade mayonnaise Simple steps for food safety Shop smart. . Do not buy food stored or displayed in an unclean area. . Do not buy bruised or damaged fruits or vegetables. . Do not buy cans that have cracks, dents, or bulges. . Pick up foods that can spoil at the end of your shopping trip and store them in a cooler on the way home. Prepare and clean up foods carefully. . Rinse all fresh fruits and vegetables under running water, and dry them with a clean towel or paper towel. . Clean the top of cans before opening them. . After preparing food, wash your hands for 20 seconds with hot water and soap. Pay special attention to areas between fingers and under nails. . Clean your utensils and dishes with hot water and soap. Marland Kitchen Disinfect your kitchen and cutting boards using 1 teaspoon of liquid, unscented bleach mixed into 1 quart of water.   Dispose of old food. . Eat canned and packaged food before its expiration date (the "use by" or "best before" date). . Consume refrigerated leftovers within 3 to 4 days. After that time, throw out the food. Even if the food does not smell or look spoiled, it still may be unsafe. Some bacteria, such as Listeria, can grow even on foods stored in the refrigerator if  they are kept for too long. Take precautions when eating out. . At restaurants, avoid buffets and salad bars where food sits out for a long time and comes in contact with many people. Food can become contaminated when someone with a virus, often a norovirus, or another "bug" handles it. . Put any leftover food in a "to-go" container yourself, rather than having the server do it. And, refrigerate leftovers as soon as you get home. . Choose restaurants that are clean and that are willing to prepare your food as you order it  cooked.     MEDICATIONS:                                                                                                                                 Zofran/Ondansetron 8mg  tablet. Take 1 tablet every 8 hours as needed for nausea/vomiting. (#1 nausea med to take, this can constipate)  Compazine/Prochlorperazine 10mg  tablet. Take 1 tablet every 6 hours as needed for nausea/vomiting. (#2 nausea med to take, this can make you sleepy)  Over-the-Counter Meds:  Miralax 1 capful in 8 oz of fluid daily. May increase to two times a day if needed. This is a stool softener. If this doesn't work proceed you can add:  Senokot S-start with 1 tablet two times a day and increase to 4 tablets two times a day if needed. (total of 8 tablets in a 24 hour period). This is a stimulant laxative.   Call us if this does not help your bowels move.   Imodium 2mg  capsule. Take 2 capsules after the 1st loose stool and then 1 capsule every 2 hours until you go a total of 12 hours without having a loose stool. Call the [facility] if loose stools continue. If diarrhea occurs @ bedtime, take 2 capsules @ bedtime. Then take 2 capsules every 4 hours until morning. Call [facility].    Diarrhea Sheet  If you are having loose stools/diarrhea, please purchase Imodium and begin taking as outlined:  At the first sign of poorly formed or loose stools you should begin taking Imodium(loperamide) 2 mg capsules.  Take two caplets (4mg ) followed by one caplet (2mg ) every 2 hours until you have had no diarrhea for 12 hours.  During the night take two caplets (4mg ) at bedtime and continue every 4 hours during the night until the morning.  Stop taking Imodium only after there is no sign of diarrhea for 12 hours.    Always call the [facility] if you are having loose stools/diarrhea that you can't get under control.  Loose stools/disrrhea leads to dehydration (loss of water) in your body.  We have other options of trying to get  the loose stools/diarrhea to stopped but you must let us know!     Constipation Sheet *Miralax in 8 oz of fluid daily.  May increase to two times a day if needed.  This is a stool softener.  If this not enough to keep your bowel regular:  You can add:  *Senokot S, start with one tablet twice a day and can increase to 4 tablets twice a day if needed.  This is a stimulant laxative.   Sometimes when you take pain medication you need BOTH a medicine to keep your stool soft and a medicine to help your bowel push it out!  Please call if the above does not work for you.   Do not go more than 2 days without a bowel movement.  It is very important that you do not become constipated.  It will make you feel sick to your stomach (nausea) and can cause abdominal pain and vomiting.     Nausea Sheet  Zofran/Ondansetron 8mg  tablet. Take 1 tablet every 8 hours as needed for nausea/vomiting. (#1 nausea med to take, this can constipate)  Compazine/Prochlorperazine 10mg  tablet. Take 1 tablet every 6 hours as needed for nausea/vomiting. (#2 nausea med to take, this can make you sleepy)  You can take these medications together or separately.  We would first like for you to try the Ondansetron by itself and then take the Prochloperizine if needed. But you are allowed to take both medications at the same time if your nausea is that severe.  If you are having persistent nausea (nausea that does not stop) please take these medications on a staggered schedule so that the nausea medication stays in your body.  Please call the [facility] and let us know the amount of nausea that you are experiencing.  If you begin to vomit, you need to call the [facility] and if it is the weekend and you have vomited more than one time and cant get it to stop-go to the Emergency Room.  Persistent nausea/vomiting can lead to dehydration (loss of fluid in your body) and will make you feel terrible.   Ice chips, sips of clear  liquids, foods that are @ room temperature, crackers, and toast tend to be better tolerated.     SYMPTOMS TO REPORT AS SOON AS POSSIBLE AFTER TREATMENT:  FEVER GREATER THAN 100.5 F  CHILLS WITH OR WITHOUT FEVER  NAUSEA AND VOMITING THAT IS NOT CONTROLLED WITH YOUR NAUSEA MEDICATION  UNUSUAL SHORTNESS OF BREATH  UNUSUAL BRUISING OR BLEEDING  TENDERNESS IN MOUTH AND THROAT WITH OR WITHOUT PRESENCE OF ULCERS  URINARY PROBLEMS  BOWEL PROBLEMS  UNUSUAL RASH    Wear comfortable clothing and clothing appropriate for easy access to any Portacath or PICC line. Let us know if there is anything that we can do to make your therapy better!    What to do if you need assistance after hours or on the weekends: CALL [phone].  HOLD on the line, do not hang up.  You will hear multiple messages but at the end you will be connected with a nurse triage line.  They will contact Dr Whitney Muse if necessary.  Most of the time they will be able to assist you.   Do not call the hospital operator.  Dr Whitney Muse will not answer phone calls received by them.     I have been informed and understand all of the instructions given to me and have received a copy. I have been instructed to call the clinic [phone] or my family physician as soon as possible for continued medical care, if indicated. I do not have any more questions at this time but understand that I may call the [facility] or the  Patient Navigator at [phone] during office hours should I have questions or need assistance in obtaining follow-up care.

## 2016-06-29 ENCOUNTER — Encounter (HOSPITAL_COMMUNITY): Payer: Federal, State, Local not specified - PPO

## 2016-06-29 DIAGNOSIS — D591 Other autoimmune hemolytic anemias: Principal | ICD-10-CM

## 2016-06-29 DIAGNOSIS — D589 Hereditary hemolytic anemia, unspecified: Secondary | ICD-10-CM | POA: Diagnosis not present

## 2016-06-29 DIAGNOSIS — D5919 Other autoimmune hemolytic anemia: Secondary | ICD-10-CM

## 2016-06-29 LAB — CBC WITH DIFFERENTIAL/PLATELET
BASOS ABS: 0 10*3/uL (ref 0.0–0.1)
BASOS PCT: 0 %
EOS ABS: 0 10*3/uL (ref 0.0–0.7)
EOS PCT: 0 %
HCT: 36.3 % (ref 36.0–46.0)
Hemoglobin: 12.7 g/dL (ref 12.0–15.0)
LYMPHS ABS: 0.3 10*3/uL — AB (ref 0.7–4.0)
Lymphocytes Relative: 4 %
MCH: 35.5 pg — AB (ref 26.0–34.0)
MCHC: 35 g/dL (ref 30.0–36.0)
MCV: 101.4 fL — ABNORMAL HIGH (ref 78.0–100.0)
Monocytes Absolute: 0.2 10*3/uL (ref 0.1–1.0)
Monocytes Relative: 3 %
Neutro Abs: 8 10*3/uL — ABNORMAL HIGH (ref 1.7–7.7)
Neutrophils Relative %: 94 %
PLATELETS: 153 10*3/uL (ref 150–400)
RBC: 3.58 MIL/uL — AB (ref 3.87–5.11)
RDW: 13.4 % (ref 11.5–15.5)
WBC: 8.6 10*3/uL (ref 4.0–10.5)

## 2016-06-29 LAB — COMPREHENSIVE METABOLIC PANEL
ALT: 32 U/L (ref 14–54)
ANION GAP: 7 (ref 5–15)
AST: 21 U/L (ref 15–41)
Albumin: 4.1 g/dL (ref 3.5–5.0)
Alkaline Phosphatase: 117 U/L (ref 38–126)
BUN: 16 mg/dL (ref 6–20)
CHLORIDE: 107 mmol/L (ref 101–111)
CO2: 23 mmol/L (ref 22–32)
CREATININE: 1.06 mg/dL — AB (ref 0.44–1.00)
Calcium: 8.9 mg/dL (ref 8.9–10.3)
GFR calc non Af Amer: 56 mL/min — ABNORMAL LOW (ref >60)
Glucose, Bld: 114 mg/dL — ABNORMAL HIGH (ref 65–99)
Potassium: 3.8 mmol/L (ref 3.5–5.1)
SODIUM: 137 mmol/L (ref 135–145)
Total Bilirubin: 0.5 mg/dL (ref 0.3–1.2)
Total Protein: 6.2 g/dL — ABNORMAL LOW (ref 6.5–8.1)

## 2016-06-29 LAB — RETICULOCYTES
RBC.: 3.59 MIL/uL — AB (ref 3.87–5.11)
RETIC COUNT ABSOLUTE: 71.8 10*3/uL (ref 19.0–186.0)
Retic Ct Pct: 2 % (ref 0.4–3.1)

## 2016-06-29 LAB — LACTATE DEHYDROGENASE: LDH: 221 U/L — ABNORMAL HIGH (ref 98–192)

## 2016-06-29 MED ORDER — PREDNISONE 20 MG PO TABS: 60.0000 mg | ORAL_TABLET | Freq: Every day | ORAL | 0 refills | Status: DC

## 2016-06-29 NOTE — Progress Notes (Signed)
Chemotherapy teaching complete.  Consent signed.  Extensive teaching packet given.  

## 2016-06-30 LAB — HEPATITIS B CORE ANTIBODY, IGM: Hep B C IgM: NEGATIVE

## 2016-06-30 LAB — HEPATITIS B SURFACE ANTIGEN: HEP B S AG: NEGATIVE

## 2016-06-30 LAB — HEPATITIS B CORE ANTIBODY, TOTAL: Hep B Core Total Ab: NEGATIVE

## 2016-06-30 LAB — HAPTOGLOBIN: HAPTOGLOBIN: 75 mg/dL (ref 34–200)

## 2016-06-30 LAB — HEPATITIS B SURFACE ANTIBODY,QUALITATIVE: HEP B S AB: NONREACTIVE

## 2016-07-03 ENCOUNTER — Encounter (HOSPITAL_COMMUNITY): Payer: Self-pay

## 2016-07-03 ENCOUNTER — Encounter (HOSPITAL_BASED_OUTPATIENT_CLINIC_OR_DEPARTMENT_OTHER): Payer: Federal, State, Local not specified - PPO

## 2016-07-03 VITALS — BP 115/63 | HR 79 | Temp 98.4°F | Resp 18 | Wt 168.8 lb

## 2016-07-03 DIAGNOSIS — D5919 Other autoimmune hemolytic anemia: Secondary | ICD-10-CM

## 2016-07-03 DIAGNOSIS — D519 Vitamin B12 deficiency anemia, unspecified: Secondary | ICD-10-CM | POA: Diagnosis not present

## 2016-07-03 DIAGNOSIS — D589 Hereditary hemolytic anemia, unspecified: Secondary | ICD-10-CM | POA: Diagnosis not present

## 2016-07-03 DIAGNOSIS — D591 Other autoimmune hemolytic anemias: Principal | ICD-10-CM

## 2016-07-03 DIAGNOSIS — Z5112 Encounter for antineoplastic immunotherapy: Secondary | ICD-10-CM | POA: Diagnosis not present

## 2016-07-03 LAB — CBC WITH DIFFERENTIAL/PLATELET
BASOS ABS: 0 10*3/uL (ref 0.0–0.1)
BASOS PCT: 0 %
Eosinophils Absolute: 0 10*3/uL (ref 0.0–0.7)
Eosinophils Relative: 0 %
HEMATOCRIT: 38.5 % (ref 36.0–46.0)
HEMOGLOBIN: 13.6 g/dL (ref 12.0–15.0)
Lymphocytes Relative: 9 %
Lymphs Abs: 0.6 10*3/uL — ABNORMAL LOW (ref 0.7–4.0)
MCH: 36.2 pg — ABNORMAL HIGH (ref 26.0–34.0)
MCHC: 35.3 g/dL (ref 30.0–36.0)
MCV: 102.4 fL — ABNORMAL HIGH (ref 78.0–100.0)
MONOS PCT: 7 %
Monocytes Absolute: 0.5 10*3/uL (ref 0.1–1.0)
NEUTROS ABS: 5.8 10*3/uL (ref 1.7–7.7)
NEUTROS PCT: 84 %
Platelets: 140 10*3/uL — ABNORMAL LOW (ref 150–400)
RBC: 3.76 MIL/uL — ABNORMAL LOW (ref 3.87–5.11)
RDW: 13.7 % (ref 11.5–15.5)
WBC: 6.9 10*3/uL (ref 4.0–10.5)

## 2016-07-03 LAB — COMPREHENSIVE METABOLIC PANEL
ALBUMIN: 4 g/dL (ref 3.5–5.0)
ALK PHOS: 121 U/L (ref 38–126)
ALT: 37 U/L (ref 14–54)
AST: 24 U/L (ref 15–41)
Anion gap: 7 (ref 5–15)
BILIRUBIN TOTAL: 0.8 mg/dL (ref 0.3–1.2)
BUN: 16 mg/dL (ref 6–20)
CALCIUM: 9.2 mg/dL (ref 8.9–10.3)
CO2: 26 mmol/L (ref 22–32)
CREATININE: 1.04 mg/dL — AB (ref 0.44–1.00)
Chloride: 105 mmol/L (ref 101–111)
GFR calc Af Amer: >60 mL/min (ref >60)
GFR calc non Af Amer: 58 mL/min — ABNORMAL LOW (ref >60)
GLUCOSE: 87 mg/dL (ref 65–99)
Potassium: 3.6 mmol/L (ref 3.5–5.1)
Sodium: 138 mmol/L (ref 135–145)
TOTAL PROTEIN: 6.3 g/dL — AB (ref 6.5–8.1)

## 2016-07-03 LAB — LACTATE DEHYDROGENASE: LDH: 246 U/L — ABNORMAL HIGH (ref 98–192)

## 2016-07-03 LAB — RETICULOCYTES
RBC.: 3.76 MIL/uL — ABNORMAL LOW (ref 3.87–5.11)
Retic Count, Absolute: 82.7 10*3/uL (ref 19.0–186.0)
Retic Ct Pct: 2.2 % (ref 0.4–3.1)

## 2016-07-03 MED ORDER — SODIUM CHLORIDE 0.9 % IV SOLN
Freq: Once | INTRAVENOUS | Status: AC
  Administered 2016-07-03: 10:00:00 via INTRAVENOUS

## 2016-07-03 MED ORDER — DIPHENHYDRAMINE HCL 25 MG PO CAPS
ORAL_CAPSULE | ORAL | Status: AC
  Filled 2016-07-03: qty 2

## 2016-07-03 MED ORDER — DIPHENHYDRAMINE HCL 50 MG/ML IJ SOLN
12.5000 mg | Freq: Once | INTRAMUSCULAR | Status: AC
  Administered 2016-07-03: 12.5 mg via INTRAVENOUS

## 2016-07-03 MED ORDER — ACETAMINOPHEN 325 MG PO TABS
ORAL_TABLET | ORAL | Status: AC
Start: 2016-07-03 — End: 2016-07-03
  Filled 2016-07-03: qty 2

## 2016-07-03 MED ORDER — ACETAMINOPHEN 325 MG PO TABS
650.0000 mg | ORAL_TABLET | Freq: Once | ORAL | Status: AC
  Administered 2016-07-03: 650 mg via ORAL

## 2016-07-03 MED ORDER — DIPHENHYDRAMINE HCL 25 MG PO CAPS
50.0000 mg | ORAL_CAPSULE | Freq: Once | ORAL | Status: AC
  Administered 2016-07-03: 50 mg via ORAL

## 2016-07-03 MED ORDER — DIPHENHYDRAMINE HCL 50 MG/ML IJ SOLN
INTRAMUSCULAR | Status: AC
  Filled 2016-07-03: qty 1

## 2016-07-03 MED ORDER — SODIUM CHLORIDE 0.9 % IV SOLN
375.0000 mg/m2 | Freq: Once | INTRAVENOUS | Status: AC
  Administered 2016-07-03: 700 mg via INTRAVENOUS
  Filled 2016-07-03: qty 50

## 2016-07-03 NOTE — Progress Notes (Signed)
Vitals 30 post start of rituxan, temp 99.3. Informed Tom Sheldon Silvan PA-C. Proceed with treatment. Will observer per protocol.   1150-Patient complains of pressure in chest radiating to jaw. Vitals stable. Patient states no distress,  Rituxan stopped, saline infusing, Tom Kefalas PA-C notified. Will recheck vitals in 46min and will reassess.  Per Kirby Crigler PA-C, will give additional 12.5 mg of IV benadryl, vitals checked and stable , will resume rituxan per protocol.   First Rituxan treatment gvien today per orders. Patient tolerated it well. Patient discharged in stable condition. Follow up as scheduled.

## 2016-07-03 NOTE — Patient Instructions (Signed)
[facility] Discharge Instructions for Patients Receiving Chemotherapy   Beginning January 23rd 2017 lab work for the Cancer Center will be done in the  Main lab at [place] on 1st floor. If you have a lab appointment with the Cancer Center please come in thru the  Main Entrance and check in at the main information desk   Today you received the following chemotherapy agents Rituxan  To help prevent nausea and vomiting after your treatment, we encourage you to take your nausea medication   If you develop nausea and vomiting, or diarrhea that is not controlled by your medication, call the clinic.  The clinic phone number is [phone]. Office hours are Monday-Friday 8:30am-5:00pm.  BELOW ARE SYMPTOMS THAT SHOULD BE REPORTED IMMEDIATELY:  *FEVER GREATER THAN 101.0 F  *CHILLS WITH OR WITHOUT FEVER  NAUSEA AND VOMITING THAT IS NOT CONTROLLED WITH YOUR NAUSEA MEDICATION  *UNUSUAL SHORTNESS OF BREATH  *UNUSUAL BRUISING OR BLEEDING  TENDERNESS IN MOUTH AND THROAT WITH OR WITHOUT PRESENCE OF ULCERS  *URINARY PROBLEMS  *BOWEL PROBLEMS  UNUSUAL RASH Items with * indicate a potential emergency and should be followed up as soon as possible. If you have an emergency after office hours please contact your primary care physician or go to the nearest emergency department.  Please call the clinic during office hours if you have any questions or concerns.   You may also contact the Patient Navigator at [phone] should you have any questions or need assistance in obtaining follow up care.      Resources For Cancer Patients and their Caregivers ? American Cancer Society: Can assist with transportation, wigs, general needs, runs Look Good Feel Better.        1-888-227-6333 ? Cancer Care: Provides financial assistance, online support groups, medication/co-pay assistance.  1-800-813-HOPE (4673) ? [facility] Assists [place] cancer  patients and their families through emotional , educational and financial support.  336-427-4357 ? [place] DSS Where to apply for food stamps, Medicaid and utility assistance. 336-342-1394 ? RCATS: Transportation to medical appointments. 336-347-2287 ? Social Security Administration: May apply for disability if have a Stage IV cancer. 336-342-7796 1-800-772-1213 ? [place] Aging, Disability and Transit Services: Assists with nutrition, care and transit needs. 336-349-2343          

## 2016-07-04 ENCOUNTER — Other Ambulatory Visit (HOSPITAL_COMMUNITY): Payer: Federal, State, Local not specified - PPO

## 2016-07-04 LAB — HAPTOGLOBIN: HAPTOGLOBIN: 64 mg/dL (ref 34–200)

## 2016-07-04 NOTE — Progress Notes (Signed)
24 hour follow up -left message for patient to call us and let us know how she is doing.

## 2016-07-04 NOTE — Progress Notes (Signed)
24 hour follow up-patient reported swelling in hands last night, better today. Some upset stomach this morning but better. Patient able to eat and drink. No N/V

## 2016-07-07 ENCOUNTER — Other Ambulatory Visit (HOSPITAL_COMMUNITY): Payer: Self-pay | Admitting: Oncology

## 2016-07-07 DIAGNOSIS — E876 Hypokalemia: Secondary | ICD-10-CM

## 2016-07-10 ENCOUNTER — Encounter (HOSPITAL_COMMUNITY): Payer: Self-pay | Admitting: Hematology & Oncology

## 2016-07-10 ENCOUNTER — Encounter (HOSPITAL_BASED_OUTPATIENT_CLINIC_OR_DEPARTMENT_OTHER): Payer: Federal, State, Local not specified - PPO | Admitting: Hematology & Oncology

## 2016-07-10 ENCOUNTER — Encounter (HOSPITAL_COMMUNITY): Payer: Federal, State, Local not specified - PPO

## 2016-07-10 VITALS — BP 120/69 | HR 70 | Temp 98.4°F | Resp 16 | Wt 167.9 lb

## 2016-07-10 VITALS — BP 104/60 | HR 78 | Temp 98.2°F | Resp 18

## 2016-07-10 DIAGNOSIS — Z5112 Encounter for antineoplastic immunotherapy: Secondary | ICD-10-CM | POA: Diagnosis not present

## 2016-07-10 DIAGNOSIS — D591 Other autoimmune hemolytic anemias: Secondary | ICD-10-CM | POA: Diagnosis not present

## 2016-07-10 DIAGNOSIS — D5919 Other autoimmune hemolytic anemia: Secondary | ICD-10-CM

## 2016-07-10 DIAGNOSIS — D7589 Other specified diseases of blood and blood-forming organs: Secondary | ICD-10-CM | POA: Diagnosis not present

## 2016-07-10 DIAGNOSIS — D589 Hereditary hemolytic anemia, unspecified: Secondary | ICD-10-CM | POA: Diagnosis not present

## 2016-07-10 DIAGNOSIS — D696 Thrombocytopenia, unspecified: Secondary | ICD-10-CM

## 2016-07-10 DIAGNOSIS — B20 Human immunodeficiency virus [HIV] disease: Secondary | ICD-10-CM | POA: Diagnosis not present

## 2016-07-10 LAB — CBC WITH DIFFERENTIAL/PLATELET
BASOS PCT: 0 %
Basophils Absolute: 0 10*3/uL (ref 0.0–0.1)
EOS ABS: 0 10*3/uL (ref 0.0–0.7)
EOS PCT: 0 %
HCT: 38.3 % (ref 36.0–46.0)
HEMOGLOBIN: 13.5 g/dL (ref 12.0–15.0)
Lymphocytes Relative: 6 %
Lymphs Abs: 0.4 10*3/uL — ABNORMAL LOW (ref 0.7–4.0)
MCH: 36.2 pg — ABNORMAL HIGH (ref 26.0–34.0)
MCHC: 35.2 g/dL (ref 30.0–36.0)
MCV: 102.7 fL — ABNORMAL HIGH (ref 78.0–100.0)
MONO ABS: 0.4 10*3/uL (ref 0.1–1.0)
MONOS PCT: 6 %
NEUTROS PCT: 88 %
Neutro Abs: 6.9 10*3/uL (ref 1.7–7.7)
PLATELETS: 131 10*3/uL — AB (ref 150–400)
RBC: 3.73 MIL/uL — ABNORMAL LOW (ref 3.87–5.11)
RDW: 13.2 % (ref 11.5–15.5)
WBC: 7.8 10*3/uL (ref 4.0–10.5)

## 2016-07-10 MED ORDER — RITUXIMAB CHEMO INJECTION 500 MG/50ML
375.0000 mg/m2 | Freq: Once | INTRAVENOUS | Status: AC
  Administered 2016-07-10: 700 mg via INTRAVENOUS
  Filled 2016-07-10: qty 20

## 2016-07-10 MED ORDER — SODIUM CHLORIDE 0.9% FLUSH
10.0000 mL | INTRAVENOUS | Status: DC | PRN
  Administered 2016-07-10: 10 mL
  Filled 2016-07-10: qty 10

## 2016-07-10 MED ORDER — DIPHENHYDRAMINE HCL 25 MG PO CAPS
ORAL_CAPSULE | ORAL | Status: AC
  Filled 2016-07-10: qty 2

## 2016-07-10 MED ORDER — ACETAMINOPHEN 325 MG PO TABS
650.0000 mg | ORAL_TABLET | Freq: Once | ORAL | Status: AC
  Administered 2016-07-10: 650 mg via ORAL

## 2016-07-10 MED ORDER — SODIUM CHLORIDE 0.9 % IV SOLN
Freq: Once | INTRAVENOUS | Status: AC
  Administered 2016-07-10: 11:00:00 via INTRAVENOUS

## 2016-07-10 MED ORDER — DIPHENHYDRAMINE HCL 25 MG PO CAPS
50.0000 mg | ORAL_CAPSULE | Freq: Once | ORAL | Status: AC
  Administered 2016-07-10: 50 mg via ORAL

## 2016-07-10 MED ORDER — SODIUM CHLORIDE 0.9% FLUSH: 3.0000 mL | INTRAVENOUS | Status: DC | PRN

## 2016-07-10 MED ORDER — HEPARIN SOD (PORK) LOCK FLUSH 100 UNIT/ML IV SOLN: 500.0000 [IU] | Freq: Once | INTRAVENOUS | Status: DC | PRN

## 2016-07-10 MED ORDER — ACETAMINOPHEN 325 MG PO TABS
ORAL_TABLET | ORAL | Status: AC
  Filled 2016-07-10: qty 2

## 2016-07-10 NOTE — Patient Instructions (Signed)
[facility] Discharge Instructions for Patients Receiving Chemotherapy   Beginning January 23rd 2017 lab work for the Cancer Center will be done in the  Main lab at [place] on 1st floor. If you have a lab appointment with the Cancer Center please come in thru the  Main Entrance and check in at the main information desk   Today you received the following chemotherapy agents Rituxan. Follow-up as scheduled. Call clinic for any questions or concerns  To help prevent nausea and vomiting after your treatment, we encourage you to take your nausea medication   If you develop nausea and vomiting, or diarrhea that is not controlled by your medication, call the clinic.  The clinic phone number is [phone]. Office hours are Monday-Friday 8:30am-5:00pm.  BELOW ARE SYMPTOMS THAT SHOULD BE REPORTED IMMEDIATELY:  *FEVER GREATER THAN 101.0 F  *CHILLS WITH OR WITHOUT FEVER  NAUSEA AND VOMITING THAT IS NOT CONTROLLED WITH YOUR NAUSEA MEDICATION  *UNUSUAL SHORTNESS OF BREATH  *UNUSUAL BRUISING OR BLEEDING  TENDERNESS IN MOUTH AND THROAT WITH OR WITHOUT PRESENCE OF ULCERS  *URINARY PROBLEMS  *BOWEL PROBLEMS  UNUSUAL RASH Items with * indicate a potential emergency and should be followed up as soon as possible. If you have an emergency after office hours please contact your primary care physician or go to the nearest emergency department.  Please call the clinic during office hours if you have any questions or concerns.   You may also contact the Patient Navigator at [phone] should you have any questions or need assistance in obtaining follow up care.      Resources For Cancer Patients and their Caregivers ? American Cancer Society: Can assist with transportation, wigs, general needs, runs Look Good Feel Better.        1-888-227-6333 ? Cancer Care: Provides financial assistance, online support groups, medication/co-pay assistance.  1-800-813-HOPE  (4673) ? [facility] Assists [place] cancer patients and their families through emotional , educational and financial support.  336-427-4357 ? [place] DSS Where to apply for food stamps, Medicaid and utility assistance. 336-342-1394 ? RCATS: Transportation to medical appointments. 336-347-2287 ? Social Security Administration: May apply for disability if have a Stage IV cancer. 336-342-7796 1-800-772-1213 ? [place] Aging, Disability and Transit Services: Assists with nutrition, care and transit needs. 336-349-2343         

## 2016-07-10 NOTE — Patient Instructions (Addendum)
[place] at [facility] Discharge Instructions  RECOMMENDATIONS MADE BY THE CONSULTANT AND ANY TEST RESULTS WILL BE SENT TO YOUR REFERRING PHYSICIAN.  You saw Dr.Penland today.  Continue Rituxan weekly with labs.  Follow up with MD in 2 weeks with labs.  Taper Prednisone:   40 mg po daily x 5 days   20 mg po daily x 5 days   10 mg po daily x 3 days.   10 mg po every other day x 5 days.   Then Off.  See Amy at checkout for appointments.  Thank you for choosing [place] at [facility] to provide your oncology and hematology care.  To afford each patient quality time with our provider, please arrive at least 15 minutes before your scheduled appointment time.   Beginning January 23rd 2017 lab work for the [facility] will be done in the  Main lab at [facility] on 1st floor. If you have a lab appointment with the [facility] please come in thru the  Main Entrance and check in at the main information desk  You need to re-schedule your appointment should you arrive 10 or more minutes late.  We strive to give you quality time with our providers, and arriving late affects you and other patients whose appointments are after yours.  Also, if you no show three or more times for appointments you may be dismissed from the clinic at the providers discretion.     Again, thank you for choosing [facility].  Our hope is that these requests will decrease the amount of time that you wait before being seen by our physicians.       _____________________________________________________________  Should you have questions after your visit to [facility], please contact our office at [phone] between the hours of 8:30 a.m. and 4:30 p.m.  Voicemails left after 4:30 p.m. will not be returned until the following business day.  For prescription refill requests, have your pharmacy contact our office.         Resources For  Cancer Patients and their Caregivers ? American Cancer Society: Can assist with transportation, wigs, general needs, runs Look Good Feel Better.        615 280 0775 ? Cancer Care: Provides financial assistance, online support groups, medication/co-pay assistance.  1-800-813-HOPE 930-862-5095) ? [facility] Assists [place] cancer patients and their families through emotional , educational and financial support.  915-670-9063 ? [place] DSS Where to apply for food stamps, Medicaid and utility assistance. 267-577-5767 ? RCATS: Transportation to medical appointments. 7324963008 ? Social Security Administration: May apply for disability if have a Stage IV cancer. 480-550-4879 725-714-8540 ? [employer], Disability and Transit Services: Assists with nutrition, care and transit needs. [place] Support Programs: @10RELATIVEDAYS @ > Cancer Support Group  2nd Tuesday of the month 1pm-2pm, Journey Room  > Creative Journey  3rd Tuesday of the month 1130am-1pm, Journey Room  > Look Good Feel Better  1st Wednesday of the month 10am-12 noon, Journey Room (Call [place] to register [phone])

## 2016-07-10 NOTE — Progress Notes (Signed)
Theresa Morrison tolerated chemo tx well without complaints or incident. CBCD results reviewed prior to administering chemotherapy. VSS upon discharge. Pt discharged self ambulatory in satisfactory condition accompanied by her husband

## 2016-07-10 NOTE — Progress Notes (Signed)
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 female is here for follow-up of a history of hemolytic anemia. She has relapsed disease. Currently on single agent rituxan and prednisone. She does not like the side effects of prednisone. She is doing OK overall.   Theresa Morrison is here with her husband today. She is here today for her next dose of Rituximab. I personally reviewed and went over laboratory results with the patient.  She says that she has been doing well on Rituximab. No problems such as joint aches, fever or headaches.   She would like to decrease her Prednisone if possible.   Denies any complaints today.  Appetite is good. Energy is unchanged.   MEDICAL HISTORY:  Past Medical History:  Diagnosis Date  . Anemia   . Colon polyps    adenomatous  . CVA (cerebral vascular accident) ([place])   . Headache   . HIV positive ([place]) 1987  . Hypercholesterolemia   . Hyperlipidemia   . Internal hemorrhoids   . Shingles   . Toxoplasmosis     SURGICAL HISTORY: Past Surgical History:  Procedure Laterality Date  . APPENDECTOMY  2001  . KNEE ARTHROSCOPY Bilateral   . SHOULDER SURGERY Right   . TONSILLECTOMY      SOCIAL HISTORY: Social History   Social History  . Marital status: Married    Spouse name: [name]  . Number of children: 2  . Years of education: 12    Occupational History  .  Unemployed   Social History Main Topics  . Smoking status: Never Smoker  . Smokeless tobacco: Never Used  . Alcohol use No  . Drug use: No  . Sexual activity: Not Currently     Comment: declined condoms   Other Topics Concern  . Not on file   Social History Narrative   Lives with spouse and son   Caffeine use: 12oz daily    FAMILY HISTORY: Family History  Problem Relation Age of Onset  . Heart attack Father   . High Cholesterol Father   . Breast cancer Mother     mets to lung and brain  . Cancer Mother   . Prostate cancer Brother   . Stroke Neg Hx   . Migraines Neg Hx   . Neuropathy Neg Hx    indicated that her mother is deceased. She indicated that her father is deceased. She indicated that the status of her brother is unknown. She indicated that the status of her neg hx is unknown.    Mother died at 60 from breast cancer Father died at 29 from MI 1 sister, 3 brothers all healthy  ALLERGIES:  is allergic to penicillins; stavudine; and sulfamethoxazole-trimethoprim.  MEDICATIONS:  Current Outpatient Prescriptions  Medication Sig Dispense Refill  . acetaminophen (TYLENOL) 500 MG tablet Take 500 mg by mouth every 6 (six) hours as needed.    Marland Kitchen aspirin 81 MG tablet Take 81 mg by mouth daily.      Marland Kitchen  atorvastatin (LIPITOR) 10 MG tablet Take 10 mg by mouth daily.    . beta carotene w/minerals (OCUVITE) tablet Take 2 tablets by mouth daily.    . calcium carbonate (OS-CAL) 600 MG TABS Take 1,200 mg by mouth daily.     . Cholecalciferol (VITAMIN D-3) 1000 units CAPS Take 1,000 Units by mouth daily.    . clopidogrel (PLAVIX) 75 MG tablet Take 1 tablet (75 mg total) by mouth daily. 90 tablet 3  . diazepam (VALIUM) 2 MG tablet Take 1 tablet (2 mg total) by mouth every 6 (six) hours as needed for vertigo. May try 1/2 pill as well. 20 tablet 4  . KLOR-CON M20 20 MEQ tablet TAKE 1 TABLET (20 MEQ TOTAL) BY MOUTH 2 (TWO) TIMES DAILY. 60 tablet 0  .  Omega-3 Fatty Acids (FISH OIL) 1000 MG CAPS Take 1 capsule by mouth daily.     . ondansetron (ZOFRAN) 8 MG tablet Take 1 tablet (8 mg total) by mouth every 8 (eight) hours as needed for nausea or vomiting. 30 tablet 2  . predniSONE (DELTASONE) 20 MG tablet Take 3 tablets (60 mg total) by mouth daily with breakfast. 90 tablet 0  . prochlorperazine (COMPAZINE) 10 MG tablet Take 1 tablet (10 mg total) by mouth every 6 (six) hours as needed for nausea or vomiting. 30 tablet 2  . RiTUXimab (RITUXAN IV) Inject into the vein. Weekly x 4 weeks    . TRIUMEQ 600-50-300 MG tablet TAKE 1 TABLET DAILY 90 tablet 1  . zolpidem (AMBIEN) 10 MG tablet Take 1/2-1 tablet at bedtime as needed for trouble sleeping 30 tablet 3   No current facility-administered medications for this visit.    Review of Systems  Constitutional: Negative.   HENT: Negative.   Eyes: Negative.   Respiratory: Negative.   Cardiovascular: Negative.   Gastrointestinal: Negative.   Genitourinary: Negative.   Musculoskeletal: Negative.   Skin: Negative.   Neurological: Negative.   Endo/Heme/Allergies: Negative.   Psychiatric/Behavioral: Negative.   All other systems reviewed and are negative.   14 point review of systems was performed and is negative except as detailed under history of present illness and above   PHYSICAL EXAMINATION: ECOG PERFORMANCE STATUS: 1 - Symptomatic but completely ambulatory  Vitals:   07/10/16 0904  BP: 120/69  Pulse: 70  Resp: 16  Temp: 98.4 F (36.9 C)   Filed Weights   07/10/16 0904  Weight: 167 lb 14.4 oz (76.2 kg)     Physical Exam  Constitutional: She is oriented to person, place, and time and well-developed, well-nourished, and in no distress.  Pt was able to get on exam table without assistance.   HENT:  Head: Normocephalic and atraumatic.  Eyes: EOM are normal. Pupils are equal, round, and reactive to light. No scleral icterus.  Neck: Normal range of motion. Neck supple.   Cardiovascular: Normal rate, regular rhythm and normal heart sounds.   Pulmonary/Chest: Effort normal and breath sounds normal.  Abdominal: Soft. Bowel sounds are normal. She exhibits no distension. There is no tenderness.  Musculoskeletal: Normal range of motion. She exhibits no edema.  Lymphadenopathy:    She has no cervical adenopathy.  Neurological: She is alert and oriented to person, place, and time. No cranial nerve deficit. Gait normal.  Skin: Skin is warm and dry.  Psychiatric: Mood, memory, affect and judgment normal.  Nursing note and vitals reviewed.    LABORATORY DATA:  I have reviewed the data as listed Results for Theresa Morrison, Theresa Morrison (  MRN [id number]) as of 07/10/2016 08:03  Ref. Range 07/03/2016 08:45  Sodium Latest Ref Range: 135 - 145 mmol/L 138  Potassium Latest Ref Range: 3.5 - 5.1 mmol/L 3.6  Chloride Latest Ref Range: 101 - 111 mmol/L 105  CO2 Latest Ref Range: 22 - 32 mmol/L 26  BUN Latest Ref Range: 6 - 20 mg/dL 16  Creatinine Latest Ref Range: 0.44 - 1.00 mg/dL 1.04 (H)  Calcium Latest Ref Range: 8.9 - 10.3 mg/dL 9.2  EGFR (Non-African Amer.) Latest Ref Range: >60 mL/min 58 (L)  EGFR (African American) Latest Ref Range: >60 mL/min >60  Glucose Latest Ref Range: 65 - 99 mg/dL 87  Anion gap Latest Ref Range: 5 - 15  7  Alkaline Phosphatase Latest Ref Range: 38 - 126 U/L 121  Albumin Latest Ref Range: 3.5 - 5.0 g/dL 4.0  AST Latest Ref Range: 15 - 41 U/L 24  ALT Latest Ref Range: 14 - 54 U/L 37  Total Protein Latest Ref Range: 6.5 - 8.1 g/dL 6.3 (L)  Total Bilirubin Latest Ref Range: 0.3 - 1.2 mg/dL 0.8  LDH Latest Ref Range: 98 - 192 U/L 246 (H)  WBC Latest Ref Range: 4.0 - 10.5 K/uL 6.9  RBC Latest Ref Range: 3.87 - 5.11 MIL/uL 3.76 (L)  Hemoglobin Latest Ref Range: 12.0 - 15.0 g/dL 13.6  HCT Latest Ref Range: 36.0 - 46.0 % 38.5  MCV Latest Ref Range: 78.0 - 100.0 fL 102.4 (H)  MCH Latest Ref Range: 26.0 - 34.0 pg 36.2 (H)  MCHC Latest Ref Range: 30.0  - 36.0 g/dL 35.3  RDW Latest Ref Range: 11.5 - 15.5 % 13.7  Platelets Latest Ref Range: 150 - 400 K/uL 140 (L)  Neutrophils Latest Units: % 84  Lymphocytes Latest Units: % 9  Monocytes Relative Latest Units: % 7  Eosinophil Latest Units: % 0  Basophil Latest Units: % 0  NEUT# Latest Ref Range: 1.7 - 7.7 K/uL 5.8  Lymphocyte # Latest Ref Range: 0.7 - 4.0 K/uL 0.6 (L)  Monocyte # Latest Ref Range: 0.1 - 1.0 K/uL 0.5  Eosinophils Absolute Latest Ref Range: 0.0 - 0.7 K/uL 0.0  Basophils Absolute Latest Ref Range: 0.0 - 0.1 K/uL 0.0  RBC. Latest Ref Range: 3.87 - 5.11 MIL/uL 3.76 (L)  Retic Ct Pct Latest Ref Range: 0.4 - 3.1 % 2.2  Retic Count, Manual Latest Ref Range: 19.0 - 186.0 K/uL 82.7  Haptoglobin Latest Ref Range: 34 - 200 mg/dL 64    Results for Theresa Morrison, Theresa Morrison (MRN [id number])   Ref. Range 07/10/2016 10:07  WBC Latest Ref Range: 4.0 - 10.5 K/uL 7.8  RBC Latest Ref Range: 3.87 - 5.11 MIL/uL 3.73 (L)  Hemoglobin Latest Ref Range: 12.0 - 15.0 g/dL 13.5  HCT Latest Ref Range: 36.0 - 46.0 % 38.3  MCV Latest Ref Range: 78.0 - 100.0 fL 102.7 (H)  MCH Latest Ref Range: 26.0 - 34.0 pg 36.2 (H)  MCHC Latest Ref Range: 30.0 - 36.0 g/dL 35.2  RDW Latest Ref Range: 11.5 - 15.5 % 13.2  Platelets Latest Ref Range: 150 - 400 K/uL 131 (L)  Neutrophils Latest Units: % 88  Lymphocytes Latest Units: % 6  Monocytes Relative Latest Units: % 6  Eosinophil Latest Units: % 0  Basophil Latest Units: % 0  NEUT# Latest Ref Range: 1.7 - 7.7 K/uL 6.9  Lymphocyte # Latest Ref Range: 0.7 - 4.0 K/uL 0.4 (L)  Monocyte # Latest Ref Range: 0.1 - 1.0 K/uL 0.4  Eosinophils Absolute Latest Ref Range: 0.0 - 0.7 K/uL 0.0  Basophils Absolute Latest Ref Range: 0.0 - 0.1 K/uL 0.0      RADIOLOGY: I have personally reviewed the radiological images as listed and agreed with the findings in the report. Study Result   CLINICAL DATA:  60 year old female with a dense fibroglandular pattern. Family history of  breast cancer. The patient'Morrison lifetime risk for developing breast cancer is estimated at 28.5%.  LABS:  None obtained at the time of imaging.  EXAM: BILATERAL BREAST MRI WITH AND WITHOUT CONTRAST  TECHNIQUE: Multiplanar, multisequence MR images of both breasts were obtained prior to and following the intravenous administration of 15 ml of MultiHance.  THREE-DIMENSIONAL MR IMAGE RENDERING ON INDEPENDENT WORKSTATION:  Three-dimensional MR images were rendered by post-processing of the original MR data on an independent workstation. The three-dimensional MR images were interpreted, and findings are reported in the following complete MRI report for this study. Three dimensional images were evaluated at the independent DynaCad workstation  COMPARISON:  Previous exam(Morrison).  FINDINGS: Breast composition: c. Heterogeneous fibroglandular tissue.  Background parenchymal enhancement: Moderate.  Right breast: No mass or abnormal enhancement.  Left breast: No mass or abnormal enhancement.  Lymph nodes: No abnormal appearing lymph nodes.  Ancillary findings:  None.  IMPRESSION: No abnormal enhancement in either breast.  RECOMMENDATION: Bilateral screening mammogram in November of 2018 is recommended.  The American Cancer Society recommends annual MRI and mammography in patients with an estimated lifetime risk of developing breast cancer greater than 20 - 25%, or who are known or suspected to be positive for the breast cancer gene.  BI-RADS CATEGORY  1: Negative.   Electronically Signed   By: Lillia Mountain M.D.   On: 06/21/2016 09:27    ASSESSMENT & PLAN:   Coombs positive hemolytic anemia Steroid responsive HIV Persistently low haptoglobin Mild thrombocytopenia Macrocytosis, normal B12, folate 10/2015 Rituxan  Continue with Rituxan therapy. She tolerated her first dose well.  Decrease Prednisone from 92m to 43m We will taper her off of it.  Instructions in discharge notes.   Will continue to keep Dr. CaMegan Salonpdated.   She will return for a follow up in 2 weeks, after her next dose of Rituximab. Continue with weekly CBC'Morrison, LDH and retic.   Orders Placed This Encounter  Procedures  . CBC with Differential    Standing Status:   Future    Standing Expiration Date:   07/10/2017  . Comprehensive metabolic panel    Standing Status:   Future    Standing Expiration Date:   07/10/2017  . Reticulocytes    Standing Status:   Future    Standing Expiration Date:   07/10/2017  . Lactate dehydrogenase    Standing Status:   Future    Standing Expiration Date:   07/10/2017  . CBC with Differential    Standing Status:   Standing    Number of Occurrences:   10    Standing Expiration Date:   07/10/2017  . Comprehensive metabolic panel    Standing Status:   Standing    Number of Occurrences:   10    Standing Expiration Date:   07/10/2017    All questions were answered. The patient knows to call the clinic with any problems, questions or concerns  This document serves as a record of services personally performed by ShAncil LinseyMD. It was created on her behalf by [email or web address], a trained medical scribe. The creation of this record is based  on the scribe'Morrison personal observations and the provider'Morrison statements to them. This document has been checked and approved by the attending provider.  I have reviewed the above documentation for accuracy and completeness, and I agree with the above.  This note was electronically signed. Molli Hazard, MD  07/10/2016 9:16 AM

## 2016-07-11 ENCOUNTER — Other Ambulatory Visit (HOSPITAL_COMMUNITY): Payer: Federal, State, Local not specified - PPO

## 2016-07-11 ENCOUNTER — Ambulatory Visit (HOSPITAL_COMMUNITY): Payer: Federal, State, Local not specified - PPO | Admitting: Hematology & Oncology

## 2016-07-13 ENCOUNTER — Other Ambulatory Visit (HOSPITAL_COMMUNITY): Payer: Federal, State, Local not specified - PPO

## 2016-07-13 ENCOUNTER — Ambulatory Visit (HOSPITAL_COMMUNITY): Payer: Federal, State, Local not specified - PPO | Admitting: Hematology & Oncology

## 2016-07-18 ENCOUNTER — Other Ambulatory Visit (HOSPITAL_COMMUNITY): Payer: Self-pay | Admitting: Oncology

## 2016-07-18 ENCOUNTER — Encounter (HOSPITAL_BASED_OUTPATIENT_CLINIC_OR_DEPARTMENT_OTHER): Payer: Federal, State, Local not specified - PPO

## 2016-07-18 ENCOUNTER — Encounter (HOSPITAL_COMMUNITY): Payer: Self-pay

## 2016-07-18 VITALS — BP 102/64 | HR 90 | Temp 98.4°F | Resp 18 | Wt 170.6 lb

## 2016-07-18 DIAGNOSIS — D591 Other autoimmune hemolytic anemias: Principal | ICD-10-CM

## 2016-07-18 DIAGNOSIS — Z5112 Encounter for antineoplastic immunotherapy: Secondary | ICD-10-CM

## 2016-07-18 DIAGNOSIS — D5919 Other autoimmune hemolytic anemia: Secondary | ICD-10-CM

## 2016-07-18 DIAGNOSIS — D589 Hereditary hemolytic anemia, unspecified: Secondary | ICD-10-CM | POA: Diagnosis not present

## 2016-07-18 LAB — COMPREHENSIVE METABOLIC PANEL
ALBUMIN: 4.1 g/dL (ref 3.5–5.0)
ALT: 33 U/L (ref 14–54)
AST: 22 U/L (ref 15–41)
Alkaline Phosphatase: 107 U/L (ref 38–126)
Anion gap: 8 (ref 5–15)
BUN: 18 mg/dL (ref 6–20)
CHLORIDE: 106 mmol/L (ref 101–111)
CO2: 26 mmol/L (ref 22–32)
Calcium: 9.4 mg/dL (ref 8.9–10.3)
Creatinine, Ser: 1.12 mg/dL — ABNORMAL HIGH (ref 0.44–1.00)
GFR calc Af Amer: >60 mL/min (ref >60)
GFR calc non Af Amer: 53 mL/min — ABNORMAL LOW (ref >60)
GLUCOSE: 78 mg/dL (ref 65–99)
POTASSIUM: 3.5 mmol/L (ref 3.5–5.1)
SODIUM: 140 mmol/L (ref 135–145)
Total Bilirubin: 0.6 mg/dL (ref 0.3–1.2)
Total Protein: 6.4 g/dL — ABNORMAL LOW (ref 6.5–8.1)

## 2016-07-18 LAB — CBC WITH DIFFERENTIAL/PLATELET
BASOS ABS: 0 10*3/uL (ref 0.0–0.1)
BASOS PCT: 0 %
EOS PCT: 0 %
Eosinophils Absolute: 0 10*3/uL (ref 0.0–0.7)
HCT: 40.5 % (ref 36.0–46.0)
Hemoglobin: 14 g/dL (ref 12.0–15.0)
Lymphocytes Relative: 17 %
Lymphs Abs: 1.3 10*3/uL (ref 0.7–4.0)
MCH: 35.4 pg — ABNORMAL HIGH (ref 26.0–34.0)
MCHC: 34.6 g/dL (ref 30.0–36.0)
MCV: 102.5 fL — AB (ref 78.0–100.0)
Monocytes Absolute: 0.6 10*3/uL (ref 0.1–1.0)
Monocytes Relative: 7 %
NEUTROS ABS: 5.6 10*3/uL (ref 1.7–7.7)
Neutrophils Relative %: 76 %
PLATELETS: 137 10*3/uL — AB (ref 150–400)
RBC: 3.95 MIL/uL (ref 3.87–5.11)
RDW: 13.1 % (ref 11.5–15.5)
WBC: 7.4 10*3/uL (ref 4.0–10.5)

## 2016-07-18 LAB — LACTATE DEHYDROGENASE: LDH: 248 U/L — AB (ref 98–192)

## 2016-07-18 LAB — RETICULOCYTES
RBC.: 3.95 MIL/uL (ref 3.87–5.11)
RETIC COUNT ABSOLUTE: 59.3 10*3/uL (ref 19.0–186.0)
Retic Ct Pct: 1.5 % (ref 0.4–3.1)

## 2016-07-18 MED ORDER — SODIUM CHLORIDE 0.9 % IV SOLN
Freq: Once | INTRAVENOUS | Status: AC
  Administered 2016-07-18: 11:00:00 via INTRAVENOUS

## 2016-07-18 MED ORDER — ACETAMINOPHEN 325 MG PO TABS
650.0000 mg | ORAL_TABLET | Freq: Once | ORAL | Status: AC
  Administered 2016-07-18: 650 mg via ORAL

## 2016-07-18 MED ORDER — DIPHENHYDRAMINE HCL 25 MG PO CAPS
50.0000 mg | ORAL_CAPSULE | Freq: Once | ORAL | Status: AC
  Administered 2016-07-18: 50 mg via ORAL

## 2016-07-18 MED ORDER — ACETAMINOPHEN 325 MG PO TABS
ORAL_TABLET | ORAL | Status: AC
  Filled 2016-07-18: qty 2

## 2016-07-18 MED ORDER — SODIUM CHLORIDE 0.9 % IV SOLN
375.0000 mg/m2 | Freq: Once | INTRAVENOUS | Status: AC
  Administered 2016-07-18: 700 mg via INTRAVENOUS
  Filled 2016-07-18: qty 20

## 2016-07-18 MED ORDER — PREDNISONE 10 MG PO TABS: 10.0000 mg | ORAL_TABLET | Freq: Every day | ORAL | 0 refills | Status: DC

## 2016-07-18 MED ORDER — DIPHENHYDRAMINE HCL 25 MG PO CAPS
ORAL_CAPSULE | ORAL | Status: AC
  Filled 2016-07-18: qty 2

## 2016-07-18 NOTE — Patient Instructions (Signed)
[facility] Discharge Instructions for Patients Receiving Chemotherapy   Beginning January 23rd 2017 lab work for the Cancer Center will be done in the  Main lab at [place] on 1st floor. If you have a lab appointment with the Cancer Center please come in thru the  Main Entrance and check in at the main information desk   Today you received the following chemotherapy agents Rituxan  To help prevent nausea and vomiting after your treatment, we encourage you to take your nausea medication   If you develop nausea and vomiting, or diarrhea that is not controlled by your medication, call the clinic.  The clinic phone number is [phone]. Office hours are Monday-Friday 8:30am-5:00pm.  BELOW ARE SYMPTOMS THAT SHOULD BE REPORTED IMMEDIATELY:  *FEVER GREATER THAN 101.0 F  *CHILLS WITH OR WITHOUT FEVER  NAUSEA AND VOMITING THAT IS NOT CONTROLLED WITH YOUR NAUSEA MEDICATION  *UNUSUAL SHORTNESS OF BREATH  *UNUSUAL BRUISING OR BLEEDING  TENDERNESS IN MOUTH AND THROAT WITH OR WITHOUT PRESENCE OF ULCERS  *URINARY PROBLEMS  *BOWEL PROBLEMS  UNUSUAL RASH Items with * indicate a potential emergency and should be followed up as soon as possible. If you have an emergency after office hours please contact your primary care physician or go to the nearest emergency department.  Please call the clinic during office hours if you have any questions or concerns.   You may also contact the Patient Navigator at [phone] should you have any questions or need assistance in obtaining follow up care.      Resources For Cancer Patients and their Caregivers ? American Cancer Society: Can assist with transportation, wigs, general needs, runs Look Good Feel Better.        1-888-227-6333 ? Cancer Care: Provides financial assistance, online support groups, medication/co-pay assistance.  1-800-813-HOPE (4673) ? [facility] Assists [place] cancer  patients and their families through emotional , educational and financial support.  336-427-4357 ? [place] DSS Where to apply for food stamps, Medicaid and utility assistance. 336-342-1394 ? RCATS: Transportation to medical appointments. 336-347-2287 ? Social Security Administration: May apply for disability if have a Stage IV cancer. 336-342-7796 1-800-772-1213 ? [place] Aging, Disability and Transit Services: Assists with nutrition, care and transit needs. 336-349-2343          

## 2016-07-18 NOTE — Progress Notes (Signed)
Chemotherapy given today per orders. Patient tolerated it well. No problems. Vitals stable and discharged ambulatory from clinic.follow up as scheduled

## 2016-07-19 LAB — HAPTOGLOBIN: HAPTOGLOBIN: 86 mg/dL (ref 34–200)

## 2016-07-25 ENCOUNTER — Encounter (HOSPITAL_COMMUNITY): Payer: Federal, State, Local not specified - PPO

## 2016-07-25 ENCOUNTER — Encounter (HOSPITAL_COMMUNITY): Payer: Federal, State, Local not specified - PPO | Attending: Oncology

## 2016-07-25 ENCOUNTER — Encounter (HOSPITAL_BASED_OUTPATIENT_CLINIC_OR_DEPARTMENT_OTHER): Payer: Federal, State, Local not specified - PPO | Admitting: Oncology

## 2016-07-25 VITALS — BP 113/68 | HR 85 | Temp 97.5°F | Resp 18

## 2016-07-25 VITALS — BP 109/65 | HR 84 | Temp 97.9°F | Resp 20 | Wt 170.1 lb

## 2016-07-25 DIAGNOSIS — Z5112 Encounter for antineoplastic immunotherapy: Secondary | ICD-10-CM

## 2016-07-25 DIAGNOSIS — D5919 Other autoimmune hemolytic anemia: Secondary | ICD-10-CM

## 2016-07-25 DIAGNOSIS — K648 Other hemorrhoids: Secondary | ICD-10-CM | POA: Diagnosis not present

## 2016-07-25 DIAGNOSIS — Z803 Family history of malignant neoplasm of breast: Secondary | ICD-10-CM | POA: Diagnosis not present

## 2016-07-25 DIAGNOSIS — D696 Thrombocytopenia, unspecified: Secondary | ICD-10-CM | POA: Insufficient documentation

## 2016-07-25 DIAGNOSIS — Z8673 Personal history of transient ischemic attack (TIA), and cerebral infarction without residual deficits: Secondary | ICD-10-CM | POA: Insufficient documentation

## 2016-07-25 DIAGNOSIS — Z86010 Personal history of colonic polyps: Secondary | ICD-10-CM | POA: Insufficient documentation

## 2016-07-25 DIAGNOSIS — D591 Other autoimmune hemolytic anemias: Secondary | ICD-10-CM

## 2016-07-25 DIAGNOSIS — B20 Human immunodeficiency virus [HIV] disease: Secondary | ICD-10-CM | POA: Diagnosis not present

## 2016-07-25 DIAGNOSIS — D589 Hereditary hemolytic anemia, unspecified: Secondary | ICD-10-CM | POA: Diagnosis not present

## 2016-07-25 DIAGNOSIS — D7589 Other specified diseases of blood and blood-forming organs: Secondary | ICD-10-CM | POA: Diagnosis not present

## 2016-07-25 DIAGNOSIS — Z880 Allergy status to penicillin: Secondary | ICD-10-CM | POA: Insufficient documentation

## 2016-07-25 DIAGNOSIS — Z7982 Long term (current) use of aspirin: Secondary | ICD-10-CM | POA: Insufficient documentation

## 2016-07-25 DIAGNOSIS — Z79899 Other long term (current) drug therapy: Secondary | ICD-10-CM | POA: Diagnosis not present

## 2016-07-25 DIAGNOSIS — Z8249 Family history of ischemic heart disease and other diseases of the circulatory system: Secondary | ICD-10-CM | POA: Insufficient documentation

## 2016-07-25 DIAGNOSIS — Z820 Family history of epilepsy and other diseases of the nervous system: Secondary | ICD-10-CM | POA: Insufficient documentation

## 2016-07-25 DIAGNOSIS — F15980 Other stimulant use, unspecified with stimulant-induced anxiety disorder: Secondary | ICD-10-CM | POA: Insufficient documentation

## 2016-07-25 DIAGNOSIS — Z888 Allergy status to other drugs, medicaments and biological substances status: Secondary | ICD-10-CM | POA: Diagnosis not present

## 2016-07-25 DIAGNOSIS — Z9889 Other specified postprocedural states: Secondary | ICD-10-CM | POA: Insufficient documentation

## 2016-07-25 DIAGNOSIS — E7800 Pure hypercholesterolemia, unspecified: Secondary | ICD-10-CM | POA: Insufficient documentation

## 2016-07-25 DIAGNOSIS — Z823 Family history of stroke: Secondary | ICD-10-CM | POA: Diagnosis not present

## 2016-07-25 LAB — COMPREHENSIVE METABOLIC PANEL
ALT: 36 U/L (ref 14–54)
AST: 21 U/L (ref 15–41)
Albumin: 3.8 g/dL (ref 3.5–5.0)
Alkaline Phosphatase: 102 U/L (ref 38–126)
Anion gap: 7 (ref 5–15)
BUN: 13 mg/dL (ref 6–20)
CHLORIDE: 107 mmol/L (ref 101–111)
CO2: 28 mmol/L (ref 22–32)
Calcium: 9.2 mg/dL (ref 8.9–10.3)
Creatinine, Ser: 1.1 mg/dL — ABNORMAL HIGH (ref 0.44–1.00)
GFR calc Af Amer: >60 mL/min (ref >60)
GFR, EST NON AFRICAN AMERICAN: 54 mL/min — AB (ref >60)
Glucose, Bld: 86 mg/dL (ref 65–99)
Potassium: 3.6 mmol/L (ref 3.5–5.1)
Sodium: 142 mmol/L (ref 135–145)
Total Bilirubin: 0.5 mg/dL (ref 0.3–1.2)
Total Protein: 6.1 g/dL — ABNORMAL LOW (ref 6.5–8.1)

## 2016-07-25 LAB — CBC WITH DIFFERENTIAL/PLATELET
BASOS ABS: 0 10*3/uL (ref 0.0–0.1)
Basophils Relative: 0 %
EOS PCT: 0 %
Eosinophils Absolute: 0 10*3/uL (ref 0.0–0.7)
HCT: 39.4 % (ref 36.0–46.0)
HEMOGLOBIN: 13.7 g/dL (ref 12.0–15.0)
LYMPHS ABS: 0.9 10*3/uL (ref 0.7–4.0)
LYMPHS PCT: 17 %
MCH: 36.1 pg — ABNORMAL HIGH (ref 26.0–34.0)
MCHC: 34.8 g/dL (ref 30.0–36.0)
MCV: 104 fL — AB (ref 78.0–100.0)
Monocytes Absolute: 0.5 10*3/uL (ref 0.1–1.0)
Monocytes Relative: 11 %
NEUTROS ABS: 3.5 10*3/uL (ref 1.7–7.7)
NEUTROS PCT: 72 %
PLATELETS: 123 10*3/uL — AB (ref 150–400)
RBC: 3.79 MIL/uL — AB (ref 3.87–5.11)
RDW: 12.9 % (ref 11.5–15.5)
WBC: 4.9 10*3/uL (ref 4.0–10.5)

## 2016-07-25 LAB — RETICULOCYTES
RBC.: 3.79 MIL/uL — ABNORMAL LOW (ref 3.87–5.11)
RETIC CT PCT: 1.7 % (ref 0.4–3.1)
Retic Count, Absolute: 64.4 10*3/uL (ref 19.0–186.0)

## 2016-07-25 LAB — LACTATE DEHYDROGENASE: LDH: 239 U/L — AB (ref 98–192)

## 2016-07-25 MED ORDER — SODIUM CHLORIDE 0.9% FLUSH: 10.0000 mL | INTRAVENOUS | Status: DC | PRN

## 2016-07-25 MED ORDER — DIPHENHYDRAMINE HCL 25 MG PO CAPS
50.0000 mg | ORAL_CAPSULE | Freq: Once | ORAL | Status: AC
  Administered 2016-07-25: 50 mg via ORAL
  Filled 2016-07-25: qty 2

## 2016-07-25 MED ORDER — SODIUM CHLORIDE 0.9 % IV SOLN
Freq: Once | INTRAVENOUS | Status: AC
  Administered 2016-07-25: 09:00:00 via INTRAVENOUS

## 2016-07-25 MED ORDER — ACETAMINOPHEN 325 MG PO TABS
650.0000 mg | ORAL_TABLET | Freq: Once | ORAL | Status: AC
  Administered 2016-07-25: 650 mg via ORAL
  Filled 2016-07-25: qty 2

## 2016-07-25 MED ORDER — HEPARIN SOD (PORK) LOCK FLUSH 100 UNIT/ML IV SOLN: 500.0000 [IU] | Freq: Once | INTRAVENOUS | Status: DC | PRN

## 2016-07-25 MED ORDER — SODIUM CHLORIDE 0.9 % IV SOLN
375.0000 mg/m2 | Freq: Once | INTRAVENOUS | Status: AC
  Administered 2016-07-25: 700 mg via INTRAVENOUS
  Filled 2016-07-25: qty 50

## 2016-07-25 NOTE — Progress Notes (Signed)
Tolerated infusion w/o adverse reaction.  Alert, in no distress.  VSS.  Discharged ambulatory.  

## 2016-07-25 NOTE — Patient Instructions (Signed)
[facility] Discharge Instructions for Patients Receiving Chemotherapy   Beginning January 23rd 2017 lab work for the Cancer Center will be done in the  Main lab at [facility] on 1st floor. If you have a lab appointment with the Cancer Center please come in thru the  Main Entrance and check in at the main information desk   Today you received the following chemotherapy agents:  Rituxan  If you develop nausea and vomiting, or diarrhea that is not controlled by your medication, call the clinic.  The clinic phone number is [phone]. Office hours are Monday-Friday 8:30am-5:00pm.  BELOW ARE SYMPTOMS THAT SHOULD BE REPORTED IMMEDIATELY:  *FEVER GREATER THAN 101.0 F  *CHILLS WITH OR WITHOUT FEVER  NAUSEA AND VOMITING THAT IS NOT CONTROLLED WITH YOUR NAUSEA MEDICATION  *UNUSUAL SHORTNESS OF BREATH  *UNUSUAL BRUISING OR BLEEDING  TENDERNESS IN MOUTH AND THROAT WITH OR WITHOUT PRESENCE OF ULCERS  *URINARY PROBLEMS  *BOWEL PROBLEMS  UNUSUAL RASH Items with * indicate a potential emergency and should be followed up as soon as possible. If you have an emergency after office hours please contact your primary care physician or go to the nearest emergency department.  Please call the clinic during office hours if you have any questions or concerns.   You may also contact the Patient Navigator at [phone] should you have any questions or need assistance in obtaining follow up care.      Resources For Cancer Patients and their Caregivers ? American Cancer Society: Can assist with transportation, wigs, general needs, runs Look Good Feel Better.        1-888-227-6333 ? Cancer Care: Provides financial assistance, online support groups, medication/co-pay assistance.  1-800-813-HOPE (4673) ? [facility] Assists [place] cancer patients and their families through emotional , educational and financial support.   336-427-4357 ? [place] DSS Where to apply for food stamps, Medicaid and utility assistance. 336-342-1394 ? RCATS: Transportation to medical appointments. 336-347-2287 ? Social Security Administration: May apply for disability if have a Stage IV cancer. 336-342-7796 1-800-772-1213 ? [place] Aging, Disability and Transit Services: Assists with nutrition, care and transit needs. 336-349-2343         

## 2016-07-25 NOTE — Assessment & Plan Note (Addendum)
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 Megan Salon (ID).  S/P 4 cycles of weekly Rituxan 07/03/2016- 07/25/2016.  Labs today: CBC diff, CMET, haptoglobin, LDH, retic count.  I personally reviewed and went over laboratory results with the patient.  The results are noted within this dictation.  HGB is WNL, bilirubin is WNL, and retic count has normalized.  Labs every 2 weeks: CBC diff, CMET, LDH, retic count.  Continue with Prednisone taper which will be completed on 07/30/2016.  Return in 4 weeks for follow-up. If remission remains, then we can consider decreasing frequency of lab checks and follow-up appointments.

## 2016-07-25 NOTE — Patient Instructions (Addendum)
[place] at [facility] Discharge Instructions  RECOMMENDATIONS MADE BY THE CONSULTANT AND ANY TEST RESULTS WILL BE SENT TO YOUR REFERRING PHYSICIAN.   Continue Prednisone taper (finishes this weekend)  Labs today  Rituxan #4 today  Labs every 2 weeks  Return in 4 weeks for follow up  Thank you for choosing [place] at [facility] to provide your oncology and hematology care.  To afford each patient quality time with our provider, please arrive at least 15 minutes before your scheduled appointment time.    If you have a lab appointment with the [facility] please come in thru the  Main Entrance and check in at the main information desk  You need to re-schedule your appointment should you arrive 10 or more minutes late.  We strive to give you quality time with our providers, and arriving late affects you and other patients whose appointments are after yours.  Also, if you no show three or more times for appointments you may be dismissed from the clinic at the providers discretion.     Again, thank you for choosing [facility].  Our hope is that these requests will decrease the amount of time that you wait before being seen by our physicians.       _____________________________________________________________  Should you have questions after your visit to [facility], please contact our office at [phone] between the hours of 8:30 a.m. and 4:30 p.m.  Voicemails left after 4:30 p.m. will not be returned until the following business day.  For prescription refill requests, have your pharmacy contact our office.       Resources For Cancer Patients and their Caregivers ? American Cancer Society: Can assist with transportation, wigs, general needs, runs Look Good Feel Better.        539-164-8209 ? Cancer Care: Provides financial assistance, online support groups, medication/co-pay assistance.   1-800-813-HOPE 704-380-4197) ? [facility] Assists [place] cancer patients and their families through emotional , educational and financial support.  418-823-2687 ? [place] DSS Where to apply for food stamps, Medicaid and utility assistance. 619-041-4633 ? RCATS: Transportation to medical appointments. 508-098-6641 ? Social Security Administration: May apply for disability if have a Stage IV cancer. (510) 733-8736 952-284-0122 ? [employer], Disability and Transit Services: Assists with nutrition, care and transit needs. [place] Support Programs: @10RELATIVEDAYS @ > Cancer Support Group  2nd Tuesday of the month 1pm-2pm, Journey Room  > Creative Journey  3rd Tuesday of the month 1130am-1pm, Journey Room  > Look Good Feel Better  1st Wednesday of the month 10am-12 noon, Journey Room (Call [place] to register [phone])

## 2016-07-25 NOTE — Progress Notes (Signed)
[name], [name] [id number]  Other autoimmune hemolytic anemias [facility]) - Plan: CBC with Differential, Comprehensive metabolic panel, Lactate dehydrogenase, Reticulocytes  CURRENT THERAPY: Rituxan weekly x 4 beginning on 07/03/2016 and Prednisone taper.  INTERVAL HISTORY: Theresa Morrison 61 y.o. 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 Megan Salon (ID).   S/P 4 cycles of weekly Rituxan 07/03/2016- 07/25/2016.  She is doing very well.  She denies any complaints today.  She is tolerating Rituxan without any toxicities or side effects.  Today is her last treatment.  She is completing her Prednisone taper which she reports will end this weekend.  She notes some distal extremity weakness/heaviness which she notes occurs with each Prednisone treatment course.  She notes that this typically resolves about 2 weeks after completing Prednisone treatment.  Review of Systems  Constitutional: Negative.  Negative for chills, fever and weight loss.  HENT: Negative.   Eyes: Negative.   Respiratory: Negative.  Negative for cough.   Cardiovascular: Negative.  Negative for chest pain.  Gastrointestinal: Negative.  Negative for constipation, diarrhea, nausea and vomiting.  Genitourinary: Negative.   Musculoskeletal: Negative.   Skin: Negative.   Neurological: Negative.  Negative for weakness.  Endo/Heme/Allergies: Negative.     Past Medical History:  Diagnosis Date  . Anemia   . Colon polyps    adenomatous  . CVA (cerebral vascular accident) ([place])   . Headache   . HIV positive ([place]) 1987  . Hypercholesterolemia   . Hyperlipidemia   . Internal hemorrhoids   . Shingles   . Toxoplasmosis     Past Surgical History:  Procedure Laterality Date  . APPENDECTOMY  2001  . KNEE ARTHROSCOPY Bilateral   . SHOULDER SURGERY Right   .  TONSILLECTOMY      Family History  Problem Relation Age of Onset  . Heart attack Father   . High Cholesterol Father   . Breast cancer Mother     mets to lung and brain  . Cancer Mother   . Prostate cancer Brother   . Stroke Neg Hx   . Migraines Neg Hx   . Neuropathy Neg Hx     Social History   Social History  . Marital status: Married    Spouse name: [name]  . Number of children: 2  . Years of education: 12   Occupational History  .  Unemployed   Social History Main Topics  . Smoking status: Never Smoker  . Smokeless tobacco: Never Used  . Alcohol use No  . Drug use: No  . Sexual activity: Not Currently     Comment: declined condoms   Other Topics Concern  . Not on file   Social History Narrative   Lives with spouse and son   Caffeine use: 12oz daily     PHYSICAL EXAMINATION  ECOG PERFORMANCE STATUS: 0 - Asymptomatic  Vitals:   07/25/16 0852  BP: 109/65  Pulse: 84  Resp: 20  Temp: 97.9 F (36.6 C)    GENERAL:alert, no distress, well nourished, well developed, comfortable, cooperative, obese, smiling and accompanied by husband SKIN: skin color, texture, turgor are normal, no rashes or significant lesions HEAD: Normocephalic, No masses, lesions, tenderness or abnormalities EYES: normal, EOMI, Conjunctiva are pink and non-injected EARS: External ears normal OROPHARYNX:lips, buccal mucosa, and tongue normal and mucous membranes are moist  NECK: supple, trachea midline LYMPH:  no palpable lymphadenopathy BREAST:not examined LUNGS: clear to auscultation  HEART: regular rate & rhythm ABDOMEN:abdomen soft and obese BACK: Back symmetric, no curvature. EXTREMITIES:less then 2 second capillary refill, no joint deformities, effusion, or inflammation, no skin discoloration, no cyanosis  NEURO: alert & oriented x 3 with fluent speech, no focal motor/sensory deficits, gait normal    LABORATORY DATA: CBC    Component Value Date/Time   WBC 4.9 07/25/2016  0839   RBC 3.79 (L) 07/25/2016 0839   RBC 3.79 (L) 07/25/2016 0839   HGB 13.7 07/25/2016 0839   HCT 39.4 07/25/2016 0839   PLT 123 (L) 07/25/2016 0839   MCV 104.0 (H) 07/25/2016 0839   MCH 36.1 (H) 07/25/2016 0839   MCHC 34.8 07/25/2016 0839   RDW 12.9 07/25/2016 0839   LYMPHSABS 0.9 07/25/2016 0839   MONOABS 0.5 07/25/2016 0839   EOSABS 0.0 07/25/2016 0839   BASOSABS 0.0 07/25/2016 0839      Chemistry      Component Value Date/Time   NA 142 07/25/2016 0839   K 3.6 07/25/2016 0839   CL 107 07/25/2016 0839   CO2 28 07/25/2016 0839   BUN 13 07/25/2016 0839   CREATININE 1.10 (H) 07/25/2016 0839   CREATININE 1.20 (H) 04/06/2016 0925      Component Value Date/Time   CALCIUM 9.2 07/25/2016 0839   ALKPHOS 102 07/25/2016 0839   AST 21 07/25/2016 0839   ALT 36 07/25/2016 0839   BILITOT 0.5 07/25/2016 0839        PENDING LABS:   RADIOGRAPHIC STUDIES:  No results found.   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 Megan Salon (ID).  S/P 4 cycles of weekly Rituxan 07/03/2016- 07/25/2016.  Labs today: CBC diff, CMET, haptoglobin, LDH, retic count.  I personally reviewed and went over laboratory results with the patient.  The results are noted within this dictation.  HGB is WNL, bilirubin is WNL, and retic count has normalized.  Labs every 2 weeks: CBC diff, CMET, LDH, retic count.  Continue with Prednisone taper which will be completed on 07/30/2016.  Return in 4 weeks for follow-up. If remission remains, then we can consider decreasing frequency of lab checks and follow-up appointments.   ORDERS PLACED FOR THIS ENCOUNTER: Orders Placed This Encounter  Procedures  . CBC with Differential  . Comprehensive metabolic panel  . Lactate dehydrogenase  . Reticulocytes    MEDICATIONS PRESCRIBED THIS  ENCOUNTER: No orders of the defined types were placed in this encounter.   THERAPY PLAN:  Complete 4th cycle of single-agent Rituxan  All questions were answered. The patient knows to call the clinic with any problems, questions or concerns. We can certainly see the patient much sooner if necessary.  Patient and plan discussed with Dr. Ancil Linsey and she is in agreement with the aforementioned.   This note is electronically signed by: Doy Mince 07/25/2016 9:24 AM

## 2016-08-08 ENCOUNTER — Encounter (HOSPITAL_COMMUNITY): Payer: Federal, State, Local not specified - PPO

## 2016-08-08 DIAGNOSIS — Z823 Family history of stroke: Secondary | ICD-10-CM | POA: Diagnosis not present

## 2016-08-08 DIAGNOSIS — Z9889 Other specified postprocedural states: Secondary | ICD-10-CM | POA: Diagnosis not present

## 2016-08-08 DIAGNOSIS — E7800 Pure hypercholesterolemia, unspecified: Secondary | ICD-10-CM | POA: Diagnosis not present

## 2016-08-08 DIAGNOSIS — F15980 Other stimulant use, unspecified with stimulant-induced anxiety disorder: Secondary | ICD-10-CM | POA: Diagnosis not present

## 2016-08-08 DIAGNOSIS — Z888 Allergy status to other drugs, medicaments and biological substances status: Secondary | ICD-10-CM | POA: Diagnosis not present

## 2016-08-08 DIAGNOSIS — D5919 Other autoimmune hemolytic anemia: Secondary | ICD-10-CM

## 2016-08-08 DIAGNOSIS — Z8249 Family history of ischemic heart disease and other diseases of the circulatory system: Secondary | ICD-10-CM | POA: Diagnosis not present

## 2016-08-08 DIAGNOSIS — K648 Other hemorrhoids: Secondary | ICD-10-CM | POA: Diagnosis not present

## 2016-08-08 DIAGNOSIS — Z7982 Long term (current) use of aspirin: Secondary | ICD-10-CM | POA: Diagnosis not present

## 2016-08-08 DIAGNOSIS — Z79899 Other long term (current) drug therapy: Secondary | ICD-10-CM | POA: Diagnosis not present

## 2016-08-08 DIAGNOSIS — Z8673 Personal history of transient ischemic attack (TIA), and cerebral infarction without residual deficits: Secondary | ICD-10-CM | POA: Diagnosis not present

## 2016-08-08 DIAGNOSIS — Z880 Allergy status to penicillin: Secondary | ICD-10-CM | POA: Diagnosis not present

## 2016-08-08 DIAGNOSIS — D591 Other autoimmune hemolytic anemias: Principal | ICD-10-CM

## 2016-08-08 DIAGNOSIS — Z820 Family history of epilepsy and other diseases of the nervous system: Secondary | ICD-10-CM | POA: Diagnosis not present

## 2016-08-08 DIAGNOSIS — Z86010 Personal history of colonic polyps: Secondary | ICD-10-CM | POA: Diagnosis not present

## 2016-08-08 DIAGNOSIS — D589 Hereditary hemolytic anemia, unspecified: Secondary | ICD-10-CM | POA: Diagnosis not present

## 2016-08-08 DIAGNOSIS — Z803 Family history of malignant neoplasm of breast: Secondary | ICD-10-CM | POA: Diagnosis not present

## 2016-08-08 DIAGNOSIS — D696 Thrombocytopenia, unspecified: Secondary | ICD-10-CM | POA: Diagnosis not present

## 2016-08-08 LAB — RETICULOCYTES
RBC.: 3.43 MIL/uL — AB (ref 3.87–5.11)
RETIC COUNT ABSOLUTE: 75.5 10*3/uL (ref 19.0–186.0)
RETIC CT PCT: 2.2 % (ref 0.4–3.1)

## 2016-08-08 LAB — CBC WITH DIFFERENTIAL/PLATELET
BASOS PCT: 0 %
Basophils Absolute: 0 10*3/uL (ref 0.0–0.1)
EOS PCT: 0 %
Eosinophils Absolute: 0 10*3/uL (ref 0.0–0.7)
HEMATOCRIT: 34.3 % — AB (ref 36.0–46.0)
Hemoglobin: 12.5 g/dL (ref 12.0–15.0)
LYMPHS PCT: 19 %
Lymphs Abs: 0.8 10*3/uL (ref 0.7–4.0)
MCH: 36.4 pg — ABNORMAL HIGH (ref 26.0–34.0)
MCHC: 36.4 g/dL — AB (ref 30.0–36.0)
MCV: 100 fL (ref 78.0–100.0)
MONO ABS: 0.6 10*3/uL (ref 0.1–1.0)
MONOS PCT: 14 %
NEUTROS ABS: 2.8 10*3/uL (ref 1.7–7.7)
Neutrophils Relative %: 67 %
PLATELETS: 156 10*3/uL (ref 150–400)
RBC: 3.43 MIL/uL — ABNORMAL LOW (ref 3.87–5.11)
RDW: 12.4 % (ref 11.5–15.5)
WBC: 4.2 10*3/uL (ref 4.0–10.5)

## 2016-08-08 LAB — COMPREHENSIVE METABOLIC PANEL
ALT: 21 U/L (ref 14–54)
ANION GAP: 8 (ref 5–15)
AST: 22 U/L (ref 15–41)
Albumin: 3.7 g/dL (ref 3.5–5.0)
Alkaline Phosphatase: 598 U/L — ABNORMAL HIGH (ref 38–126)
BUN: 14 mg/dL (ref 6–20)
CHLORIDE: 106 mmol/L (ref 101–111)
CO2: 25 mmol/L (ref 22–32)
Calcium: 8.8 mg/dL — ABNORMAL LOW (ref 8.9–10.3)
Creatinine, Ser: 1.32 mg/dL — ABNORMAL HIGH (ref 0.44–1.00)
GFR, EST AFRICAN AMERICAN: 50 mL/min — AB (ref >60)
GFR, EST NON AFRICAN AMERICAN: 43 mL/min — AB (ref >60)
Glucose, Bld: 93 mg/dL (ref 65–99)
POTASSIUM: 3.8 mmol/L (ref 3.5–5.1)
Sodium: 139 mmol/L (ref 135–145)
TOTAL PROTEIN: 6.2 g/dL — AB (ref 6.5–8.1)
Total Bilirubin: 0.5 mg/dL (ref 0.3–1.2)

## 2016-08-08 LAB — LACTATE DEHYDROGENASE: LDH: 238 U/L — AB (ref 98–192)

## 2016-08-25 ENCOUNTER — Encounter (HOSPITAL_COMMUNITY): Payer: Self-pay | Admitting: Oncology

## 2016-08-25 ENCOUNTER — Encounter (HOSPITAL_COMMUNITY): Payer: Federal, State, Local not specified - PPO | Attending: Oncology | Admitting: Oncology

## 2016-08-25 ENCOUNTER — Encounter (HOSPITAL_COMMUNITY): Payer: Federal, State, Local not specified - PPO

## 2016-08-25 VITALS — BP 110/68 | HR 66 | Temp 97.9°F | Resp 18 | Wt 172.1 lb

## 2016-08-25 DIAGNOSIS — Z8673 Personal history of transient ischemic attack (TIA), and cerebral infarction without residual deficits: Secondary | ICD-10-CM | POA: Diagnosis not present

## 2016-08-25 DIAGNOSIS — B20 Human immunodeficiency virus [HIV] disease: Secondary | ICD-10-CM | POA: Insufficient documentation

## 2016-08-25 DIAGNOSIS — Z9889 Other specified postprocedural states: Secondary | ICD-10-CM | POA: Insufficient documentation

## 2016-08-25 DIAGNOSIS — Z86010 Personal history of colonic polyps: Secondary | ICD-10-CM | POA: Insufficient documentation

## 2016-08-25 DIAGNOSIS — D591 Other autoimmune hemolytic anemias: Secondary | ICD-10-CM | POA: Insufficient documentation

## 2016-08-25 DIAGNOSIS — K648 Other hemorrhoids: Secondary | ICD-10-CM | POA: Diagnosis not present

## 2016-08-25 DIAGNOSIS — D5919 Other autoimmune hemolytic anemia: Secondary | ICD-10-CM

## 2016-08-25 DIAGNOSIS — E7800 Pure hypercholesterolemia, unspecified: Secondary | ICD-10-CM | POA: Diagnosis not present

## 2016-08-25 DIAGNOSIS — E876 Hypokalemia: Secondary | ICD-10-CM | POA: Insufficient documentation

## 2016-08-25 LAB — COMPREHENSIVE METABOLIC PANEL
ALT: 24 U/L (ref 14–54)
AST: 27 U/L (ref 15–41)
Albumin: 4 g/dL (ref 3.5–5.0)
Alkaline Phosphatase: 228 U/L — ABNORMAL HIGH (ref 38–126)
Anion gap: 7 (ref 5–15)
BUN: 11 mg/dL (ref 6–20)
CHLORIDE: 106 mmol/L (ref 101–111)
CO2: 26 mmol/L (ref 22–32)
CREATININE: 1.18 mg/dL — AB (ref 0.44–1.00)
Calcium: 9.3 mg/dL (ref 8.9–10.3)
GFR calc non Af Amer: 49 mL/min — ABNORMAL LOW (ref >60)
GFR, EST AFRICAN AMERICAN: 57 mL/min — AB (ref >60)
Glucose, Bld: 86 mg/dL (ref 65–99)
POTASSIUM: 3.3 mmol/L — AB (ref 3.5–5.1)
SODIUM: 139 mmol/L (ref 135–145)
Total Bilirubin: 0.6 mg/dL (ref 0.3–1.2)
Total Protein: 6.5 g/dL (ref 6.5–8.1)

## 2016-08-25 LAB — CBC WITH DIFFERENTIAL/PLATELET
BASOS ABS: 0 10*3/uL (ref 0.0–0.1)
Basophils Relative: 0 %
EOS ABS: 0 10*3/uL (ref 0.0–0.7)
EOS PCT: 0 %
HCT: 37 % (ref 36.0–46.0)
Hemoglobin: 12.9 g/dL (ref 12.0–15.0)
Lymphocytes Relative: 18 %
Lymphs Abs: 0.7 10*3/uL (ref 0.7–4.0)
MCH: 34.8 pg — ABNORMAL HIGH (ref 26.0–34.0)
MCHC: 34.9 g/dL (ref 30.0–36.0)
MCV: 99.7 fL (ref 78.0–100.0)
Monocytes Absolute: 0.6 10*3/uL (ref 0.1–1.0)
Monocytes Relative: 14 %
Neutro Abs: 2.7 10*3/uL (ref 1.7–7.7)
Neutrophils Relative %: 68 %
PLATELETS: 173 10*3/uL (ref 150–400)
RBC: 3.71 MIL/uL — AB (ref 3.87–5.11)
RDW: 12.3 % (ref 11.5–15.5)
WBC: 4 10*3/uL (ref 4.0–10.5)

## 2016-08-25 LAB — LACTATE DEHYDROGENASE: LDH: 206 U/L — ABNORMAL HIGH (ref 98–192)

## 2016-08-25 LAB — RETICULOCYTES
RBC.: 3.71 MIL/uL — AB (ref 3.87–5.11)
RETIC COUNT ABSOLUTE: 70.5 10*3/uL (ref 19.0–186.0)
RETIC CT PCT: 1.9 % (ref 0.4–3.1)

## 2016-08-25 MED ORDER — POTASSIUM CHLORIDE CRYS ER 20 MEQ PO TBCR: 20.0000 meq | EXTENDED_RELEASE_TABLET | Freq: Two times a day (BID) | ORAL | 0 refills | Status: DC

## 2016-08-25 NOTE — Patient Instructions (Addendum)
[place] at [facility]  Discharge Instructions:   Exam and discussion by Kirby Crigler PA today Potassium prescription sent to the pharmacy. Labs every 3 weeks Return to see the doctor in 9 weeks  Please call the clinic if you have any questions or concerns    _______________________________________________________________  Thank you for choosing [place] at [facility] to provide your oncology and hematology care.  To afford each patient quality time with our providers, please arrive at least 15 minutes before your scheduled appointment.  You need to re-schedule your appointment if you arrive 10 or more minutes late.  We strive to give you quality time with our providers, and arriving late affects you and other patients whose appointments are after yours.  Also, if you no show three or more times for appointments you may be dismissed from the clinic.  Again, thank you for choosing [place] at [place] hope is that these requests will allow you access to exceptional care and in a timely manner. _______________________________________________________________  If you have questions after your visit, please contact our office at [phone] between the hours of 8:30 a.m. and 5:00 p.m. Voicemails left after 4:30 p.m. will not be returned until the following business day. _______________________________________________________________  For prescription refill requests, have your pharmacy contact our office. _______________________________________________________________  Recommendations made by the consultant and any test results will be sent to your referring physician. _______________________________________________________________

## 2016-08-25 NOTE — Assessment & Plan Note (Addendum)
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 Megan Salon (ID).  S/P 4 cycles of weekly Rituxan 07/03/2016- 07/25/2016.  Labs today: CBC diff, CMET, haptoglobin, LDH, retic count.  I personally reviewed and went over laboratory results with the patient.  The results are noted within this dictation.   CBC is WNL.  Hypokalemia is noted.    I have refilled her K+.  She provides me an insurance form requesting some information from the clinic.  I am not aware of this information and therefore, I will provide a copy of this information to Angie for further evaluation.  A copy is also today's dictation.  Labs every 3 weeks: CBC diff, CMET, LDH, retic count, haptoglobin.  Return in 9 weeks for follow-up. If remission remains, then we can consider decreasing frequency of lab checks and follow-up appointments.

## 2016-08-25 NOTE — Progress Notes (Signed)
Theresa Morrison, [place] [id number]  Other autoimmune hemolytic anemias ([place]) - Plan: CBC with Differential, Comprehensive metabolic panel, Lactate dehydrogenase, Haptoglobin, Reticulocytes  Hypokalemia - Plan: potassium chloride SA (KLOR-CON M20) 20 MEQ tablet  CURRENT THERAPY: Surveillance  INTERVAL HISTORY: Theresa Morrison 61 y.o. 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 Megan Salon (ID).   S/P 4 cycles of weekly Rituxan 07/03/2016- 07/25/2016.  She is doing well.  She has completed her Prednisone taper.  She denies any hematologic complaints today.  She reports that her son has to go to [facility] to meet with his pastor regarding his upcoming sex change.  "They are going to try to brain wash him."  Review of Systems  Constitutional: Negative.  Negative for chills, fever and weight loss.  HENT: Negative.   Eyes: Negative.   Respiratory: Negative.  Negative for cough.   Cardiovascular: Negative.  Negative for chest pain.  Gastrointestinal: Negative.  Negative for constipation, diarrhea, nausea and vomiting.  Genitourinary: Negative.   Musculoskeletal: Negative.   Skin: Negative.   Neurological: Negative.  Negative for weakness.  Endo/Heme/Allergies: Negative.     Past Medical History:  Diagnosis Date  . Anemia   . Colon polyps    adenomatous  . CVA (cerebral vascular accident) ([place])   . Headache   . HIV positive ([place]) 1987  . Hypercholesterolemia   . Hyperlipidemia   . Internal hemorrhoids   . Shingles   . Toxoplasmosis     Past Surgical History:  Procedure Laterality Date  . APPENDECTOMY  2001  . KNEE ARTHROSCOPY Bilateral   . SHOULDER SURGERY Right   . TONSILLECTOMY      Family History  Problem Relation Age of Onset  . Heart attack Father   . High Cholesterol Father   . Breast cancer Mother     mets  to lung and brain  . Cancer Mother   . Prostate cancer Brother   . Stroke Neg Hx   . Migraines Neg Hx   . Neuropathy Neg Hx     Social History   Social History  . Marital status: Married    Spouse name: [name]  . Number of children: 2  . Years of education: 12   Occupational History  .  Unemployed   Social History Main Topics  . Smoking status: Never Smoker  . Smokeless tobacco: Never Used  . Alcohol use No  . Drug use: No  . Sexual activity: Not Currently     Comment: declined condoms   Other Topics Concern  . None   Social History Narrative   Lives with spouse and son   Caffeine use: 12oz daily     PHYSICAL EXAMINATION  ECOG PERFORMANCE STATUS: 0 - Asymptomatic  Vitals:   08/25/16 1000  BP: 110/68  Pulse: 66  Resp: 18  Temp: 97.9 F (36.6 C)    GENERAL:alert, no distress, well nourished, well developed, comfortable, cooperative, obese, smiling and unaccompanied. SKIN: skin color, texture, turgor are normal, no rashes or significant lesions HEAD: Normocephalic, No masses, lesions, tenderness or abnormalities EYES: normal, EOMI, Conjunctiva are pink and non-injected EARS: External ears normal OROPHARYNX:lips, buccal mucosa, and tongue normal and mucous membranes are moist  NECK: supple, trachea midline LYMPH:  no palpable lymphadenopathy BREAST:not examined LUNGS: clear to auscultation  HEART: regular rate & rhythm ABDOMEN:abdomen  soft and obese BACK: Back symmetric, no curvature. EXTREMITIES:less then 2 second capillary refill, no joint deformities, effusion, or inflammation, no skin discoloration, no cyanosis  NEURO: alert & oriented x 3 with fluent speech, no focal motor/sensory deficits, gait normal    LABORATORY DATA: CBC    Component Value Date/Time   WBC 4.0 08/25/2016 0942   RBC 3.71 (L) 08/25/2016 0942   RBC 3.71 (L) 08/25/2016 0942   HGB 12.9 08/25/2016 0942   HCT 37.0 08/25/2016 0942   PLT 173 08/25/2016 0942   MCV 99.7 08/25/2016  0942   MCH 34.8 (H) 08/25/2016 0942   MCHC 34.9 08/25/2016 0942   RDW 12.3 08/25/2016 0942   LYMPHSABS 0.7 08/25/2016 0942   MONOABS 0.6 08/25/2016 0942   EOSABS 0.0 08/25/2016 0942   BASOSABS 0.0 08/25/2016 0942      Chemistry      Component Value Date/Time   NA 139 08/25/2016 0942   K 3.3 (L) 08/25/2016 0942   CL 106 08/25/2016 0942   CO2 26 08/25/2016 0942   BUN 11 08/25/2016 0942   CREATININE 1.18 (H) 08/25/2016 0942   CREATININE 1.20 (H) 04/06/2016 0925      Component Value Date/Time   CALCIUM 9.3 08/25/2016 0942   ALKPHOS 228 (H) 08/25/2016 0942   AST 27 08/25/2016 0942   ALT 24 08/25/2016 0942   BILITOT 0.6 08/25/2016 0942        PENDING LABS:   RADIOGRAPHIC STUDIES:  No results found.   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 Megan Salon (ID).  S/P 4 cycles of weekly Rituxan 07/03/2016- 07/25/2016.  Labs today: CBC diff, CMET, haptoglobin, LDH, retic count.  I personally reviewed and went over laboratory results with the patient.  The results are noted within this dictation.   CBC is WNL.  Hypokalemia is noted.    I have refilled her K+.  She provides me an insurance form requesting some information from the clinic.  I am not aware of this information and therefore, I will provide a copy of this information to Angie for further evaluation.  A copy is also today's dictation.  Labs every 3 weeks: CBC diff, CMET, LDH, retic count, haptoglobin.  Return in 9 weeks for follow-up. If remission remains, then we can consider decreasing frequency of lab checks and follow-up appointments.   ORDERS PLACED FOR THIS ENCOUNTER: Orders Placed This Encounter  Procedures  . CBC with Differential  . Comprehensive metabolic panel  . Lactate dehydrogenase  . Haptoglobin  . Reticulocytes     MEDICATIONS PRESCRIBED THIS ENCOUNTER: Meds ordered this encounter  Medications  . potassium chloride SA (KLOR-CON M20) 20 MEQ tablet    Sig: Take 1 tablet (20 mEq total) by mouth 2 (two) times daily.    Dispense:  60 tablet    Refill:  0    Order Specific Question:   Supervising Provider    Answer:   Brunetta Genera [id number]    THERAPY PLAN:  Ongoing surveillance  All questions were answered. The patient knows to call the clinic with any problems, questions or concerns. We can certainly see the patient much sooner if necessary.  Patient and plan discussed with Dr. Twana First and she is in agreement with the aforementioned.   This note is electronically signed by: Doy Mince 08/25/2016 10:34 AM

## 2016-08-29 DIAGNOSIS — E782 Mixed hyperlipidemia: Secondary | ICD-10-CM | POA: Diagnosis not present

## 2016-08-29 DIAGNOSIS — Z1389 Encounter for screening for other disorder: Secondary | ICD-10-CM | POA: Diagnosis not present

## 2016-08-29 DIAGNOSIS — E441 Mild protein-calorie malnutrition: Secondary | ICD-10-CM | POA: Diagnosis not present

## 2016-08-29 DIAGNOSIS — D590 Drug-induced autoimmune hemolytic anemia: Secondary | ICD-10-CM | POA: Diagnosis not present

## 2016-08-29 DIAGNOSIS — Z6830 Body mass index (BMI) 30.0-30.9, adult: Secondary | ICD-10-CM | POA: Diagnosis not present

## 2016-09-15 ENCOUNTER — Other Ambulatory Visit (HOSPITAL_COMMUNITY)
Admission: RE | Admit: 2016-09-15 | Discharge: 2016-09-15 | Disposition: A | Discharge status: Home / Self Care | Payer: Federal, State, Local not specified - PPO | Source: Ambulatory Visit | Attending: Family Medicine | Admitting: Family Medicine

## 2016-09-15 ENCOUNTER — Encounter (HOSPITAL_COMMUNITY): Payer: Federal, State, Local not specified - PPO

## 2016-09-15 DIAGNOSIS — B20 Human immunodeficiency virus [HIV] disease: Secondary | ICD-10-CM | POA: Insufficient documentation

## 2016-09-15 DIAGNOSIS — D591 Other autoimmune hemolytic anemias: Secondary | ICD-10-CM | POA: Diagnosis not present

## 2016-09-15 DIAGNOSIS — Z1389 Encounter for screening for other disorder: Secondary | ICD-10-CM | POA: Insufficient documentation

## 2016-09-15 DIAGNOSIS — K648 Other hemorrhoids: Secondary | ICD-10-CM | POA: Diagnosis not present

## 2016-09-15 DIAGNOSIS — Z9889 Other specified postprocedural states: Secondary | ICD-10-CM | POA: Diagnosis not present

## 2016-09-15 DIAGNOSIS — Z0001 Encounter for general adult medical examination with abnormal findings: Secondary | ICD-10-CM | POA: Diagnosis not present

## 2016-09-15 DIAGNOSIS — Z8673 Personal history of transient ischemic attack (TIA), and cerebral infarction without residual deficits: Secondary | ICD-10-CM | POA: Diagnosis not present

## 2016-09-15 DIAGNOSIS — E876 Hypokalemia: Secondary | ICD-10-CM | POA: Diagnosis not present

## 2016-09-15 DIAGNOSIS — E7800 Pure hypercholesterolemia, unspecified: Secondary | ICD-10-CM | POA: Diagnosis not present

## 2016-09-15 DIAGNOSIS — D5919 Other autoimmune hemolytic anemia: Secondary | ICD-10-CM

## 2016-09-15 DIAGNOSIS — Z86010 Personal history of colonic polyps: Secondary | ICD-10-CM | POA: Diagnosis not present

## 2016-09-15 LAB — RETICULOCYTES
RBC.: 3.68 MIL/uL — AB (ref 3.87–5.11)
Retic Count, Absolute: 58.9 10*3/uL (ref 19.0–186.0)
Retic Ct Pct: 1.6 % (ref 0.4–3.1)

## 2016-09-15 LAB — LIPID PANEL
Cholesterol: 158 mg/dL (ref 0–200)
HDL: 44 mg/dL (ref >40)
LDL CALC: 89 mg/dL (ref 0–99)
TRIGLYCERIDES: 123 mg/dL (ref <150)
Total CHOL/HDL Ratio: 3.6 RATIO
VLDL: 25 mg/dL (ref 0–40)

## 2016-09-15 LAB — COMPREHENSIVE METABOLIC PANEL
ALK PHOS: 131 U/L — AB (ref 38–126)
ALT: 25 U/L (ref 14–54)
AST: 29 U/L (ref 15–41)
Albumin: 4.2 g/dL (ref 3.5–5.0)
Anion gap: 7 (ref 5–15)
BUN: 11 mg/dL (ref 6–20)
CALCIUM: 9.2 mg/dL (ref 8.9–10.3)
CO2: 26 mmol/L (ref 22–32)
CREATININE: 1.21 mg/dL — AB (ref 0.44–1.00)
Chloride: 108 mmol/L (ref 101–111)
GFR calc non Af Amer: 48 mL/min — ABNORMAL LOW (ref >60)
GFR, EST AFRICAN AMERICAN: 56 mL/min — AB (ref >60)
Glucose, Bld: 85 mg/dL (ref 65–99)
Potassium: 3.5 mmol/L (ref 3.5–5.1)
SODIUM: 141 mmol/L (ref 135–145)
Total Bilirubin: 0.5 mg/dL (ref 0.3–1.2)
Total Protein: 6.4 g/dL — ABNORMAL LOW (ref 6.5–8.1)

## 2016-09-15 LAB — CBC WITH DIFFERENTIAL/PLATELET
BASOS ABS: 0 10*3/uL (ref 0.0–0.1)
Basophils Relative: 0 %
EOS ABS: 0 10*3/uL (ref 0.0–0.7)
Eosinophils Relative: 0 %
HCT: 36.7 % (ref 36.0–46.0)
HEMOGLOBIN: 12.9 g/dL (ref 12.0–15.0)
LYMPHS ABS: 0.9 10*3/uL (ref 0.7–4.0)
LYMPHS PCT: 26 %
MCH: 35.1 pg — AB (ref 26.0–34.0)
MCHC: 35.1 g/dL (ref 30.0–36.0)
MCV: 99.7 fL (ref 78.0–100.0)
Monocytes Absolute: 0.4 10*3/uL (ref 0.1–1.0)
Monocytes Relative: 12 %
NEUTROS PCT: 62 %
Neutro Abs: 2 10*3/uL (ref 1.7–7.7)
Platelets: 157 10*3/uL (ref 150–400)
RBC: 3.68 MIL/uL — AB (ref 3.87–5.11)
RDW: 12.3 % (ref 11.5–15.5)
WBC: 3.3 10*3/uL — AB (ref 4.0–10.5)

## 2016-09-15 LAB — LACTATE DEHYDROGENASE: LDH: 178 U/L (ref 98–192)

## 2016-09-15 LAB — TSH: TSH: 2.813 u[IU]/mL (ref 0.350–4.500)

## 2016-09-16 LAB — HAPTOGLOBIN: HAPTOGLOBIN: 125 mg/dL (ref 34–200)

## 2016-09-21 ENCOUNTER — Other Ambulatory Visit (HOSPITAL_COMMUNITY): Payer: Self-pay | Admitting: Oncology

## 2016-09-21 DIAGNOSIS — E876 Hypokalemia: Secondary | ICD-10-CM

## 2016-10-06 ENCOUNTER — Encounter (HOSPITAL_COMMUNITY): Payer: Federal, State, Local not specified - PPO | Attending: Oncology

## 2016-10-06 DIAGNOSIS — D5919 Other autoimmune hemolytic anemia: Secondary | ICD-10-CM

## 2016-10-06 DIAGNOSIS — D591 Other autoimmune hemolytic anemias: Secondary | ICD-10-CM | POA: Diagnosis not present

## 2016-10-06 LAB — CBC WITH DIFFERENTIAL/PLATELET
BASOS ABS: 0 10*3/uL (ref 0.0–0.1)
Basophils Relative: 0 %
Eosinophils Absolute: 0 10*3/uL (ref 0.0–0.7)
Eosinophils Relative: 0 %
HEMATOCRIT: 36.3 % (ref 36.0–46.0)
HEMOGLOBIN: 12.7 g/dL (ref 12.0–15.0)
LYMPHS PCT: 27 %
Lymphs Abs: 0.8 10*3/uL (ref 0.7–4.0)
MCH: 34.7 pg — ABNORMAL HIGH (ref 26.0–34.0)
MCHC: 35 g/dL (ref 30.0–36.0)
MCV: 99.2 fL (ref 78.0–100.0)
MONO ABS: 0.3 10*3/uL (ref 0.1–1.0)
MONOS PCT: 12 %
NEUTROS ABS: 1.7 10*3/uL (ref 1.7–7.7)
NEUTROS PCT: 61 %
Platelets: 143 10*3/uL — ABNORMAL LOW (ref 150–400)
RBC: 3.66 MIL/uL — ABNORMAL LOW (ref 3.87–5.11)
RDW: 12 % (ref 11.5–15.5)
WBC: 2.8 10*3/uL — ABNORMAL LOW (ref 4.0–10.5)

## 2016-10-06 LAB — RETICULOCYTES
RBC.: 3.66 MIL/uL — AB (ref 3.87–5.11)
RETIC COUNT ABSOLUTE: 43.9 10*3/uL (ref 19.0–186.0)
Retic Ct Pct: 1.2 % (ref 0.4–3.1)

## 2016-10-06 LAB — COMPREHENSIVE METABOLIC PANEL
ALBUMIN: 4.1 g/dL (ref 3.5–5.0)
ALK PHOS: 114 U/L (ref 38–126)
ALT: 30 U/L (ref 14–54)
AST: 33 U/L (ref 15–41)
Anion gap: 7 (ref 5–15)
BUN: 12 mg/dL (ref 6–20)
CO2: 26 mmol/L (ref 22–32)
Calcium: 9 mg/dL (ref 8.9–10.3)
Chloride: 108 mmol/L (ref 101–111)
Creatinine, Ser: 1.26 mg/dL — ABNORMAL HIGH (ref 0.44–1.00)
GFR calc Af Amer: 53 mL/min — ABNORMAL LOW (ref >60)
GFR calc non Af Amer: 46 mL/min — ABNORMAL LOW (ref >60)
Glucose, Bld: 85 mg/dL (ref 65–99)
POTASSIUM: 3.4 mmol/L — AB (ref 3.5–5.1)
Sodium: 141 mmol/L (ref 135–145)
Total Bilirubin: 0.5 mg/dL (ref 0.3–1.2)
Total Protein: 6.2 g/dL — ABNORMAL LOW (ref 6.5–8.1)

## 2016-10-06 LAB — LACTATE DEHYDROGENASE: LDH: 164 U/L (ref 98–192)

## 2016-10-07 LAB — HAPTOGLOBIN: HAPTOGLOBIN: 107 mg/dL (ref 34–200)

## 2016-10-31 ENCOUNTER — Encounter (HOSPITAL_COMMUNITY): Payer: Self-pay

## 2016-10-31 ENCOUNTER — Encounter (HOSPITAL_COMMUNITY): Payer: Federal, State, Local not specified - PPO

## 2016-10-31 ENCOUNTER — Encounter (HOSPITAL_COMMUNITY): Payer: Federal, State, Local not specified - PPO | Attending: Oncology | Admitting: Oncology

## 2016-10-31 VITALS — BP 115/61 | HR 56 | Temp 97.8°F | Resp 16 | Wt 173.6 lb

## 2016-10-31 DIAGNOSIS — D5919 Other autoimmune hemolytic anemia: Secondary | ICD-10-CM

## 2016-10-31 DIAGNOSIS — R42 Dizziness and giddiness: Secondary | ICD-10-CM | POA: Diagnosis not present

## 2016-10-31 DIAGNOSIS — B20 Human immunodeficiency virus [HIV] disease: Secondary | ICD-10-CM

## 2016-10-31 DIAGNOSIS — D591 Other autoimmune hemolytic anemias: Secondary | ICD-10-CM | POA: Diagnosis not present

## 2016-10-31 LAB — COMPREHENSIVE METABOLIC PANEL
ALK PHOS: 128 U/L — AB (ref 38–126)
ALT: 28 U/L (ref 14–54)
AST: 31 U/L (ref 15–41)
Albumin: 4.2 g/dL (ref 3.5–5.0)
Anion gap: 7 (ref 5–15)
BILIRUBIN TOTAL: 0.6 mg/dL (ref 0.3–1.2)
BUN: 16 mg/dL (ref 6–20)
CALCIUM: 9.3 mg/dL (ref 8.9–10.3)
CHLORIDE: 105 mmol/L (ref 101–111)
CO2: 27 mmol/L (ref 22–32)
CREATININE: 1.18 mg/dL — AB (ref 0.44–1.00)
GFR, EST AFRICAN AMERICAN: 57 mL/min — AB (ref >60)
GFR, EST NON AFRICAN AMERICAN: 49 mL/min — AB (ref >60)
Glucose, Bld: 85 mg/dL (ref 65–99)
Potassium: 3.5 mmol/L (ref 3.5–5.1)
Sodium: 139 mmol/L (ref 135–145)
TOTAL PROTEIN: 6.5 g/dL (ref 6.5–8.1)

## 2016-10-31 LAB — CBC WITH DIFFERENTIAL/PLATELET
Basophils Absolute: 0 10*3/uL (ref 0.0–0.1)
Basophils Relative: 0 %
EOS PCT: 3 %
Eosinophils Absolute: 0.1 10*3/uL (ref 0.0–0.7)
HEMATOCRIT: 38.2 % (ref 36.0–46.0)
Hemoglobin: 13.2 g/dL (ref 12.0–15.0)
LYMPHS ABS: 0.7 10*3/uL (ref 0.7–4.0)
LYMPHS PCT: 27 %
MCH: 33.8 pg (ref 26.0–34.0)
MCHC: 34.6 g/dL (ref 30.0–36.0)
MCV: 97.9 fL (ref 78.0–100.0)
Monocytes Absolute: 0.4 10*3/uL (ref 0.1–1.0)
Monocytes Relative: 15 %
NEUTROS ABS: 1.5 10*3/uL — AB (ref 1.7–7.7)
Neutrophils Relative %: 55 %
PLATELETS: 143 10*3/uL — AB (ref 150–400)
RBC: 3.9 MIL/uL (ref 3.87–5.11)
RDW: 12.2 % (ref 11.5–15.5)
WBC: 2.7 10*3/uL — AB (ref 4.0–10.5)

## 2016-10-31 LAB — RETICULOCYTES
RBC.: 3.9 MIL/uL (ref 3.87–5.11)
RETIC CT PCT: 1.1 % (ref 0.4–3.1)
Retic Count, Absolute: 42.9 10*3/uL (ref 19.0–186.0)

## 2016-10-31 LAB — LACTATE DEHYDROGENASE: LDH: 169 U/L (ref 98–192)

## 2016-10-31 NOTE — Progress Notes (Signed)
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 female is here for follow-up of a history of hemolytic anemia. She had relapsed disease and was placed on single agent rituxan and prednisone. Her hemolytic anemia is now controlled again.   She is doing well today. She has had vertigo since February. She has seen a neurologist, but they aren't sure what is causing it. She takes Valium when needed to help with the vertigo. Denies chest pain, SOB, fatigue, abdominal pain, loss of appetite, or any other concerns.   MEDICAL HISTORY:  Past Medical History:  Diagnosis Date  . Anemia   . Colon polyps    adenomatous  . CVA (cerebral vascular accident) ([place])   . Headache   . HIV positive ([place]) 1987  . Hypercholesterolemia   . Hyperlipidemia   . Internal hemorrhoids   . Shingles   . Toxoplasmosis     SURGICAL HISTORY: Past Surgical History:  Procedure Laterality Date  . APPENDECTOMY  2001  . KNEE ARTHROSCOPY Bilateral   . SHOULDER SURGERY Right   . TONSILLECTOMY      SOCIAL HISTORY: Social History   Social History  . Marital status: Married    Spouse name: [name]  . Number of children: 2  . Years of education: 12   Occupational History  .  Unemployed   Social History Main Topics  . Smoking status:  Never Smoker  . Smokeless tobacco: Never Used  . Alcohol use No  . Drug use: No  . Sexual activity: Not Currently     Comment: declined condoms   Other Topics Concern  . Not on file   Social History Narrative   Lives with spouse and son   Caffeine use: 12oz daily    FAMILY HISTORY: Family History  Problem Relation Age of Onset  . Heart attack Father   . High Cholesterol Father   . Breast cancer Mother     mets to lung and brain  . Cancer Mother   . Prostate cancer Brother   . Stroke Neg Hx   . Migraines Neg Hx   . Neuropathy Neg Hx    indicated that her mother is deceased. She indicated that her father is deceased. She indicated that the status of her brother is unknown. She indicated that the status of her neg hx is unknown.    Mother died at 50 from breast cancer Father died at 66 from MI 1 sister, 3 brothers all healthy  ALLERGIES:  is allergic to penicillins; stavudine; and sulfamethoxazole-trimethoprim.  MEDICATIONS:  Current Outpatient Prescriptions  Medication Sig Dispense Refill  . acetaminophen (TYLENOL) 500 MG tablet Take 500 mg by mouth every 6 (six) hours as needed.    Marland Kitchen aspirin 81 MG tablet Take 81 mg by mouth daily.      Marland Kitchen atorvastatin (LIPITOR) 10 MG tablet Take 10 mg by mouth daily.    . beta carotene  w/minerals (OCUVITE) tablet Take 2 tablets by mouth daily.    . calcium carbonate (OS-CAL) 600 MG TABS Take 1,200 mg by mouth daily.     . Cholecalciferol (VITAMIN D-3) 1000 units CAPS Take 1,000 Units by mouth daily.    . clopidogrel (PLAVIX) 75 MG tablet Take 1 tablet (75 mg total) by mouth daily. 90 tablet 3  . diazepam (VALIUM) 2 MG tablet Take 1 tablet (2 mg total) by mouth every 6 (six) hours as needed for vertigo. May try 1/2 pill as well. 20 tablet 4  . KLOR-CON M20 20 MEQ tablet TAKE 1 TABLET BY MOUTH TWICE A DAY 60 tablet 0  . Omega-3 Fatty Acids (FISH OIL) 1000 MG CAPS Take 1 capsule by mouth daily.     . TRIUMEQ 600-50-300 MG tablet TAKE 1  TABLET DAILY 90 tablet 1  . zolpidem (AMBIEN) 10 MG tablet Take 1/2-1 tablet at bedtime as needed for trouble sleeping 30 tablet 3  . ondansetron (ZOFRAN) 8 MG tablet Take 1 tablet (8 mg total) by mouth every 8 (eight) hours as needed for nausea or vomiting. (Patient not taking: Reported on 10/31/2016) 30 tablet 2  . prochlorperazine (COMPAZINE) 10 MG tablet Take 1 tablet (10 mg total) by mouth every 6 (six) hours as needed for nausea or vomiting. (Patient not taking: Reported on 10/31/2016) 30 tablet 2   No current facility-administered medications for this visit.    Review of Systems  Constitutional: Negative.  Negative for malaise/fatigue.       No loss of appetite  HENT: Negative.   Eyes: Negative.   Respiratory: Negative.  Negative for shortness of breath.   Cardiovascular: Negative.  Negative for chest pain.  Gastrointestinal: Negative.  Negative for abdominal pain.  Genitourinary: Negative.   Musculoskeletal: Negative.   Skin: Negative.   Neurological: Positive for dizziness (vertigo).  Endo/Heme/Allergies: Negative.   Psychiatric/Behavioral: Negative.   All other systems reviewed and are negative.  14 point review of systems was performed and is negative except as detailed under history of present illness and above  PHYSICAL EXAMINATION: ECOG PERFORMANCE STATUS: 1 - Symptomatic but completely ambulatory  Vitals:   10/31/16 1013  BP: 115/61  Pulse: (!) 56  Resp: 16  Temp: 97.8 F (36.6 C)   Filed Weights   10/31/16 1013  Weight: 173 lb 9.6 oz (78.7 kg)    Physical Exam  Constitutional: She is oriented to person, place, and time and well-developed, well-nourished, and in no distress.  Pt was able to get on exam table without assistance.   HENT:  Head: Normocephalic and atraumatic.  Eyes: EOM are normal. Pupils are equal, round, and reactive to light. No scleral icterus.  Neck: Normal range of motion. Neck supple.  Cardiovascular: Normal rate, regular rhythm and  normal heart sounds.   Pulmonary/Chest: Effort normal and breath sounds normal.  Abdominal: Soft. Bowel sounds are normal. She exhibits no distension. There is no tenderness.  Musculoskeletal: Normal range of motion. She exhibits no edema.  Lymphadenopathy:    She has no cervical adenopathy.  Neurological: She is alert and oriented to person, place, and time. No cranial nerve deficit. Gait normal.  Skin: Skin is warm and dry.  Psychiatric: Mood, memory, affect and judgment normal.  Nursing note and vitals reviewed.  LABORATORY DATA:  I have reviewed the data as listed CBC Latest Ref Rng & Units 10/31/2016 10/06/2016 09/15/2016  WBC 4.0 - 10.5 K/uL 2.7(L) 2.8(L) 3.3(L)  Hemoglobin 12.0 - 15.0 g/dL 13.2  12.7 12.9  Hematocrit 36.0 - 46.0 % 38.2 36.3 36.7  Platelets 150 - 400 K/uL 143(L) 143(L) 157   CMP Latest Ref Rng & Units 10/31/2016 10/06/2016 09/15/2016  Glucose 65 - 99 mg/dL 85 85 85  BUN 6 - 20 mg/dL 16 12 11   Creatinine 0.44 - 1.00 mg/dL 1.18(H) 1.26(H) 1.21(H)  Sodium 135 - 145 mmol/L 139 141 141  Potassium 3.5 - 5.1 mmol/L 3.5 3.4(L) 3.5  Chloride 101 - 111 mmol/L 105 108 108  CO2 22 - 32 mmol/L 27 26 26   Calcium 8.9 - 10.3 mg/dL 9.3 9.0 9.2  Total Protein 6.5 - 8.1 g/dL 6.5 6.2(L) 6.4(L)  Total Bilirubin 0.3 - 1.2 mg/dL 0.6 0.5 0.5  Alkaline Phos 38 - 126 U/L 128(H) 114 131(H)  AST 15 - 41 U/L 31 33 29  ALT 14 - 54 U/L 28 30 25    PATHOLOGY:   RADIOLOGY: I have personally reviewed the radiological images as listed and agreed with the findings in the report.  MRI Breast b/l 06/21/2016 IMPRESSION: No abnormal enhancement in either breast.  DEXA Scan 04/13/2015 BMD as determined from Femur Neck Left is 0.765 g/cm2 with a T-Score of -2.0. This patient is considered osteopenic according to [facility]) criteria. (L-4 was excluded due to advanced degenerative changes.)  World Health Organization [facility]) criteria for post-menopausal, Caucasian  Women: Normal:       T-score at or above -1 SD Osteopenia:   T-score between -1 and -2.5 SD Osteoporosis: T-score at or below -2.5 SD  ASSESSMENT & PLAN:  Coombs positive hemolytic anemia Steroid and rituxan responsive  I reviewed the labs with the patient in detail, she is not anemic and there's no evidence of hemolysis at this time.   I encouraged her to continue follow up with her neurologist for her vertigo.   She will return for follow up in 4 months with labs. She knows to come back sooner if she has any new symptoms related to relapsed hemolytic anemia.   Orders Placed This Encounter  Procedures  . CBC with Differential    Standing Status:   Future    Standing Expiration Date:   04/02/2017  . Comprehensive metabolic panel    Standing Status:   Future    Standing Expiration Date:   04/02/2017  . Lactate dehydrogenase    Standing Status:   Future    Standing Expiration Date:   04/02/2017  . Haptoglobin    Standing Status:   Future    Standing Expiration Date:   04/02/2017    All questions were answered. The patient knows to call the clinic with any problems, questions or concerns  This document serves as a record of services personally performed by Twana First, MD. It was created on her behalf by Martinique Casey, a trained medical scribe. The creation of this record is based on the scribe's personal observations and the provider's statements to them. This document has been checked and approved by the attending provider.  I have reviewed the above documentation for accuracy and completeness, and I agree with the above.  This note was electronically signed.  Martinique M Casey  10/31/2016 10:16 AM

## 2016-10-31 NOTE — Patient Instructions (Addendum)
[facility] at [facility] Discharge Instructions  RECOMMENDATIONS MADE BY THE CONSULTANT AND ANY TEST RESULTS WILL BE SENT TO YOUR REFERRING PHYSICIAN.  You were seen today by Dr. Louise Zhou Follow up in 4 months with lab work See Amy up front for appointments   Thank you for choosing [facility] at [facility] to provide your oncology and hematology care.  To afford each patient quality time with our provider, please arrive at least 15 minutes before your scheduled appointment time.    If you have a lab appointment with the Cancer Center please come in thru the  Main Entrance and check in at the main information desk  You need to re-schedule your appointment should you arrive 10 or more minutes late.  We strive to give you quality time with our providers, and arriving late affects you and other patients whose appointments are after yours.  Also, if you no show three or more times for appointments you may be dismissed from the clinic at the providers discretion.     Again, thank you for choosing [facility].  Our hope is that these requests will decrease the amount of time that you wait before being seen by our physicians.       _____________________________________________________________  Should you have questions after your visit to [facility], please contact our office at [phone] between the hours of 8:30 a.m. and 4:30 p.m.  Voicemails left after 4:30 p.m. will not be returned until the following business day.  For prescription refill requests, have your pharmacy contact our office.       Resources For Cancer Patients and their Caregivers ? American Cancer Society: Can assist with transportation, wigs, general needs, runs Look Good Feel Better.        1-888-227-6333 ? Cancer Care: Provides financial assistance, online support groups, medication/co-pay assistance.  1-800-813-HOPE (4673) ? [facility] Assists [place] cancer patients and their families through emotional , educational and financial support.  336-427-4357 ? [place] DSS Where to apply for food stamps, Medicaid and utility assistance. 336-342-1394 ? [facility]: Transportation to medical appointments. 336-347-2287 ? Social Security Administration: May apply for disability if have a Stage IV cancer. 336-342-7796 1-800-772-1213 ? [place] Aging, Disability and Transit Services: Assists with nutrition, care and transit needs. 336-349-2343  Cancer Center Support Programs: @10RELATIVEDAYS@ > Cancer Support Group  2nd Tuesday of the month 1pm-2pm, Journey Room  > Creative Journey  3rd Tuesday of the month 1130am-1pm, Journey Room  > Look Good Feel Better  1st Wednesday of the month 10am-12 noon, Journey Room (Call American Cancer Society to register 1-800-395-5775)    

## 2016-11-01 LAB — HAPTOGLOBIN: Haptoglobin: 113 mg/dL (ref 34–200)

## 2016-11-06 ENCOUNTER — Other Ambulatory Visit: Payer: Self-pay | Admitting: Internal Medicine

## 2016-11-06 DIAGNOSIS — B20 Human immunodeficiency virus [HIV] disease: Secondary | ICD-10-CM

## 2016-11-06 MED ORDER — ABACAVIR-DOLUTEGRAVIR-LAMIVUD 600-50-300 MG PO TABS: 1.0000 | ORAL_TABLET | Freq: Every day | ORAL | 3 refills | Status: DC

## 2017-01-29 DIAGNOSIS — R200 Anesthesia of skin: Secondary | ICD-10-CM | POA: Diagnosis not present

## 2017-01-29 DIAGNOSIS — Z6830 Body mass index (BMI) 30.0-30.9, adult: Secondary | ICD-10-CM | POA: Diagnosis not present

## 2017-01-29 DIAGNOSIS — E6609 Other obesity due to excess calories: Secondary | ICD-10-CM | POA: Diagnosis not present

## 2017-01-29 DIAGNOSIS — Z1389 Encounter for screening for other disorder: Secondary | ICD-10-CM | POA: Diagnosis not present

## 2017-01-29 DIAGNOSIS — G5602 Carpal tunnel syndrome, left upper limb: Secondary | ICD-10-CM | POA: Diagnosis not present

## 2017-02-21 DIAGNOSIS — G5602 Carpal tunnel syndrome, left upper limb: Secondary | ICD-10-CM | POA: Diagnosis not present

## 2017-02-21 DIAGNOSIS — R200 Anesthesia of skin: Secondary | ICD-10-CM | POA: Diagnosis not present

## 2017-02-21 DIAGNOSIS — M542 Cervicalgia: Secondary | ICD-10-CM | POA: Diagnosis not present

## 2017-02-26 DIAGNOSIS — H2512 Age-related nuclear cataract, left eye: Secondary | ICD-10-CM | POA: Diagnosis not present

## 2017-02-26 DIAGNOSIS — H25012 Cortical age-related cataract, left eye: Secondary | ICD-10-CM | POA: Diagnosis not present

## 2017-02-26 DIAGNOSIS — H2513 Age-related nuclear cataract, bilateral: Secondary | ICD-10-CM | POA: Diagnosis not present

## 2017-02-26 DIAGNOSIS — H3509 Other intraretinal microvascular abnormalities: Secondary | ICD-10-CM | POA: Diagnosis not present

## 2017-02-26 DIAGNOSIS — H25041 Posterior subcapsular polar age-related cataract, right eye: Secondary | ICD-10-CM | POA: Diagnosis not present

## 2017-02-26 DIAGNOSIS — H25013 Cortical age-related cataract, bilateral: Secondary | ICD-10-CM | POA: Diagnosis not present

## 2017-02-26 DIAGNOSIS — H353132 Nonexudative age-related macular degeneration, bilateral, intermediate dry stage: Secondary | ICD-10-CM | POA: Diagnosis not present

## 2017-02-28 DIAGNOSIS — G5622 Lesion of ulnar nerve, left upper limb: Secondary | ICD-10-CM | POA: Diagnosis not present

## 2017-02-28 DIAGNOSIS — R200 Anesthesia of skin: Secondary | ICD-10-CM | POA: Diagnosis not present

## 2017-02-28 DIAGNOSIS — M5412 Radiculopathy, cervical region: Secondary | ICD-10-CM | POA: Diagnosis not present

## 2017-03-01 ENCOUNTER — Other Ambulatory Visit: Payer: Self-pay | Admitting: Orthopedic Surgery

## 2017-03-02 ENCOUNTER — Encounter (HOSPITAL_COMMUNITY): Payer: Self-pay | Admitting: Oncology

## 2017-03-02 ENCOUNTER — Encounter (HOSPITAL_COMMUNITY): Payer: Federal, State, Local not specified - PPO | Attending: Oncology

## 2017-03-02 ENCOUNTER — Encounter (HOSPITAL_BASED_OUTPATIENT_CLINIC_OR_DEPARTMENT_OTHER): Payer: Federal, State, Local not specified - PPO | Admitting: Oncology

## 2017-03-02 VITALS — BP 106/64 | HR 56 | Temp 98.2°F | Resp 16 | Wt 167.0 lb

## 2017-03-02 DIAGNOSIS — D591 Other autoimmune hemolytic anemias: Secondary | ICD-10-CM

## 2017-03-02 DIAGNOSIS — B20 Human immunodeficiency virus [HIV] disease: Secondary | ICD-10-CM

## 2017-03-02 DIAGNOSIS — D7589 Other specified diseases of blood and blood-forming organs: Secondary | ICD-10-CM

## 2017-03-02 DIAGNOSIS — D5919 Other autoimmune hemolytic anemia: Secondary | ICD-10-CM

## 2017-03-02 DIAGNOSIS — E876 Hypokalemia: Secondary | ICD-10-CM

## 2017-03-02 LAB — COMPREHENSIVE METABOLIC PANEL
ALBUMIN: 4.3 g/dL (ref 3.5–5.0)
ALK PHOS: 151 U/L — AB (ref 38–126)
ALT: 21 U/L (ref 14–54)
ANION GAP: 8 (ref 5–15)
AST: 24 U/L (ref 15–41)
BILIRUBIN TOTAL: 0.7 mg/dL (ref 0.3–1.2)
BUN: 14 mg/dL (ref 6–20)
CALCIUM: 9.3 mg/dL (ref 8.9–10.3)
CO2: 28 mmol/L (ref 22–32)
CREATININE: 1.17 mg/dL — AB (ref 0.44–1.00)
Chloride: 105 mmol/L (ref 101–111)
GFR calc Af Amer: 58 mL/min — ABNORMAL LOW (ref >60)
GFR calc non Af Amer: 50 mL/min — ABNORMAL LOW (ref >60)
GLUCOSE: 87 mg/dL (ref 65–99)
Potassium: 3.9 mmol/L (ref 3.5–5.1)
SODIUM: 141 mmol/L (ref 135–145)
TOTAL PROTEIN: 6.6 g/dL (ref 6.5–8.1)

## 2017-03-02 LAB — CBC WITH DIFFERENTIAL/PLATELET
BASOS PCT: 0 %
Basophils Absolute: 0 10*3/uL (ref 0.0–0.1)
EOS ABS: 0 10*3/uL (ref 0.0–0.7)
Eosinophils Relative: 0 %
HEMATOCRIT: 39.3 % (ref 36.0–46.0)
HEMOGLOBIN: 13.8 g/dL (ref 12.0–15.0)
Lymphocytes Relative: 21 %
Lymphs Abs: 0.7 10*3/uL (ref 0.7–4.0)
MCH: 34.8 pg — ABNORMAL HIGH (ref 26.0–34.0)
MCHC: 35.1 g/dL (ref 30.0–36.0)
MCV: 99 fL (ref 78.0–100.0)
MONOS PCT: 13 %
Monocytes Absolute: 0.4 10*3/uL (ref 0.1–1.0)
Neutro Abs: 2.2 10*3/uL (ref 1.7–7.7)
Neutrophils Relative %: 66 %
Platelets: 151 10*3/uL (ref 150–400)
RBC: 3.97 MIL/uL (ref 3.87–5.11)
RDW: 11.9 % (ref 11.5–15.5)
WBC: 3.3 10*3/uL — AB (ref 4.0–10.5)

## 2017-03-02 LAB — LACTATE DEHYDROGENASE: LDH: 170 U/L (ref 98–192)

## 2017-03-02 NOTE — Patient Instructions (Addendum)
[facility] at [facility] Discharge Instructions  RECOMMENDATIONS MADE BY THE CONSULTANT AND ANY TEST RESULTS WILL BE SENT TO YOUR REFERRING PHYSICIAN.  You were seen today by Dr. Janak Choksi   Thank you for choosing [facility] at [facility] to provide your oncology and hematology care.  To afford each patient quality time with our provider, please arrive at least 15 minutes before your scheduled appointment time.    If you have a lab appointment with the Cancer Center please come in thru the  Main Entrance and check in at the main information desk  You need to re-schedule your appointment should you arrive 10 or more minutes late.  We strive to give you quality time with our providers, and arriving late affects you and other patients whose appointments are after yours.  Also, if you no show three or more times for appointments you may be dismissed from the clinic at the providers discretion.     Again, thank you for choosing [facility].  Our hope is that these requests will decrease the amount of time that you wait before being seen by our physicians.       _____________________________________________________________  Should you have questions after your visit to [facility], please contact our office at [phone] between the hours of 8:30 a.m. and 4:30 p.m.  Voicemails left after 4:30 p.m. will not be returned until the following business day.  For prescription refill requests, have your pharmacy contact our office.       Resources For Cancer Patients and their Caregivers ? American Cancer Society: Can assist with transportation, wigs, general needs, runs Look Good Feel Better.        1-888-227-6333 ? Cancer Care: Provides financial assistance, online support groups, medication/co-pay assistance.  1-800-813-HOPE (4673) ? [facility] Assists [place] cancer patients and their  families through emotional , educational and financial support.  336-427-4357 ? [place] DSS Where to apply for food stamps, Medicaid and utility assistance. 336-342-1394 ? [facility]: Transportation to medical appointments. 336-347-2287 ? Social Security Administration: May apply for disability if have a Stage IV cancer. 336-342-7796 1-800-772-1213 ? [place] Aging, Disability and Transit Services: Assists with nutrition, care and transit needs. 336-349-2343  Cancer Center Support Programs: @10RELATIVEDAYS@ > Cancer Support Group  2nd Tuesday of the month 1pm-2pm, Journey Room  > Creative Journey  3rd Tuesday of the month 1130am-1pm, Journey Room  > Look Good Feel Better  1st Wednesday of the month 10am-12 noon, Journey Room (Call American Cancer Society to register 1-800-395-5775)    

## 2017-03-02 NOTE — Progress Notes (Signed)
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 female is here for follow-up of a history of hemolytic anemia. She had relapsed disease and was placed on single agent rituxan and prednisone. Her hemolytic anemia is now controlled again.   She is doing well today. She has had vertigo since February. She has seen a neurologist, but they aren't sure what is causing it. She takes Valium when needed to help with the vertigo. Denies chest pain, SOB, fatigue, abdominal pain, loss of appetite, or any other concerns.  Patient remains asymptomatic.  In followed by infectious diseases specialist for HIV disease.  MEDICAL HISTORY:  Past Medical History:  Diagnosis Date  . Anemia   . Colon polyps    adenomatous  . CVA (cerebral vascular accident) ([place])   . Headache   . HIV positive ([place]) 1987  . Hypercholesterolemia   . Hyperlipidemia   . Internal hemorrhoids   . Shingles   . Toxoplasmosis     SURGICAL HISTORY: Past Surgical History:  Procedure Laterality Date  . APPENDECTOMY  2001  . KNEE ARTHROSCOPY Bilateral   . SHOULDER SURGERY Right   . TONSILLECTOMY      SOCIAL HISTORY: Social History   Social History  . Marital status: Married    Spouse name: [name]  . Number of children: 2  . Years of  education: 12   Occupational History  .  Unemployed   Social History Main Topics  . Smoking status: Never Smoker  . Smokeless tobacco: Never Used  . Alcohol use No  . Drug use: No  . Sexual activity: Not Currently     Comment: declined condoms   Other Topics Concern  . Not on file   Social History Narrative   Lives with spouse and son   Caffeine use: 12oz daily    FAMILY HISTORY: Family History  Problem Relation Age of Onset  . Heart attack Father   . High Cholesterol Father   . Breast cancer Mother        mets to lung and brain  . Cancer Mother   . Prostate cancer Brother   . Stroke Neg Hx   . Migraines Neg Hx   . Neuropathy Neg Hx    indicated that her mother is deceased. She indicated that her father is deceased. She indicated that the status of her brother is unknown. She indicated that the status of her neg hx is unknown.    Mother died at 72 from breast cancer Father died at 36 from MI 1 sister, 3 brothers all healthy  ALLERGIES:  is allergic to penicillins; stavudine; and sulfamethoxazole-trimethoprim.  MEDICATIONS:  Current Outpatient Prescriptions  Medication Sig Dispense Refill  . abacavir-dolutegravir-lamiVUDine (TRIUMEQ) 600-50-300 MG tablet Take 1 tablet by mouth daily. 90 tablet 3  . acetaminophen (TYLENOL) 500 MG tablet Take 500 mg by mouth every 6 (six) hours as needed.    Marland Kitchen atorvastatin (  LIPITOR) 10 MG tablet Take 10 mg by mouth daily.    . beta carotene w/minerals (OCUVITE) tablet Take 2 tablets by mouth daily.    . calcium carbonate (OS-CAL) 600 MG TABS Take 1,200 mg by mouth daily.     . Cholecalciferol (VITAMIN D-3) 1000 units CAPS Take 1,000 Units by mouth daily.    . clopidogrel (PLAVIX) 75 MG tablet TAKE 1 TABLET BY MOUTH DAILY 90 tablet 1  . diazepam (VALIUM) 2 MG tablet Take 1 tablet (2 mg total) by mouth every 6 (six) hours as needed for vertigo. May try 1/2 pill as well. 20 tablet 4  . KLOR-CON M20 20 MEQ tablet TAKE 1 TABLET BY MOUTH  TWICE A DAY 60 tablet 0  . Omega-3 Fatty Acids (FISH OIL) 1000 MG CAPS Take 1 capsule by mouth daily.     . ondansetron (ZOFRAN) 8 MG tablet Take 1 tablet (8 mg total) by mouth every 8 (eight) hours as needed for nausea or vomiting. 30 tablet 2  . prochlorperazine (COMPAZINE) 10 MG tablet Take 1 tablet (10 mg total) by mouth every 6 (six) hours as needed for nausea or vomiting. 30 tablet 2  . zolpidem (AMBIEN) 10 MG tablet Take 1/2-1 tablet at bedtime as needed for trouble sleeping 30 tablet 3   No current facility-administered medications for this visit.    Review of Systems  Constitutional: Negative.  Negative for malaise/fatigue.       No loss of appetite  HENT: Negative.   Eyes: Negative.   Respiratory: Negative.  Negative for shortness of breath.   Cardiovascular: Negative.  Negative for chest pain.  Gastrointestinal: Negative.  Negative for abdominal pain.  Genitourinary: Negative.   Musculoskeletal: Negative.   Skin: Negative.   Neurological: Positive for dizziness (vertigo).  Endo/Heme/Allergies: Negative.   Psychiatric/Behavioral: Negative.   All other systems reviewed and are negative.  14 point review of systems was performed and is negative except as detailed under history of present illness and above  PHYSICAL EXAMINATION: ECOG PERFORMANCE STATUS: 1 - Symptomatic but completely ambulatory  There were no vitals filed for this visit. There were no vitals filed for this visit.  Physical Exam  Constitutional: She is oriented to person, place, and time and well-developed, well-nourished, and in no distress.  Pt was able to get on exam table without assistance.   HENT:  Head: Normocephalic and atraumatic.  Eyes: Pupils are equal, round, and reactive to light. EOM are normal. No scleral icterus.  Neck: Normal range of motion. Neck supple.  Cardiovascular: Normal rate, regular rhythm and normal heart sounds.   Pulmonary/Chest: Effort normal and breath sounds normal.    Abdominal: Soft. Bowel sounds are normal. She exhibits no distension. There is no tenderness.  Musculoskeletal: Normal range of motion. She exhibits no edema.  Lymphadenopathy:    She has no cervical adenopathy.  Neurological: She is alert and oriented to person, place, and time. No cranial nerve deficit. Gait normal.  Skin: Skin is warm and dry.  Psychiatric: Mood, memory, affect and judgment normal.  Nursing note and vitals reviewed.  LABORATORY DATA:  I have reviewed the data as listed CBC Latest Ref Rng & Units 03/02/2017 10/31/2016 10/06/2016  WBC 4.0 - 10.5 K/uL 3.3(L) 2.7(L) 2.8(L)  Hemoglobin 12.0 - 15.0 g/dL 13.8 13.2 12.7  Hematocrit 36.0 - 46.0 % 39.3 38.2 36.3  Platelets 150 - 400 K/uL 151 143(L) 143(L)   CMP Latest Ref Rng & Units 03/02/2017 10/31/2016 10/06/2016  Glucose 65 -  99 mg/dL 87 85 85  BUN 6 - 20 mg/dL 14 16 12   Creatinine 0.44 - 1.00 mg/dL 1.17(H) 1.18(H) 1.26(H)  Sodium 135 - 145 mmol/L 141 139 141  Potassium 3.5 - 5.1 mmol/L 3.9 3.5 3.4(L)  Chloride 101 - 111 mmol/L 105 105 108  CO2 22 - 32 mmol/L 28 27 26   Calcium 8.9 - 10.3 mg/dL 9.3 9.3 9.0  Total Protein 6.5 - 8.1 g/dL 6.6 6.5 6.2(L)  Total Bilirubin 0.3 - 1.2 mg/dL 0.7 0.6 0.5  Alkaline Phos 38 - 126 U/L 151(H) 128(H) 114  AST 15 - 41 U/L 24 31 33  ALT 14 - 54 U/L 21 28 30    PATHOLOGY:   RADIOLOGY: I have personally reviewed the radiological images as listed and agreed with the findings in the report.  MRI Breast b/l 06/21/2016 IMPRESSION: No abnormal enhancement in either breast.  DEXA Scan 04/13/2015 BMD as determined from Femur Neck Left is 0.765 g/cm2 with a T-Score of -2.0. This patient is considered osteopenic according to [facility]) criteria. (L-4 was excluded due to advanced degenerative changes.)  World Health Organization [facility]) criteria for post-menopausal, Caucasian Women: Normal:       T-score at or above -1 SD Osteopenia:   T-score between -1 and -2.5  SD Osteoporosis: T-score at or below -2.5 SD  ASSESSMENT & PLAN:  Coombs positive hemolytic anemia Steroid and rituxan responsive  I reviewed the labs with the patient in detail, she is not anemic and there's no evidence of hemolysis at this time.  Barrie Dunker has been stable. Return appointment in 6 months for reevaluation No orders of the defined types were placed in this encounter.   All questions were answered. The patient knows to call the clinic with any problems, questions or concerns  This document serves as a record of services personally performed by Twana First, MD. It was created on her behalf by Martinique Casey, a trained medical scribe. The creation of this record is based on the scribe's personal observations and the provider's statements to them. This document has been checked and approved by the attending provider.  I have reviewed the above documentation for accuracy and completeness, and I agree with the above.  This note was electronically signed.  Forest Gleason, MD  03/02/2017 11:38 AM

## 2017-03-03 DIAGNOSIS — J029 Acute pharyngitis, unspecified: Secondary | ICD-10-CM | POA: Diagnosis not present

## 2017-03-03 LAB — HAPTOGLOBIN: Haptoglobin: 149 mg/dL (ref 34–200)

## 2017-03-15 ENCOUNTER — Encounter (HOSPITAL_BASED_OUTPATIENT_CLINIC_OR_DEPARTMENT_OTHER): Payer: Self-pay | Admitting: *Deleted

## 2017-03-15 ENCOUNTER — Telehealth: Payer: Self-pay | Admitting: *Deleted

## 2017-03-15 NOTE — Telephone Encounter (Signed)
Patient is seen on an annual basis, is due for an appointment in September. Dr Megan Salon has been refilling her plavix after patient's stroke in 2012 - she states that she didn't know where else to go for these refills.  She is scheduled for a surgery on Tuesday, needs guidance on when/if to stop the plavix.   Patient does have a new PCP, Dr Sharilyn Sites. She will reach out to him today for advice. Landis Gandy, RN

## 2017-03-15 NOTE — Telephone Encounter (Signed)
I agree. Thanks.

## 2017-03-16 DIAGNOSIS — E663 Overweight: Secondary | ICD-10-CM | POA: Diagnosis not present

## 2017-03-16 DIAGNOSIS — M25522 Pain in left elbow: Secondary | ICD-10-CM | POA: Diagnosis not present

## 2017-03-16 DIAGNOSIS — Z6829 Body mass index (BMI) 29.0-29.9, adult: Secondary | ICD-10-CM | POA: Diagnosis not present

## 2017-03-16 DIAGNOSIS — Z8673 Personal history of transient ischemic attack (TIA), and cerebral infarction without residual deficits: Secondary | ICD-10-CM | POA: Diagnosis not present

## 2017-03-16 DIAGNOSIS — Z1389 Encounter for screening for other disorder: Secondary | ICD-10-CM | POA: Diagnosis not present

## 2017-03-20 ENCOUNTER — Encounter (HOSPITAL_BASED_OUTPATIENT_CLINIC_OR_DEPARTMENT_OTHER): Payer: Self-pay | Admitting: Certified Registered"

## 2017-03-20 ENCOUNTER — Ambulatory Visit (HOSPITAL_BASED_OUTPATIENT_CLINIC_OR_DEPARTMENT_OTHER): Payer: Federal, State, Local not specified - PPO | Admitting: Anesthesiology

## 2017-03-20 ENCOUNTER — Encounter (HOSPITAL_BASED_OUTPATIENT_CLINIC_OR_DEPARTMENT_OTHER)
Admission: RE | Disposition: A | Discharge status: Home / Self Care | Payer: Self-pay | Source: Ambulatory Visit | Attending: Orthopedic Surgery

## 2017-03-20 ENCOUNTER — Ambulatory Visit (HOSPITAL_BASED_OUTPATIENT_CLINIC_OR_DEPARTMENT_OTHER)
Admission: RE | Admit: 2017-03-20 | Discharge: 2017-03-20 | Disposition: A | Discharge status: Home / Self Care | Payer: Federal, State, Local not specified - PPO | Source: Ambulatory Visit | Attending: Orthopedic Surgery | Admitting: Orthopedic Surgery

## 2017-03-20 DIAGNOSIS — Z21 Asymptomatic human immunodeficiency virus [HIV] infection status: Secondary | ICD-10-CM | POA: Insufficient documentation

## 2017-03-20 DIAGNOSIS — E7800 Pure hypercholesterolemia, unspecified: Secondary | ICD-10-CM | POA: Diagnosis not present

## 2017-03-20 DIAGNOSIS — G5622 Lesion of ulnar nerve, left upper limb: Secondary | ICD-10-CM | POA: Insufficient documentation

## 2017-03-20 DIAGNOSIS — Z8673 Personal history of transient ischemic attack (TIA), and cerebral infarction without residual deficits: Secondary | ICD-10-CM | POA: Insufficient documentation

## 2017-03-20 DIAGNOSIS — G8918 Other acute postprocedural pain: Secondary | ICD-10-CM | POA: Diagnosis not present

## 2017-03-20 DIAGNOSIS — E785 Hyperlipidemia, unspecified: Secondary | ICD-10-CM | POA: Insufficient documentation

## 2017-03-20 HISTORY — PX: ULNAR NERVE TRANSPOSITION: SHX2595

## 2017-03-20 SURGERY — ULNAR NERVE DECOMPRESSION/TRANSPOSITION
Anesthesia: General | Site: Elbow | Laterality: Left

## 2017-03-20 MED ORDER — VANCOMYCIN HCL IN DEXTROSE 1-5 GM/200ML-% IV SOLN
INTRAVENOUS | Status: AC
  Filled 2017-03-20: qty 200

## 2017-03-20 MED ORDER — LACTATED RINGERS IV SOLN
INTRAVENOUS | Status: DC
  Administered 2017-03-20: 11:00:00 via INTRAVENOUS

## 2017-03-20 MED ORDER — VANCOMYCIN HCL IN DEXTROSE 1-5 GM/200ML-% IV SOLN
1000.0000 mg | INTRAVENOUS | Status: AC
  Administered 2017-03-20: 1000 mg via INTRAVENOUS

## 2017-03-20 MED ORDER — CHLORHEXIDINE GLUCONATE 4 % EX LIQD: 60.0000 mL | Freq: Once | CUTANEOUS | Status: DC

## 2017-03-20 MED ORDER — PROPOFOL 10 MG/ML IV BOLUS
INTRAVENOUS | Status: DC | PRN
  Administered 2017-03-20: 150 mg via INTRAVENOUS

## 2017-03-20 MED ORDER — EPHEDRINE SULFATE 50 MG/ML IJ SOLN
INTRAMUSCULAR | Status: DC | PRN
  Administered 2017-03-20: 10 mg via INTRAVENOUS

## 2017-03-20 MED ORDER — LIDOCAINE HCL (CARDIAC) 20 MG/ML IV SOLN
INTRAVENOUS | Status: DC | PRN
  Administered 2017-03-20: 30 mg via INTRAVENOUS

## 2017-03-20 MED ORDER — SCOPOLAMINE 1 MG/3DAYS TD PT72: 1.0000 | MEDICATED_PATCH | Freq: Once | TRANSDERMAL | Status: DC | PRN

## 2017-03-20 MED ORDER — BUPIVACAINE-EPINEPHRINE (PF) 0.5% -1:200000 IJ SOLN
INTRAMUSCULAR | Status: DC | PRN
  Administered 2017-03-20: 20 mL via PERINEURAL

## 2017-03-20 MED ORDER — FENTANYL CITRATE (PF) 100 MCG/2ML IJ SOLN
INTRAMUSCULAR | Status: AC
  Filled 2017-03-20: qty 2

## 2017-03-20 MED ORDER — OXYCODONE-ACETAMINOPHEN 5-325 MG PO TABS: 1.0000 | ORAL_TABLET | ORAL | 0 refills | Status: DC | PRN

## 2017-03-20 MED ORDER — DEXAMETHASONE SODIUM PHOSPHATE 10 MG/ML IJ SOLN
INTRAMUSCULAR | Status: DC | PRN
  Administered 2017-03-20: 10 mg via INTRAVENOUS

## 2017-03-20 MED ORDER — MIDAZOLAM HCL 2 MG/2ML IJ SOLN
1.0000 mg | INTRAMUSCULAR | Status: DC | PRN
  Administered 2017-03-20: 1 mg via INTRAVENOUS

## 2017-03-20 MED ORDER — FENTANYL CITRATE (PF) 100 MCG/2ML IJ SOLN: 25.0000 ug | INTRAMUSCULAR | Status: DC | PRN

## 2017-03-20 MED ORDER — MIDAZOLAM HCL 2 MG/2ML IJ SOLN
INTRAMUSCULAR | Status: AC
  Filled 2017-03-20: qty 2

## 2017-03-20 MED ORDER — FENTANYL CITRATE (PF) 100 MCG/2ML IJ SOLN
50.0000 ug | INTRAMUSCULAR | Status: DC | PRN
  Administered 2017-03-20: 50 ug via INTRAVENOUS
  Administered 2017-03-20: 25 ug via INTRAVENOUS

## 2017-03-20 MED ORDER — PROMETHAZINE HCL 25 MG/ML IJ SOLN: 6.2500 mg | INTRAMUSCULAR | Status: DC | PRN

## 2017-03-20 SURGICAL SUPPLY — 48 items
BLADE MINI RND TIP GREEN BEAV (BLADE) IMPLANT
BLADE SURG 15 STRL LF DISP TIS (BLADE) ×1 IMPLANT
BLADE SURG 15 STRL SS (BLADE) ×1
BNDG COHESIVE 3X5 TAN STRL LF (GAUZE/BANDAGES/DRESSINGS) ×2 IMPLANT
BNDG ESMARK 4X9 LF (GAUZE/BANDAGES/DRESSINGS) ×2 IMPLANT
BNDG GAUZE ELAST 4 BULKY (GAUZE/BANDAGES/DRESSINGS) ×2 IMPLANT
CHLORAPREP W/TINT 26ML (MISCELLANEOUS) ×2 IMPLANT
CORD BIPOLAR FORCEPS 12FT (ELECTRODE) ×2 IMPLANT
COVER BACK TABLE 60X90IN (DRAPES) ×2 IMPLANT
COVER MAYO STAND STRL (DRAPES) ×2 IMPLANT
CUFF TOURN SGL LL 18 NRW (TOURNIQUET CUFF) ×2 IMPLANT
DECANTER SPIKE VIAL GLASS SM (MISCELLANEOUS) IMPLANT
DRAPE EXTREMITY T 121X128X90 (DRAPE) ×2 IMPLANT
DRAPE SURG 17X23 STRL (DRAPES) ×2 IMPLANT
DRSG PAD ABDOMINAL 8X10 ST (GAUZE/BANDAGES/DRESSINGS) ×2 IMPLANT
GAUZE SPONGE 4X4 12PLY STRL (GAUZE/BANDAGES/DRESSINGS) ×2 IMPLANT
GAUZE SPONGE 4X4 16PLY XRAY LF (GAUZE/BANDAGES/DRESSINGS) IMPLANT
GAUZE XEROFORM 1X8 LF (GAUZE/BANDAGES/DRESSINGS) ×2 IMPLANT
GLOVE BIOGEL PI IND STRL 7.0 (GLOVE) ×2 IMPLANT
GLOVE BIOGEL PI IND STRL 8.5 (GLOVE) ×1 IMPLANT
GLOVE BIOGEL PI INDICATOR 7.0 (GLOVE) ×2
GLOVE BIOGEL PI INDICATOR 8.5 (GLOVE) ×1
GLOVE ECLIPSE 6.5 STRL STRAW (GLOVE) ×2 IMPLANT
GLOVE SURG ORTHO 8.0 STRL STRW (GLOVE) ×2 IMPLANT
GOWN STRL REUS W/ TWL LRG LVL3 (GOWN DISPOSABLE) ×1 IMPLANT
GOWN STRL REUS W/TWL LRG LVL3 (GOWN DISPOSABLE) ×1
GOWN STRL REUS W/TWL XL LVL3 (GOWN DISPOSABLE) ×2 IMPLANT
LOOP VESSEL MAXI BLUE (MISCELLANEOUS) IMPLANT
NEEDLE PRECISIONGLIDE 27X1.5 (NEEDLE) IMPLANT
NS IRRIG 1000ML POUR BTL (IV SOLUTION) ×2 IMPLANT
PACK BASIN DAY SURGERY FS (CUSTOM PROCEDURE TRAY) ×2 IMPLANT
PAD CAST 3X4 CTTN HI CHSV (CAST SUPPLIES) IMPLANT
PAD CAST 4YDX4 CTTN HI CHSV (CAST SUPPLIES) ×1 IMPLANT
PADDING CAST COTTON 3X4 STRL (CAST SUPPLIES)
PADDING CAST COTTON 4X4 STRL (CAST SUPPLIES) ×1
SLEEVE SCD COMPRESS KNEE MED (MISCELLANEOUS) ×2 IMPLANT
SLING ARM FOAM STRAP LRG (SOFTGOODS) ×2 IMPLANT
SPLINT PLASTER CAST XFAST 3X15 (CAST SUPPLIES) IMPLANT
SPLINT PLASTER XTRA FASTSET 3X (CAST SUPPLIES)
STOCKINETTE 4X48 STRL (DRAPES) ×2 IMPLANT
SUT ETHILON 4 0 PS 2 18 (SUTURE) ×2 IMPLANT
SUT VIC AB 2-0 SH 27 (SUTURE) ×1
SUT VIC AB 2-0 SH 27XBRD (SUTURE) ×1 IMPLANT
SUT VICRYL 4-0 PS2 18IN ABS (SUTURE) IMPLANT
SYR BULB 3OZ (MISCELLANEOUS) ×2 IMPLANT
SYR CONTROL 10ML LL (SYRINGE) IMPLANT
TOWEL OR 17X24 6PK STRL BLUE (TOWEL DISPOSABLE) ×4 IMPLANT
UNDERPAD 30X30 (UNDERPADS AND DIAPERS) IMPLANT

## 2017-03-20 NOTE — Discharge Instructions (Addendum)
Regional Anesthesia Blocks ° °1. Numbness or the inability to move the "blocked" extremity may last from 3-48 hours after placement. The length of time depends on the medication injected and your individual response to the medication. If the numbness is not going away after 48 hours, call your surgeon. ° °2. The extremity that is blocked will need to be protected until the numbness is gone and the  Strength has returned. Because you cannot feel it, you will need to take extra care to avoid injury. Because it may be weak, you may have difficulty moving it or using it. You may not know what position it is in without looking at it while the block is in effect. ° °3. For blocks in the legs and feet, returning to weight bearing and walking needs to be done carefully. You will need to wait until the numbness is entirely gone and the strength has returned. You should be able to move your leg and foot normally before you try and bear weight or walk. You will need someone to be with you when you first try to ensure you do not fall and possibly risk injury. ° °4. Bruising and tenderness at the needle site are common side effects and will resolve in a few days. ° °5. Persistent numbness or new problems with movement should be communicated to the surgeon or the [facility] ([phone])/ [facility] ([phone]). ° ° ° °Post Anesthesia Home Care Instructions ° °Activity: °Get plenty of rest for the remainder of the day. A responsible individual must stay with you for 24 hours following the procedure.  °For the next 24 hours, DO NOT: °-Drive a car °-Operate machinery °-Drink alcoholic beverages °-Take any medication unless instructed by your physician °-Make any legal decisions or sign important papers. ° °Meals: °Start with liquid foods such as gelatin or soup. Progress to regular foods as tolerated. Avoid greasy, spicy, heavy foods. If nausea and/or vomiting occur, drink only clear liquids until the  nausea and/or vomiting subsides. Call your physician if vomiting continues. ° °Special Instructions/Symptoms: °Your throat may feel dry or sore from the anesthesia or the breathing tube placed in your throat during surgery. If this causes discomfort, gargle with warm salt water. The discomfort should disappear within 24 hours. ° °If you had a scopolamine patch placed behind your ear for the management of post- operative nausea and/or vomiting: ° °1. The medication in the patch is effective for 72 hours, after which it should be removed.  Wrap patch in a tissue and discard in the trash. Wash hands thoroughly with soap and water. °2. You may remove the patch earlier than 72 hours if you experience unpleasant side effects which may include dry mouth, dizziness or visual disturbances. °3. Avoid touching the patch. Wash your hands with soap and water after contact with the patch. °  °Hand Center Instructions °Hand Surgery ° °Wound Care: °Keep your hand elevated above the level of your heart.  Do not allow it to dangle by your side.  Keep the dressing dry and do not remove it unless your doctor advises you to do so.  He will usually change it at the time of your post-op visit.  Moving your fingers is advised to stimulate circulation but will depend on the site of your surgery.  If you have a splint applied, your doctor will advise you regarding movement. ° °Activity: °Do not drive or operate machinery today.  Rest today and then you may   then you may return to your normal activity and work as indicated by your physician.  Diet:  Drink liquids today or eat a light diet.  You may resume a regular diet tomorrow.    General expectations: Pain for two to three days. Fingers may become slightly swollen.  Call your doctor if any of the following occur: Severe pain not relieved by pain medication. Elevated temperature. Dressing soaked with blood. Inability to move fingers. White or bluish color to fingers.  

## 2017-03-20 NOTE — Anesthesia Postprocedure Evaluation (Signed)
Anesthesia Post Note  Patient: Theresa Morrison  Procedure(s) Performed: Procedure(s) (LRB): LEFT ELBOW ULNAR NERVE DECOMPRESSION (Left)     Patient location during evaluation: PACU Anesthesia Type: General Level of consciousness: awake and alert Pain management: pain level controlled Vital Signs Assessment: post-procedure vital signs reviewed and stable Respiratory status: spontaneous breathing, nonlabored ventilation and respiratory function stable Cardiovascular status: blood pressure returned to baseline and stable Postop Assessment: no signs of nausea or vomiting Anesthetic complications: no    Last Vitals:  Vitals:   03/20/17 1230 03/20/17 1251  BP: 99/61 104/67  Pulse: 75 62  Resp: 12 18  Temp:  36.6 C  SpO2: 93% 99%    Last Pain:  Vitals:   03/20/17 1251  TempSrc: Oral  PainSc: 0-No pain                 Audry Pili

## 2017-03-20 NOTE — H&P (Signed)
Theresa Morrison is an 61 y.o. female.   Chief Complaint: numbness left hand HPI: Theresa Morrison is a 61 year old right-hand-dominant female referred by Dr. Armandina Gemma for consultation regarding numbness in her small finger left hand. This been going approximately 2 months. She states it is relatively constant. She is not complaining of any discomfort on her right side. She has no history of injury to the hand elbow or neck. She complains of a pinching aching type pain with a VAS score 7/10. She states nothing seems to make it better or worse. She was given meloxicam which has not given her any significant relief. She has no history diabetes thyroid problems arthritis gout. Family history is negative for each of these also. Does have a history of a stroke.She was sent for nerve conduction with Dr. Thereasa Morrison. 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 Her nerve is enlarged on ultrasound also. She is HIV positive          Past Medical History:  Diagnosis Date  . Anemia   . Colon polyps    adenomatous  . CVA (cerebral vascular accident) ([place]) 2000  . Headache   . HIV positive ([place]) 1987  . Hypercholesterolemia   . Hyperlipidemia   . Internal hemorrhoids   . Shingles   . Toxoplasmosis     Past Surgical History:  Procedure Laterality Date  . APPENDECTOMY  2001  . KNEE ARTHROSCOPY Bilateral   . SHOULDER SURGERY Right   . TONSILLECTOMY      Family History  Problem Relation Age of Onset  . Heart attack Father   . High Cholesterol Father   . Breast cancer Mother        mets to lung and brain  . Cancer Mother   . Prostate cancer Brother   . Stroke Neg Hx   . Migraines Neg Hx   . Neuropathy Neg Hx    Social History:  reports that she has never smoked. She has never used smokeless tobacco. She reports that she does not drink alcohol or use drugs.  Allergies:  Allergies  Allergen Reactions  .  Penicillins     REACTION: facial swelling  . Stavudine     REACTION: peripheral neuropathy  . Sulfamethoxazole-Trimethoprim     REACTION: hives    No prescriptions prior to admission.    No results found for this or any previous visit (from the past 48 hour(s)).  No results found.   Pertinent items are noted in HPI.  Height 5\' 4"  (1.626 m), weight 75.8 kg (167 lb).  General appearance: alert, cooperative and appears stated age Head: Normocephalic, without obvious abnormality Neck: no JVD Resp: clear to auscultation bilaterally Cardio: regular rate and rhythm, S1, S2 normal, no murmur, click, rub or gallop GI: soft, non-tender; bowel sounds normal; no masses,  no organomegaly Extremities: numbness left hand Pulses: 2+ and symmetric Skin: Skin color, texture, turgor normal. No rashes or lesions Neurologic: Grossly normal Incision/Wound: na  Assessment/Plan  Assessment:  1. Numbness    Plan: We have discussed possible surgical decompression transposition to the ulnar nerve. Prepare postoperative course are discussed along with risk complications. She is aware that there is no guarantee to the surgery the possibility of infection recurrence injury to arteries nerves tendons complete relief symptoms and dystrophy. 4 decompression possible transposition ulnar nerve as an outpatient under regional anesthesia.      Theresa Morrison R 03/20/2017, 9:22 AM

## 2017-03-20 NOTE — Anesthesia Preprocedure Evaluation (Addendum)
Anesthesia Evaluation  Patient identified by MRN, date of birth, ID band Patient awake    Reviewed: Allergy & Precautions, NPO status , Patient's Chart, lab work & pertinent test results  Airway Mallampati: II  TM Distance: >3 FB Neck ROM: Full    Dental no notable dental hx. (+) Dental Advisory Given   Pulmonary neg pulmonary ROS,    Pulmonary exam normal breath sounds clear to auscultation       Cardiovascular negative cardio ROS Normal cardiovascular exam Rhythm:Regular Rate:Normal     Neuro/Psych  Headaches, Vertigo, diplopia CVA negative psych ROS   GI/Hepatic negative GI ROS, Neg liver ROS,   Endo/Other  negative endocrine ROS  Renal/GU negative Renal ROS  negative genitourinary   Musculoskeletal negative musculoskeletal ROS (+)   Abdominal   Peds  Hematology  (+) anemia ,   Anesthesia Other Findings HIV+, HSV+, hx toxoplasmosis, lipodystrophy  Reproductive/Obstetrics                             Anesthesia Physical Anesthesia Plan  ASA: III  Anesthesia Plan: General   Post-op Pain Management:  Regional for Post-op pain   Induction: Intravenous  PONV Risk Score and Plan: 3 and Ondansetron, Dexamethasone, Midazolam, Treatment may vary due to age or medical condition and Scopolamine patch - Pre-op  Airway Management Planned: LMA  Additional Equipment:   Intra-op Plan:   Post-operative Plan: Extubation in OR  Informed Consent: I have reviewed the patients History and Physical, chart, labs and discussed the procedure including the risks, benefits and alternatives for the proposed anesthesia with the patient or authorized representative who has indicated his/her understanding and acceptance.   Dental advisory given  Plan Discussed with: CRNA  Anesthesia Plan Comments:         Anesthesia Quick Evaluation

## 2017-03-20 NOTE — Anesthesia Procedure Notes (Signed)
Procedure Name: LMA Insertion Date/Time: 03/20/2017 11:10 AM Performed by: Kyndra Condron D Pre-anesthesia Checklist: Patient identified, Emergency Drugs available, Suction available and Patient being monitored Patient Re-evaluated:Patient Re-evaluated prior to induction Oxygen Delivery Method: Circle system utilized Preoxygenation: Pre-oxygenation with 100% oxygen Induction Type: IV induction Ventilation: Mask ventilation without difficulty LMA: LMA inserted LMA Size: 3.0 Number of attempts: 1 Airway Equipment and Method: Bite block Placement Confirmation: positive ETCO2 Tube secured with: Tape Dental Injury: Teeth and Oropharynx as per pre-operative assessment

## 2017-03-20 NOTE — Brief Op Note (Signed)
03/20/2017  11:45 AM  PATIENT:  Theresa Morrison  61 y.o. female  PRE-OPERATIVE DIAGNOSIS:  cubital tunnel left  POST-OPERATIVE DIAGNOSIS:  cubital tunnel left  PROCEDURE:  Procedure(s): LEFT ELBOW ULNAR NERVE DECOMPRESSION (Left)  SURGEON:  Surgeon(s) and Role:    [name], MD - Primary  PHYSICIAN ASSISTANT:   ASSISTANTS: none   ANESTHESIA:   regional and general  EBL:  No intake/output data recorded.  BLOOD ADMINISTERED:none  DRAINS: none   LOCAL MEDICATIONS USED:  NONE  SPECIMEN:  No Specimen  DISPOSITION OF SPECIMEN:  N/A  COUNTS:  YES  TOURNIQUET:   Total Tourniquet Time Documented: Upper Arm (Left) - 19 minutes Total: Upper Arm (Left) - 19 minutes   DICTATION: .Other Dictation: Dictation Number [id number]  PLAN OF CARE: Discharge to home after PACU  PATIENT DISPOSITION:  PACU - hemodynamically stable.

## 2017-03-20 NOTE — Anesthesia Procedure Notes (Signed)
Anesthesia Regional Block: Supraclavicular block   Pre-Anesthetic Checklist: ,, timeout performed, Correct Patient, Correct Site, Correct Laterality, Correct Procedure, Correct Position, site marked, Risks and benefits discussed,  Surgical consent,  Pre-op evaluation,  At surgeon's request and post-op pain management  Laterality: Left  Prep: chloraprep       Needles:  Injection technique: Single-shot  Needle Type: Echogenic Needle     Needle Length: 9cm  Needle Gauge: 21     Additional Needles:   Procedures: ultrasound guided,,,,,,,,  Narrative:  Start time: 03/20/2017 10:49 AM End time: 03/20/2017 10:51 AM Injection made incrementally with aspirations every 5 mL.  Performed by: Personally  Anesthesiologist: Renold Don E  Additional Notes: No pain on injection. No increased resistance to injection. Injection made in 5cc increments. Good needle visualization. Patient tolerated the procedure well.

## 2017-03-20 NOTE — Progress Notes (Signed)
Assisted Dr. Maralyn Sago with left, ultrasound guided, supraclavicular block. Side rails up, monitors on throughout procedure. See vital signs in flow sheet. Tolerated Procedure well.

## 2017-03-20 NOTE — Transfer of Care (Signed)
Immediate Anesthesia Transfer of Care Note  Patient: Theresa Morrison  Procedure(s) Performed: Procedure(s): LEFT ELBOW ULNAR NERVE DECOMPRESSION (Left)  Patient Location: PACU  Anesthesia Type:GA combined with regional for post-op pain  Level of Consciousness: awake and patient cooperative  Airway & Oxygen Therapy: Patient Spontanous Breathing and Patient connected to face mask oxygen  Post-op Assessment: Report given to RN and Post -op Vital signs reviewed and stable  Post vital signs: Reviewed and stable  Last Vitals:  Vitals:   03/20/17 1050 03/20/17 1051  BP:  98/62  Pulse: (!) 58 (!) 59  Resp:    Temp:    SpO2: 100% 100%    Last Pain:  Vitals:   03/20/17 1035  TempSrc: Oral  PainSc: 0-No pain         Complications: No apparent anesthesia complications

## 2017-03-20 NOTE — Op Note (Signed)
Dictation Number [id number]

## 2017-03-21 ENCOUNTER — Encounter (HOSPITAL_BASED_OUTPATIENT_CLINIC_OR_DEPARTMENT_OTHER): Payer: Self-pay | Admitting: Orthopedic Surgery

## 2017-03-21 NOTE — Op Note (Signed)
Theresa Morrison, CARRETO           ACCOUNT NO.:  [id number]  MEDICAL RECORD NO.:  [id number]  LOCATION:                                 FACILITY:  PHYSICIAN:  [name], M.D.            DATE OF BIRTH:  DATE OF PROCEDURE:  03/20/2017 DATE OF DISCHARGE:                              OPERATIVE REPORT   PREOPERATIVE DIAGNOSIS:  Cubital tunnel syndrome, left elbow.  POSTOPERATIVE DIAGNOSIS:  Cubital tunnel syndrome, left elbow.  OPERATION:  Decompression of ulnar nerve, left elbow.  SURGEON:  [name], M.D.  ANESTHESIA:  Supraclavicular block with sedation.  PLACE OF SURGERY:  [facility].  ANESTHESIOLOGIST:  [place].  HISTORY:  The patient is a 61 year old female with a history of numbness and tingling to the ring and small fingers.  She has had nerve conductions done revealing a 6, 8 radiculopathy along with cubital tunnel syndrome, left arm.  She has elected to undergo surgical decompression, possible transposition to the ulnar nerve, left side. Pre, peri, and postoperative courses have been discussed along with risks and complications.  She is aware that there is no guarantee to the surgery, the possibility of infection; recurrence of injury to arteries, nerves, tendons; incomplete relief of symptoms and dystrophy.  In the preoperative area, the patient was seen, the extremity marked by both patient and surgeon.  Antibiotic given.  PROCEDURE IN DETAIL:  The patient was brought to the operating room, where a general anesthetic was carried out after a supraclavicular block was carried out in the preoperative area.  She was prepped using ChloraPrep in a supine position with left arm free.  A 3-minute dry time was allowed and time-out taken confirming the patient and procedure. The limb was exsanguinated with an Esmarch bandage.  Tourniquet placed high on the arm was inflated to 250 mmHg.  A short incision approximately 2.5 cm in length was made over the medial  epicondyle of left elbow, carried down through subcutaneous tissue.  Bleeders were electrocauterized with bipolar.  Dissection carried down to [employer].  This was released on its posterior aspect.  The flexor carpi ulnaris fascia was then split after dissecting the subcutaneous tissue and skin away from this and placement of a knee retractor.  The fascia was released proximally for approximately 6 cm distal to the epicondyle. The muscle belly was split.  A KMI carpal tunnel guide was placed and angled ENT scissors were then used to release the deep fascia for approximately 6-8 cm distally.  Attention was turned proximally.  The brachial fascia was dissected free from the overlying skin and subcutaneous tissue, and the knee retractor placed.  This allowed visualization of the brachial fascia.  The [facility] guide was placed between the nerve and the brachial fascia, and the brachial fascia was released proximally.  Unfortunately, a blood vessel was hit.  This required extending the wound proximally to find this and coagulate it with bipolar.  The nerve was found to be intact over its entire course, and with flexion of the elbow, no subluxation was noted.  The wound was copiously irrigated with saline.  The subcutaneous tissue was closed with interrupted 2-0 Vicryl  sutures and the skin with interrupted 4-0 nylon sutures.  Sterile compressive dressing was applied.  On deflation of the tourniquet, all fingers immediately pinked.  She was taken to the recovery room for observation in satisfactory condition. She will be discharged to home to return to the [place] in 1 week, on Percocet.          ______________________________ [name], M.D.     GK/MEDQ  D:  03/20/2017  T:  03/21/2017  Job:  [id number]

## 2017-03-27 DIAGNOSIS — H2512 Age-related nuclear cataract, left eye: Secondary | ICD-10-CM | POA: Diagnosis not present

## 2017-03-27 DIAGNOSIS — H25812 Combined forms of age-related cataract, left eye: Secondary | ICD-10-CM | POA: Diagnosis not present

## 2017-04-16 DIAGNOSIS — H2511 Age-related nuclear cataract, right eye: Secondary | ICD-10-CM | POA: Diagnosis not present

## 2017-04-16 DIAGNOSIS — H25011 Cortical age-related cataract, right eye: Secondary | ICD-10-CM | POA: Diagnosis not present

## 2017-04-16 DIAGNOSIS — H25041 Posterior subcapsular polar age-related cataract, right eye: Secondary | ICD-10-CM | POA: Diagnosis not present

## 2017-04-24 DIAGNOSIS — H25811 Combined forms of age-related cataract, right eye: Secondary | ICD-10-CM | POA: Diagnosis not present

## 2017-04-24 DIAGNOSIS — H2511 Age-related nuclear cataract, right eye: Secondary | ICD-10-CM | POA: Diagnosis not present

## 2017-05-09 ENCOUNTER — Other Ambulatory Visit: Payer: Federal, State, Local not specified - PPO

## 2017-05-09 DIAGNOSIS — B20 Human immunodeficiency virus [HIV] disease: Secondary | ICD-10-CM

## 2017-05-09 LAB — COMPLETE METABOLIC PANEL WITH GFR
AG Ratio: 2.7 (calc) — ABNORMAL HIGH (ref 1.0–2.5)
ALKALINE PHOSPHATASE (APISO): 145 U/L — AB (ref 33–130)
ALT: 16 U/L (ref 6–29)
AST: 19 U/L (ref 10–35)
Albumin: 4.3 g/dL (ref 3.6–5.1)
BUN/Creatinine Ratio: 10 (calc) (ref 6–22)
BUN: 12 mg/dL (ref 7–25)
CALCIUM: 9.3 mg/dL (ref 8.6–10.4)
CO2: 28 mmol/L (ref 20–32)
CREATININE: 1.16 mg/dL — AB (ref 0.50–1.05)
Chloride: 106 mmol/L (ref 98–110)
GFR, EST NON AFRICAN AMERICAN: 51 mL/min/{1.73_m2} — AB (ref >=60)
GFR, Est African American: 60 mL/min/{1.73_m2} (ref >=60)
GLUCOSE: 77 mg/dL (ref 65–99)
Globulin: 1.6 g/dL (calc) — ABNORMAL LOW (ref 1.9–3.7)
Potassium: 4.1 mmol/L (ref 3.5–5.3)
Sodium: 141 mmol/L (ref 135–146)
Total Bilirubin: 0.6 mg/dL (ref 0.2–1.2)
Total Protein: 5.9 g/dL — ABNORMAL LOW (ref 6.1–8.1)

## 2017-05-09 LAB — CBC WITH DIFFERENTIAL/PLATELET
BASOS ABS: 20 {cells}/uL (ref 0–200)
Basophils Relative: 0.7 %
EOS PCT: 0 %
Eosinophils Absolute: 0 cells/uL — ABNORMAL LOW (ref 15–500)
HEMATOCRIT: 37.2 % (ref 35.0–45.0)
HEMOGLOBIN: 13.1 g/dL (ref 11.7–15.5)
LYMPHS ABS: 605 {cells}/uL — AB (ref 850–3900)
MCH: 34.7 pg — ABNORMAL HIGH (ref 27.0–33.0)
MCHC: 35.2 g/dL (ref 32.0–36.0)
MCV: 98.7 fL (ref 80.0–100.0)
MONOS PCT: 12.1 %
MPV: 9.8 fL (ref 7.5–12.5)
NEUTROS ABS: 1837 {cells}/uL (ref 1500–7800)
NEUTROS PCT: 65.6 %
Platelets: 160 10*3/uL (ref 140–400)
RBC: 3.77 10*6/uL — AB (ref 3.80–5.10)
RDW: 12 % (ref 11.0–15.0)
Total Lymphocyte: 21.6 %
WBC mixed population: 339 cells/uL (ref 200–950)
WBC: 2.8 10*3/uL — AB (ref 3.8–10.8)

## 2017-05-10 LAB — T-HELPER CELL (CD4) - (RCID CLINIC ONLY)
CD4 T CELL HELPER: 47 % (ref 33–55)
CD4 T Cell Abs: 320 /uL — ABNORMAL LOW (ref 400–2700)

## 2017-05-11 LAB — HIV-1 RNA QUANT-NO REFLEX-BLD
HIV 1 RNA QUANT: NOT DETECTED {copies}/mL
HIV-1 RNA QUANT, LOG: NOT DETECTED {Log_copies}/mL

## 2017-05-22 ENCOUNTER — Ambulatory Visit (INDEPENDENT_AMBULATORY_CARE_PROVIDER_SITE_OTHER): Payer: Federal, State, Local not specified - PPO | Admitting: Internal Medicine

## 2017-05-22 ENCOUNTER — Encounter: Payer: Self-pay | Admitting: Internal Medicine

## 2017-05-22 DIAGNOSIS — B20 Human immunodeficiency virus [HIV] disease: Secondary | ICD-10-CM

## 2017-05-22 DIAGNOSIS — Z23 Encounter for immunization: Secondary | ICD-10-CM

## 2017-05-22 MED ORDER — ABACAVIR-DOLUTEGRAVIR-LAMIVUD 600-50-300 MG PO TABS: 1.0000 | ORAL_TABLET | Freq: Every day | ORAL | 3 refills | Status: DC

## 2017-05-22 NOTE — Progress Notes (Signed)
[place] [facility] for Infectious Disease  Patient Active Problem List   Diagnosis Date Noted  . Hemolytic anemia (HCC)     Priority: High  . Renal insufficiency 01/09/2012    Priority: High  . Dyslipidemia 05/11/2011    Priority: High  . Human immunodeficiency virus (HIV) disease ([place]) 10/11/2006    Priority: High  . History of cardiovascular disorder 10/02/2006    Priority: High  . Headache 10/14/2015  . Vertigo 10/14/2015  . Diplopia 10/14/2015  . Hand pain 04/22/2015  . 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 Elevated alkaline phosphatase level 01/14/2013  . Hip pain, left 03/08/2011  . ROTATOR CUFF SYNDROME, RIGHT 07/12/2010  . 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 RECTAL BLEEDING 10/11/2006  . SYMPTOM, ABNORMAL LOSS OF WEIGHT 10/11/2006  . 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 LIPODYSTROPHY 10/02/2006  . PERIPHERAL NEUROPATHY 10/02/2006  . ARTHROSCOPY, KNEE, HX OF 10/02/2006    Patient's Medications  New Prescriptions   No medications on file  Previous Medications   ACETAMINOPHEN (TYLENOL) 500 MG TABLET    Take 500 mg by mouth every 6 (six) hours as needed.   ATORVASTATIN (LIPITOR) 10 MG TABLET    Take 10 mg by mouth daily.   BETA CAROTENE W/MINERALS (OCUVITE) TABLET    Take 2 tablets by mouth daily.   CALCIUM CARBONATE (OS-CAL) 600 MG TABS    Take 1,200 mg by mouth daily.    CHOLECALCIFEROL (VITAMIN D-3) 1000 UNITS CAPS    Take 1,000 Units by mouth daily.   CLOPIDOGREL (PLAVIX) 75 MG TABLET    TAKE 1 TABLET BY MOUTH DAILY   DIAZEPAM (VALIUM) 2 MG TABLET    Take 1 tablet (2 mg total) by mouth every 6 (six) hours as needed for vertigo. May try 1/2 pill as well.   OMEGA-3 FATTY ACIDS (FISH OIL) 1000 MG CAPS    Take 1 capsule by mouth daily.    OXYCODONE-ACETAMINOPHEN (ROXICET) 5-325 MG TABLET    Take 1 tablet by mouth every 4 (four) hours as needed for severe pain.   ZOLPIDEM (AMBIEN) 10 MG TABLET    Take 1/2-1 tablet at bedtime as needed for  trouble sleeping  Modified Medications   Modified Medication Previous Medication   ABACAVIR-DOLUTEGRAVIR-LAMIVUDINE (TRIUMEQ) 600-50-300 MG TABLET abacavir-dolutegravir-lamiVUDine (TRIUMEQ) 600-50-300 MG tablet      Take 1 tablet by mouth daily.    Take 1 tablet by mouth daily.  Discontinued Medications   No medications on file    Subjective: Theresa Morrison is in for her routine HIV follow-up visit. It has been a little over a year since I have seen her. She has had no problems obtaining, taking or tolerating her Triumeq. She believes she has missed only one dose since her last visit. She recently underwent bilateral cataract surgery and states that her vision is much improved. She also had surgery on her left elbow for nerve compression and that is doing better as well.  Review of Systems: Review of Systems  Constitutional: Negative for chills, diaphoresis, fever, malaise/fatigue and weight loss.  HENT: Negative for sore throat.   Respiratory: Negative for cough, sputum production and shortness of breath.   Cardiovascular: Negative for chest pain.  Gastrointestinal: Negative for abdominal pain, diarrhea, heartburn, nausea and vomiting.  Genitourinary: Negative for dysuria and frequency.  Musculoskeletal: Negative for joint pain and myalgias.  Skin: Negative for rash.  Neurological: Positive for sensory change. Negative for dizziness and headaches.  Psychiatric/Behavioral: Negative for depression and substance abuse. The patient is not nervous/anxious.     Past Medical History:  Diagnosis Date  . Anemia   . Colon polyps    adenomatous  . CVA (cerebral vascular accident) ([place]) 2000  . Headache   . HIV positive ([place]) 1987  . Hypercholesterolemia   . Hyperlipidemia   . Internal hemorrhoids   . Shingles   . Toxoplasmosis     Social History  Substance Use Topics  . Smoking status: Never Smoker  . Smokeless tobacco: Never Used  . Alcohol use No    Family History  Problem  Relation Age of Onset  . Heart attack Father   . High Cholesterol Father   . Breast cancer Mother        mets to lung and brain  . Cancer Mother   . Prostate cancer Brother   . Stroke Neg Hx   . Migraines Neg Hx   . Neuropathy Neg Hx     Allergies  Allergen Reactions  . Penicillins     REACTION: facial swelling  . Stavudine     REACTION: peripheral neuropathy  . Sulfamethoxazole-Trimethoprim     REACTION: hives    Objective: Vitals:   05/22/17 1050  BP: 117/73  Pulse: (!) 59  Temp: 98.1 F (36.7 C)  TempSrc: Oral  Weight: 168 lb (76.2 kg)   Body mass index is 28.84 kg/m.  Physical Exam  Constitutional: She is oriented to person, place, and time.  She is in good spirits.  HENT:  Mouth/Throat: No oropharyngeal exudate.  Eyes: Conjunctivae are normal.  Cardiovascular: Normal rate and regular rhythm.   No murmur heard. Pulmonary/Chest: Effort normal and breath sounds normal.  Abdominal: Soft. She exhibits no mass. There is no tenderness.  No change in her lipoatrophy and central adiposity.  Musculoskeletal: Normal range of motion.  Neurological: She is alert and oriented to person, place, and time.  Skin: No rash noted.  Psychiatric: Mood and affect normal.    Lab Results    Problem List Items Addressed This Visit      High   Human immunodeficiency virus (HIV) disease ([place])    Her infection remains under excellent, long-term control. She received her influenza vaccination today. She will follow-up after lab work in one year and continue Triumeq.      Relevant Medications   abacavir-dolutegravir-lamiVUDine (TRIUMEQ) 937-16-967 MG tablet   Other Relevant Orders   CBC   T-helper cell (CD4)- ([facility] clinic only)   Lipid panel   RPR   HIV 1 RNA quant-no reflex-bld   Comprehensive metabolic panel       Michel Bickers, MD [place] for [place] [phone] pager   [phone] cell 05/22/2017, 11:13 AM

## 2017-05-22 NOTE — Assessment & Plan Note (Signed)
Her infection remains under excellent, long-term control. She received her influenza vaccination today. She will follow-up after lab work in one year and continue Triumeq.

## 2017-08-07 DIAGNOSIS — Z01419 Encounter for gynecological examination (general) (routine) without abnormal findings: Secondary | ICD-10-CM | POA: Diagnosis not present

## 2017-08-07 DIAGNOSIS — Z6829 Body mass index (BMI) 29.0-29.9, adult: Secondary | ICD-10-CM | POA: Diagnosis not present

## 2017-08-07 DIAGNOSIS — Z1231 Encounter for screening mammogram for malignant neoplasm of breast: Secondary | ICD-10-CM | POA: Diagnosis not present

## 2017-09-03 ENCOUNTER — Ambulatory Visit (HOSPITAL_COMMUNITY): Payer: Federal, State, Local not specified - PPO

## 2017-09-03 ENCOUNTER — Other Ambulatory Visit (HOSPITAL_COMMUNITY): Payer: Self-pay | Admitting: *Deleted

## 2017-09-03 ENCOUNTER — Other Ambulatory Visit (HOSPITAL_COMMUNITY): Payer: Federal, State, Local not specified - PPO

## 2017-09-03 DIAGNOSIS — D591 Other autoimmune hemolytic anemias: Principal | ICD-10-CM

## 2017-09-03 DIAGNOSIS — D5919 Other autoimmune hemolytic anemia: Secondary | ICD-10-CM

## 2017-09-04 ENCOUNTER — Inpatient Hospital Stay (HOSPITAL_COMMUNITY): Payer: Federal, State, Local not specified - PPO | Attending: Internal Medicine

## 2017-09-04 ENCOUNTER — Other Ambulatory Visit: Payer: Self-pay

## 2017-09-04 ENCOUNTER — Encounter (HOSPITAL_COMMUNITY): Payer: Self-pay | Admitting: Internal Medicine

## 2017-09-04 ENCOUNTER — Inpatient Hospital Stay (HOSPITAL_COMMUNITY): Payer: Federal, State, Local not specified - PPO | Attending: Internal Medicine | Admitting: Internal Medicine

## 2017-09-04 VITALS — BP 113/64 | HR 59 | Temp 97.9°F | Resp 16 | Ht 64.5 in | Wt 170.2 lb

## 2017-09-04 DIAGNOSIS — D696 Thrombocytopenia, unspecified: Secondary | ICD-10-CM | POA: Diagnosis not present

## 2017-09-04 DIAGNOSIS — D5919 Other autoimmune hemolytic anemia: Secondary | ICD-10-CM

## 2017-09-04 DIAGNOSIS — D591 Other autoimmune hemolytic anemias: Secondary | ICD-10-CM

## 2017-09-04 DIAGNOSIS — B20 Human immunodeficiency virus [HIV] disease: Secondary | ICD-10-CM | POA: Diagnosis not present

## 2017-09-04 DIAGNOSIS — Z8673 Personal history of transient ischemic attack (TIA), and cerebral infarction without residual deficits: Secondary | ICD-10-CM | POA: Diagnosis not present

## 2017-09-04 DIAGNOSIS — D589 Hereditary hemolytic anemia, unspecified: Secondary | ICD-10-CM | POA: Insufficient documentation

## 2017-09-04 LAB — COMPREHENSIVE METABOLIC PANEL
ALK PHOS: 144 U/L — AB (ref 38–126)
ALT: 24 U/L (ref 14–54)
AST: 26 U/L (ref 15–41)
Albumin: 4 g/dL (ref 3.5–5.0)
Anion gap: 10 (ref 5–15)
BUN: 8 mg/dL (ref 6–20)
CALCIUM: 9.2 mg/dL (ref 8.9–10.3)
CO2: 25 mmol/L (ref 22–32)
CREATININE: 1.15 mg/dL — AB (ref 0.44–1.00)
Chloride: 106 mmol/L (ref 101–111)
GFR calc Af Amer: 59 mL/min — ABNORMAL LOW (ref >60)
GFR, EST NON AFRICAN AMERICAN: 51 mL/min — AB (ref >60)
Glucose, Bld: 81 mg/dL (ref 65–99)
Potassium: 3.6 mmol/L (ref 3.5–5.1)
Sodium: 141 mmol/L (ref 135–145)
Total Bilirubin: 0.6 mg/dL (ref 0.3–1.2)
Total Protein: 6.4 g/dL — ABNORMAL LOW (ref 6.5–8.1)

## 2017-09-04 LAB — CBC WITH DIFFERENTIAL/PLATELET
BASOS ABS: 0 10*3/uL (ref 0.0–0.1)
Basophils Relative: 1 %
Eosinophils Absolute: 0 10*3/uL (ref 0.0–0.7)
Eosinophils Relative: 0 %
HEMATOCRIT: 39.9 % (ref 36.0–46.0)
HEMOGLOBIN: 13.4 g/dL (ref 12.0–15.0)
LYMPHS ABS: 0.7 10*3/uL (ref 0.7–4.0)
LYMPHS PCT: 27 %
MCH: 33.9 pg (ref 26.0–34.0)
MCHC: 33.6 g/dL (ref 30.0–36.0)
MCV: 101 fL — AB (ref 78.0–100.0)
Monocytes Absolute: 0.4 10*3/uL (ref 0.1–1.0)
Monocytes Relative: 13 %
NEUTROS ABS: 1.6 10*3/uL — AB (ref 1.7–7.7)
NEUTROS PCT: 59 %
Platelets: 152 10*3/uL (ref 150–400)
RBC: 3.95 MIL/uL (ref 3.87–5.11)
RDW: 11.7 % (ref 11.5–15.5)
WBC: 2.7 10*3/uL — AB (ref 4.0–10.5)

## 2017-09-04 NOTE — Patient Instructions (Signed)
[place] at [address] Discharge Instructions  RECOMMENDATIONS MADE BY THE CONSULTANT AND ANY TEST RESULTS WILL BE SENT TO YOUR REFERRING PHYSICIAN.  You were seen today by Dr. Mathis Dad Higgs. Follow up in 6 months with labs Continue to follow up with your primary care physician as scheduled.  Thank you for choosing [place] at [facility] to provide your oncology and hematology care.  To afford each patient quality time with our provider, please arrive at least 15 minutes before your scheduled appointment time.    If you have a lab appointment with the [facility] please come in thru the  Main Entrance and check in at the main information desk  You need to re-schedule your appointment should you arrive 10 or more minutes late.  We strive to give you quality time with our providers, and arriving late affects you and other patients whose appointments are after yours.  Also, if you no show three or more times for appointments you may be dismissed from the clinic at the providers discretion.     Again, thank you for choosing [facility].  Our hope is that these requests will decrease the amount of time that you wait before being seen by our physicians.       _____________________________________________________________  Should you have questions after your visit to [facility], please contact our office at [phone] between the hours of 8:30 a.m. and 4:30 p.m.  Voicemails left after 4:30 p.m. will not be returned until the following business day.  For prescription refill requests, have your pharmacy contact our office.       Resources For Cancer Patients and their Caregivers ? American Cancer Society: Can assist with transportation, wigs, general needs, runs Look Good Feel Better.        6191516066 ? Cancer Care: Provides financial assistance, online support groups, medication/co-pay assistance.   1-800-813-HOPE 463 153 8315) ? [place] Assists [place] cancer patients and their families through emotional , educational and financial support.  650-233-7459 ? [place] DSS Where to apply for food stamps, Medicaid and utility assistance. (743)376-2963 ? [facility]: Transportation to medical appointments. (517)609-6204 ? Social Security Administration: May apply for disability if have a Stage IV cancer. 918-823-6905 (272)827-2609 ? [employer], Disability and Transit Services: Assists with nutrition, care and transit needs. [place] Support Programs: @10RELATIVEDAYS @ > Cancer Support Group  2nd Tuesday of the month 1pm-2pm, Journey Room  > Creative Journey  3rd Tuesday of the month 1130am-1pm, Journey Room  > Look Good Feel Better  1st Wednesday of the month 10am-12 noon, Journey Room (Call [place] to register [phone])

## 2017-09-10 NOTE — Progress Notes (Signed)
Diagnosis Other autoimmune hemolytic anemias (HCC) - Plan: CBC with Differential/Platelet, Comprehensive metabolic panel, Lactate dehydrogenase, Haptoglobin  Staging Cancer Staging No matching staging information was found for the patient.  Assessment and Plan:  1.  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  HB 13.4.  She has no evidence of hemolysis at this time.  She will RTC in 6 months for followup and repeat labs.    2.  HIV+.  Continue to followup with Dr. Megan Salon as directed.    3.  Thrombocytopenia.  Plts WNL at 152,000.    Interval History:  Hemolytic anemia, coombs positive,  Ad[facility] to [facility]/2014 with Hb 3.2, reticulocyte percent at 23%, LDH 719, T bili 2.9, Haptoglobin < 25 and Coombs positive for IgG and complement Prednisone, now discontinued HIV positivity: on triumeq with excellent long-term control--followed by Dr Megan Salon H/O thrombocytopenia  She had relapsed disease and was placed on single agent rituxan and prednisone. Her hemolytic anemia is now controlled again.    Current Status:  Pt is seen today for followup.  She is doing well and denies any complaints.  She is here to go over labs.  She is no longer on Prednisone  Problem List Patient Active Problem List   Diagnosis Date Noted  . Headache [R51] 10/14/2015  . Vertigo [R42] 10/14/2015  . Diplopia [H53.2] 10/14/2015  . Hand pain [M79.643] 04/22/2015  . Hemolytic anemia ([place]) [D58.9]   . 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 Elevated alkaline phosphatase level [R74.8] 01/14/2013  . Renal insufficiency [N28.9] 01/09/2012  . Dyslipidemia [E78.5] 05/11/2011  . Hip pain, left [M25.552] 03/08/2011  . ROTATOR CUFF SYNDROME, RIGHT [M71.9, M67.919] 07/12/2010  . Human immunodeficiency virus (HIV) disease ([place]) [B20] 10/11/2006  . 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 RECTAL BLEEDING [K62.5] 10/11/2006  . SYMPTOM, ABNORMAL LOSS OF  WEIGHT [R63.4] 10/11/2006  . HERPES ZOSTER [B02.9] 10/02/2006  . CHLAMYDIA TRACHOMATIS, LOWER GU [A56.09] 10/02/2006  . LIPODYSTROPHY [E88.1] 10/02/2006  . PERIPHERAL NEUROPATHY [G60.9] 10/02/2006  . History of cardiovascular disorder [Z86.79] 10/02/2006  . ARTHROSCOPY, KNEE, HX OF [Z98.89] 10/02/2006    Past Medical History Past Medical History:  Diagnosis Date  . Anemia   . Colon polyps    adenomatous  . CVA (cerebral vascular accident) ([place]) 2000  . Headache   . HIV positive ([place]) 1987  . Hypercholesterolemia   . Hyperlipidemia   . Internal hemorrhoids   . Shingles   . Toxoplasmosis     Past Surgical History Past Surgical History:  Procedure Laterality Date  . APPENDECTOMY  2001  . KNEE ARTHROSCOPY Bilateral   . SHOULDER SURGERY Right   . TONSILLECTOMY    . ULNAR NERVE TRANSPOSITION Left 03/20/2017   Procedure: LEFT ELBOW ULNAR NERVE DECOMPRESSION;  Surgeon: [name], MD;  Location: [facility];  Service: Orthopedics;  Laterality: Left;    Family History Family History  Problem Relation Age of Onset  . Heart attack Father   . High Cholesterol Father   . Breast cancer Mother        mets to lung and brain  . Cancer Mother   . Prostate cancer Brother   . Stroke Neg Hx   . Migraines Neg Hx   . Neuropathy Neg Hx      Social History  reports that  has never smoked. she has never used smokeless tobacco. She reports that she does not drink alcohol or use  drugs.  Medications  Current Outpatient Medications:  .  abacavir-dolutegravir-lamiVUDine (TRIUMEQ) 600-50-300 MG tablet, Take 1 tablet by mouth daily., Disp: 90 tablet, Rfl: 3 .  acetaminophen (TYLENOL) 500 MG tablet, Take 500 mg by mouth every 6 (six) hours as needed., Disp: , Rfl:  .  atorvastatin (LIPITOR) 10 MG tablet, Take 10 mg by mouth daily., Disp: , Rfl:  .  beta carotene w/minerals (OCUVITE) tablet, Take 2 tablets by mouth daily., Disp: , Rfl:  .  calcium carbonate (OS-CAL) 600 MG  TABS, Take 1,200 mg by mouth daily. , Disp: , Rfl:  .  Cholecalciferol (VITAMIN D-3) 1000 units CAPS, Take 1,000 Units by mouth daily., Disp: , Rfl:  .  clopidogrel (PLAVIX) 75 MG tablet, TAKE 1 TABLET BY MOUTH DAILY, Disp: 90 tablet, Rfl: 1 .  diazepam (VALIUM) 2 MG tablet, Take 1 tablet (2 mg total) by mouth every 6 (six) hours as needed for vertigo. May try 1/2 pill as well., Disp: 20 tablet, Rfl: 4 .  Omega-3 Fatty Acids (FISH OIL) 1000 MG CAPS, Take 1 capsule by mouth daily. , Disp: , Rfl:  .  zolpidem (AMBIEN) 10 MG tablet, Take 1/2-1 tablet at bedtime as needed for trouble sleeping, Disp: 30 tablet, Rfl: 3  Allergies Penicillins; Stavudine; and Sulfamethoxazole-trimethoprim  Review of Systems Review of Systems - Oncology ROS as per HPI otherwise 12 point ROS is negative.   Physical Exam  Vitals Wt Readings from Last 3 Encounters:  09/04/17 170 lb 3.2 oz (77.2 kg)  05/22/17 168 lb (76.2 kg)  03/20/17 166 lb 6.4 oz (75.5 kg)   Temp Readings from Last 3 Encounters:  09/04/17 97.9 F (36.6 C) (Oral)  05/22/17 98.1 F (36.7 C) (Oral)  03/20/17 97.9 F (36.6 C) (Oral)   BP Readings from Last 3 Encounters:  09/04/17 113/64  05/22/17 117/73  03/20/17 104/67   Pulse Readings from Last 3 Encounters:  09/04/17 (!) 59  05/22/17 (!) 59  03/20/17 62   Constitutional: Well-developed, well-nourished, and in no distress.   HENT:  Head: Normocephalic and atraumatic.  Mouth/Throat: No oropharyngeal exudate. Mucosa moist. Eyes: Pupils are equal, round, and reactive to light. Conjunctivae are normal. No scleral icterus.  Neck: Normal range of motion. Neck supple. No JVD present.  Cardiovascular: Normal rate, regular rhythm and normal heart sounds.  Exam reveals no gallop and no friction rub.   No murmur heard. Pulmonary/Chest: Effort normal and breath sounds normal. No respiratory distress. No wheezes.No rales.  Abdominal: Soft. Bowel sounds are normal. No distension. There is no  tenderness. There is no guarding.  Musculoskeletal: No edema or tenderness.  Lymphadenopathy: No cervical, axillary or supraclavicular adenopathy.  Neurological: Alert and oriented to person, place, and time. No cranial nerve deficit.  Skin: Skin is warm and dry. No rash noted. No erythema. No pallor.  Psychiatric: Affect and judgment normal.   Labs Appointment on 09/04/2017  Component Date Value Ref Range Status  . WBC 09/04/2017 2.7* 4.0 - 10.5 K/uL Final  . RBC 09/04/2017 3.95  3.87 - 5.11 MIL/uL Final  . Hemoglobin 09/04/2017 13.4  12.0 - 15.0 g/dL Final  . HCT 09/04/2017 39.9  36.0 - 46.0 % Final  . MCV 09/04/2017 101.0* 78.0 - 100.0 fL Final  . MCH 09/04/2017 33.9  26.0 - 34.0 pg Final  . MCHC 09/04/2017 33.6  30.0 - 36.0 g/dL Final  . RDW 09/04/2017 11.7  11.5 - 15.5 % Final  . Platelets 09/04/2017 152  150 - 400  K/uL Final  . Neutrophils Relative % 09/04/2017 59  % Final  . Neutro Abs 09/04/2017 1.6* 1.7 - 7.7 K/uL Final  . Lymphocytes Relative 09/04/2017 27  % Final  . Lymphs Abs 09/04/2017 0.7  0.7 - 4.0 K/uL Final  . Monocytes Relative 09/04/2017 13  % Final  . Monocytes Absolute 09/04/2017 0.4  0.1 - 1.0 K/uL Final  . Eosinophils Relative 09/04/2017 0  % Final  . Eosinophils Absolute 09/04/2017 0.0  0.0 - 0.7 K/uL Final  . Basophils Relative 09/04/2017 1  % Final  . Basophils Absolute 09/04/2017 0.0  0.0 - 0.1 K/uL Final   Performed at [facility], [address] [place], [place] [address]  . Sodium 09/04/2017 141  135 - 145 mmol/L Final  . Potassium 09/04/2017 3.6  3.5 - 5.1 mmol/L Final  . Chloride 09/04/2017 106  101 - 111 mmol/L Final  . CO2 09/04/2017 25  22 - 32 mmol/L Final  . Glucose, Bld 09/04/2017 81  65 - 99 mg/dL Final  . BUN 09/04/2017 8  6 - 20 mg/dL Final  . Creatinine, Ser 09/04/2017 1.15* 0.44 - 1.00 mg/dL Final  . Calcium 09/04/2017 9.2  8.9 - 10.3 mg/dL Final  . Total Protein 09/04/2017 6.4* 6.5 - 8.1 g/dL Final  . Albumin 09/04/2017 4.0  3.5 -  5.0 g/dL Final  . AST 09/04/2017 26  15 - 41 U/L Final  . ALT 09/04/2017 24  14 - 54 U/L Final  . Alkaline Phosphatase 09/04/2017 144* 38 - 126 U/L Final  . Total Bilirubin 09/04/2017 0.6  0.3 - 1.2 mg/dL Final  . GFR calc non Af Amer 09/04/2017 51* >60 mL/min Final  . GFR calc Af Amer 09/04/2017 59* >60 mL/min Final   Comment: (NOTE) The eGFR has been calculated using the CKD EPI equation. This calculation has not been validated in all clinical situations. eGFR's persistently <60 mL/min signify possible Chronic Kidney Disease.   Georgiann Hahn gap 09/04/2017 10  5 - 15 Final   Performed at [facility], [address], [place], [place] [address]     Pathology Orders Placed This Encounter  Procedures  . CBC with Differential/Platelet    Standing Status:   Future    Standing Expiration Date:   03/04/2018  . Comprehensive metabolic panel    Standing Status:   Future    Standing Expiration Date:   03/04/2018  . Lactate dehydrogenase    Standing Status:   Future    Standing Expiration Date:   03/04/2018  . Haptoglobin    Standing Status:   Future    Standing Expiration Date:   03/04/2018       Zoila Shutter MD

## 2017-10-09 ENCOUNTER — Other Ambulatory Visit: Payer: Self-pay | Admitting: Internal Medicine

## 2017-10-09 DIAGNOSIS — B20 Human immunodeficiency virus [HIV] disease: Secondary | ICD-10-CM

## 2017-10-12 ENCOUNTER — Other Ambulatory Visit: Payer: Self-pay | Admitting: *Deleted

## 2017-10-12 DIAGNOSIS — B20 Human immunodeficiency virus [HIV] disease: Secondary | ICD-10-CM

## 2017-10-12 MED ORDER — ABACAVIR-DOLUTEGRAVIR-LAMIVUD 600-50-300 MG PO TABS: 1.0000 | ORAL_TABLET | Freq: Every day | ORAL | 3 refills | Status: DC

## 2017-10-31 DIAGNOSIS — E6609 Other obesity due to excess calories: Secondary | ICD-10-CM | POA: Diagnosis not present

## 2017-10-31 DIAGNOSIS — Z86010 Personal history of colonic polyps: Secondary | ICD-10-CM | POA: Diagnosis not present

## 2017-10-31 DIAGNOSIS — Z8673 Personal history of transient ischemic attack (TIA), and cerebral infarction without residual deficits: Secondary | ICD-10-CM | POA: Diagnosis not present

## 2017-10-31 DIAGNOSIS — E782 Mixed hyperlipidemia: Secondary | ICD-10-CM | POA: Diagnosis not present

## 2017-10-31 DIAGNOSIS — Z6830 Body mass index (BMI) 30.0-30.9, adult: Secondary | ICD-10-CM | POA: Diagnosis not present

## 2017-10-31 DIAGNOSIS — Z0001 Encounter for general adult medical examination with abnormal findings: Secondary | ICD-10-CM | POA: Diagnosis not present

## 2017-10-31 DIAGNOSIS — B20 Human immunodeficiency virus [HIV] disease: Secondary | ICD-10-CM | POA: Diagnosis not present

## 2017-10-31 DIAGNOSIS — Z1389 Encounter for screening for other disorder: Secondary | ICD-10-CM | POA: Diagnosis not present

## 2017-10-31 DIAGNOSIS — E559 Vitamin D deficiency, unspecified: Secondary | ICD-10-CM | POA: Diagnosis not present

## 2017-12-20 DIAGNOSIS — H353132 Nonexudative age-related macular degeneration, bilateral, intermediate dry stage: Secondary | ICD-10-CM | POA: Diagnosis not present

## 2017-12-20 DIAGNOSIS — Z961 Presence of intraocular lens: Secondary | ICD-10-CM | POA: Diagnosis not present

## 2017-12-20 DIAGNOSIS — H26493 Other secondary cataract, bilateral: Secondary | ICD-10-CM | POA: Diagnosis not present

## 2017-12-20 DIAGNOSIS — H43393 Other vitreous opacities, bilateral: Secondary | ICD-10-CM | POA: Diagnosis not present

## 2018-01-09 ENCOUNTER — Telehealth: Payer: Self-pay

## 2018-01-09 NOTE — Telephone Encounter (Signed)
PT called today stating she is not feeling well and believes that it is related to her Triumeq.  PT is complaining of nausea and fatigue which is new to her. Pt is scheduled to see pharmacy 01/10/18 at  9:30 am. Will inform Dr. Megan Salon that the pt is being seen with pharmacy regarding concerns on her Triumeq. [place]

## 2018-01-10 ENCOUNTER — Ambulatory Visit (INDEPENDENT_AMBULATORY_CARE_PROVIDER_SITE_OTHER): Payer: Federal, State, Local not specified - PPO | Admitting: Pharmacist Clinician (PhC)/ Clinical Pharmacy Specialist

## 2018-01-10 DIAGNOSIS — B20 Human immunodeficiency virus [HIV] disease: Secondary | ICD-10-CM | POA: Diagnosis not present

## 2018-01-10 DIAGNOSIS — Z23 Encounter for immunization: Secondary | ICD-10-CM

## 2018-01-10 MED ORDER — ONDANSETRON HCL 4 MG PO TABS: 4.0000 mg | ORAL_TABLET | Freq: Three times a day (TID) | ORAL | 2 refills | Status: DC | PRN

## 2018-01-10 MED ORDER — ZOSTER VAC RECOMB ADJUVANTED 50 MCG/0.5ML IM SUSR: 0.5000 mL | INTRAMUSCULAR | 1 refills | Status: DC

## 2018-01-10 MED FILL — SHINGRIX 50 MCG SUS: 50 | 1 days supply | Qty: 1 | Fill #0

## 2018-01-10 NOTE — Progress Notes (Addendum)
HPI: Theresa Morrison is a 62 y.o. female who presents to the [facility] for an HIV follow-up and possible Triumeq side effects.  Allergies: Allergies  Allergen Reactions  . Penicillins     REACTION: facial swelling  . Stavudine     REACTION: peripheral neuropathy  . Sulfamethoxazole-Trimethoprim     REACTION: hives  . Sulfa Antibiotics Rash   Past Medical History: Past Medical History:  Diagnosis Date  . Anemia   . Colon polyps    adenomatous  . CVA (cerebral vascular accident) ([place]) 2000  . Headache   . HIV positive ([place]) 1987  . Hypercholesterolemia   . Hyperlipidemia   . Internal hemorrhoids   . Shingles   . Toxoplasmosis    Social History: Social History   Socioeconomic History  . Marital status: Married    Spouse name: Theresa Morrison  . Number of children: 2  . Years of education: 87  . Highest education level: Not on file  Occupational History    Employer: UNEMPLOYED  Social Needs  . Financial resource strain: Not on file  . Food insecurity:    Worry: Not on file    Inability: Not on file  . Transportation needs:    Medical: Not on file    Non-medical: Not on file  Tobacco Use  . Smoking status: Never Smoker  . Smokeless tobacco: Never Used  Substance and Sexual Activity  . Alcohol use: No    Alcohol/week: 0.0 oz  . Drug use: No  . Sexual activity: Not Currently    Comment: declined condoms  Lifestyle  . Physical activity:    Days per week: Not on file    Minutes per session: Not on file  . Stress: Not on file  Relationships  . Social connections:    Talks on phone: Not on file    Gets together: Not on file    Attends religious service: Not on file    Active member of club or organization: Not on file    Attends meetings of clubs or organizations: Not on file    Relationship status: Not on file  Other Topics Concern  . Not on file  Social History Narrative   Lives with spouse and son   Caffeine use: 12oz daily   Previous  Regimen: Stavudine, others, diagnosed in the 76's  Current Regimen: Triumeq  Labs: HIV 1 RNA Quant (copies/mL)  Date Value  05/09/2017 <20 NOT DETECTED  04/06/2016 <20  10/05/2015 <20   CD4 T Cell Abs (/uL)  Date Value  05/09/2017 320 (L)  04/06/2016 260 (L)  10/05/2015 200 (L)   Hep B S Ab (no units)  Date Value  06/29/2016 Non Reactive   Hepatitis B Surface Ag (no units)  Date Value  06/29/2016 Negative   HCV Ab (no units)  Date Value  09/17/2006 No   CrCl: CrCl cannot be calculated (Patient's most recent lab result is older than the maximum 21 days allowed.).  Lipids:    Component Value Date/Time   CHOL 158 09/15/2016 0936   TRIG 123 09/15/2016 0936   HDL 44 09/15/2016 0936   CHOLHDL 3.6 09/15/2016 0936   VLDL 25 09/15/2016 0936   LDLCALC 89 09/15/2016 0936   Assessment: Theresa Morrison presents to clinic today with complaints of nausea and fatigue, with concerns it is due to her Triumeq. She was questioned whether she started any new medications, of which she has only started Vitamin D 50k units weekly. She has not adopted  any new food choices or diet choices that she can remember. She says it does not happen everyday but that it happens every couple of days. She was given two options: switch to [place] or start Zofran and continue Triumeq. She opted to try Zofran since she is comfortable taking her Triumeq.  She was noted to not have Hepatitis B protection, and has not been previously tested for Hepatitis A protection. She will get Hepatitis A labs today and begin the Hepatitis B vaccine series. She has had shingles two times in the past, her first encounter being in 7th grade, and has not received the Zoster vaccine. She was given a prescription to start the Shingrix series with our [facility].   Recommendations: Hep B vaccine #1 today Hep A labs today Start Shingrix at [facility]  F/u with pharmacy 7/23 for 2nd Hep B vaccines, and possible start Hep A  vaccines HIV VL labs 11/6 Dr. Megan Salon f/u 11/20    Bridgett Larsson, PharmD, [facility] 01/10/2018, 10:16 AM   Agreed with [name]'s note. Since she is on the conservative side, she'd rather try zofran route first.   Onnie Boer, PharmD, BCPS, AAHIVP, CPP Infectious Disease Pharmacist Pager: [phone] 01/10/2018 11:30 AM

## 2018-01-11 LAB — HEPATITIS A ANTIBODY, TOTAL: HEPATITIS A AB,TOTAL: NONREACTIVE

## 2018-02-04 IMAGING — MR MR BILATERAL BREAST WITHOUT AND WITH CONTRAST
8 of 12 series · 33 of 48 positions shown · IV contrast (15ml multihance)
Comparison: Previous exam(s).

CLINICAL DATA: 60-year-old female with a dense fibroglandular
pattern. Family history of breast cancer. The patient's lifetime
risk for developing breast cancer is estimated at 28.5%.

LABS:  None obtained at the time of imaging.
EXAM:
BILATERAL BREAST MRI WITH AND WITHOUT CONTRAST
TECHNIQUE: Multiplanar, multisequence MR images of both breasts were obtained
prior to and following the intravenous administration of 15 ml of
MultiHance.

[Series 3: t2_tirm_tra ipat (a-p) · axial · 3.0mm · 0.70mm/px · 1 of 55 slices shown]
[im 1/55]
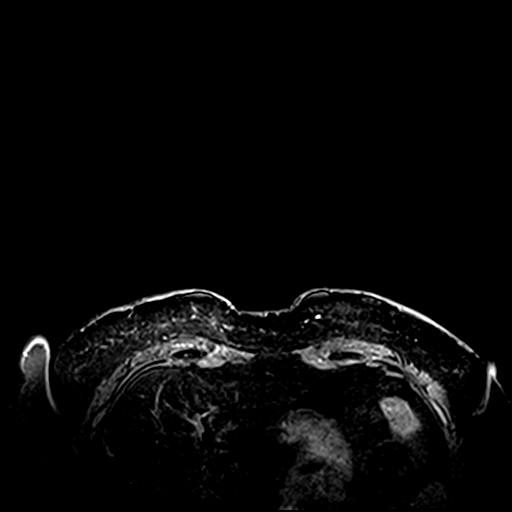

[Series 4: fl3d pre-cm no · axial · non-contrast · 1.2mm · 0.94mm/px · z∈[-49,+123]mm · 5 of 144 slices shown]
[im 1/144]
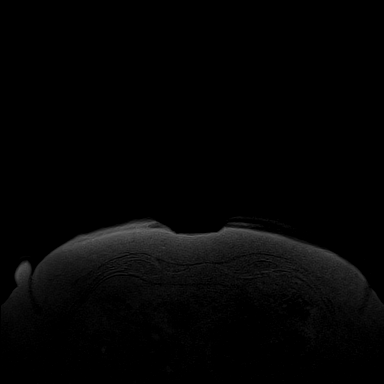
[im 36/144]
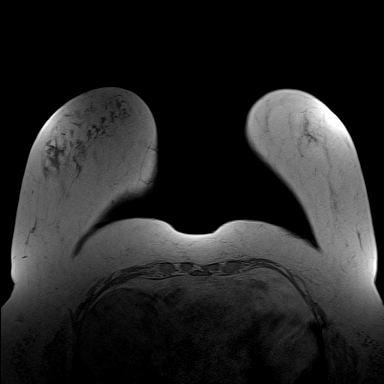
[im 72/144]
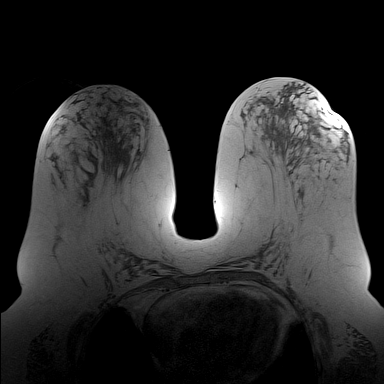
[im 108/144]
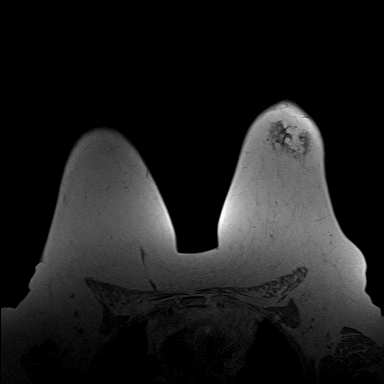
[im 144/144]
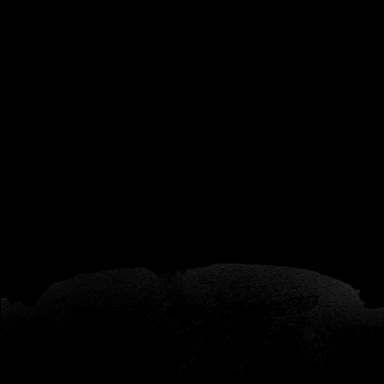

[Series 5: fl3d pre-cm · axial · non-contrast · 1.2mm · 0.94mm/px · z∈[-49,+123]mm · 5 of 144 slices shown]
[im 1/144]
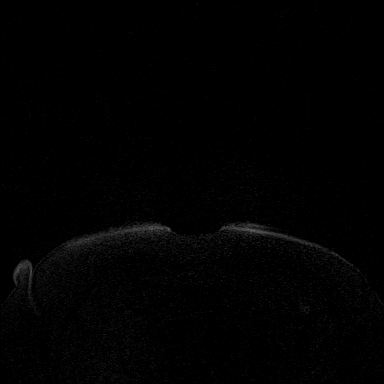
[im 36/144]
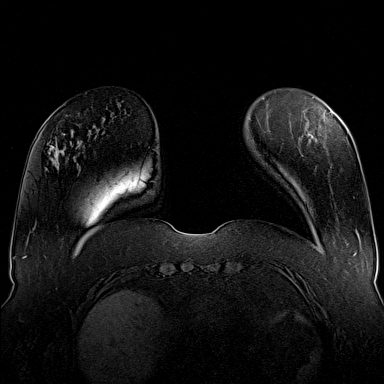
[im 72/144]
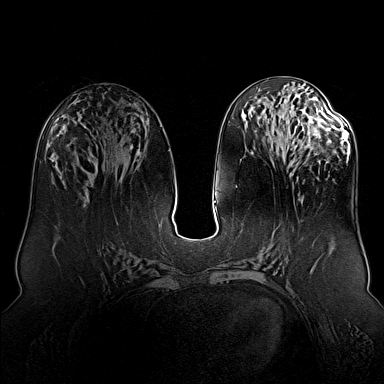
[im 108/144]
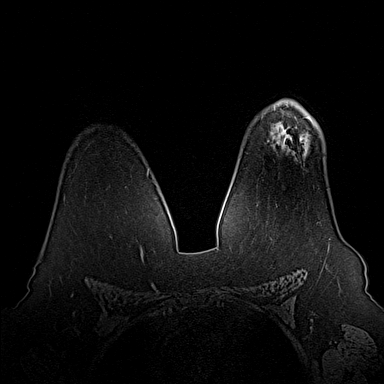
[im 144/144]
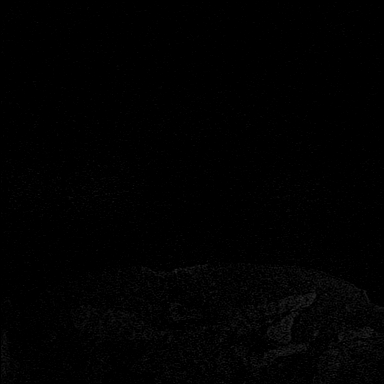

[Series 6: fl3d post immediate · axial · 1.2mm · 0.94mm/px · z∈[-49,+123]mm · 5 of 144 slices shown (1 of 3)]
[im 1/144]
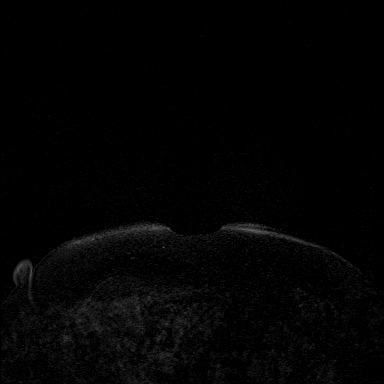
[im 36/144]
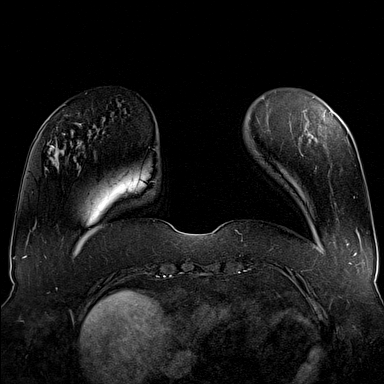
[im 72/144]
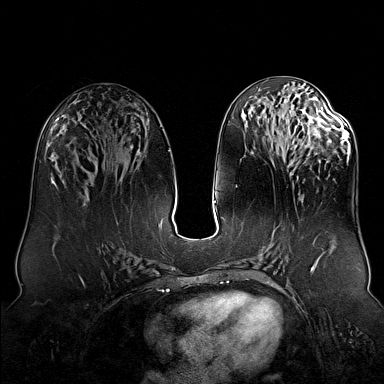
[im 108/144]
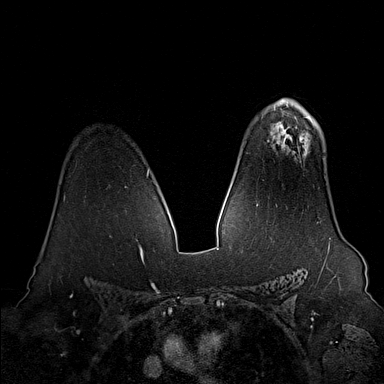
[im 144/144]
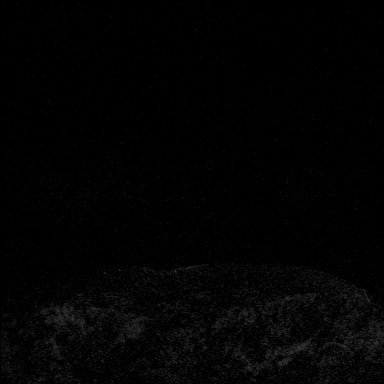

[Series 7: fl3d post immediate · axial · 1.2mm · 0.94mm/px · z∈[-49,+123]mm · 5 of 144 slices shown (2 of 3)]
[im 1/144]
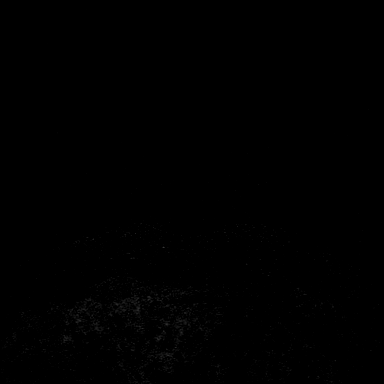
[im 36/144]
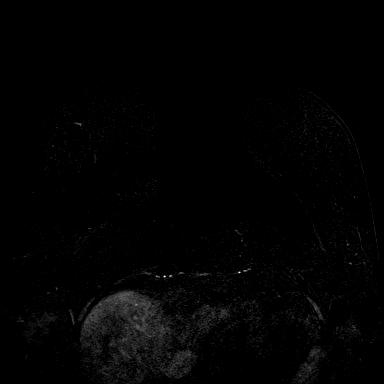
[im 72/144]
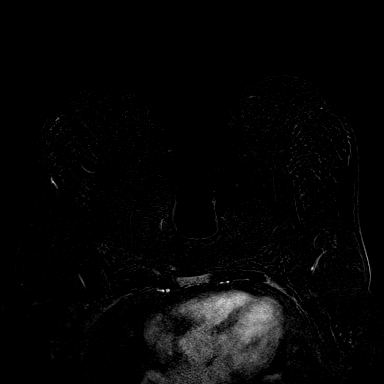
[im 108/144]
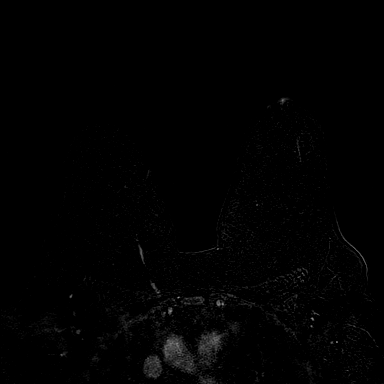
[im 144/144]
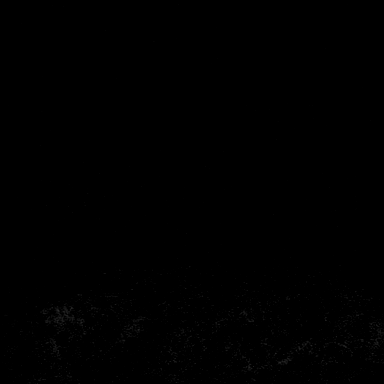

[Series 8: fl3d post immediate · axial · 172.8mm · 0.94mm/px · 1 of 1 slices shown (3 of 3)]
[im 1/1]
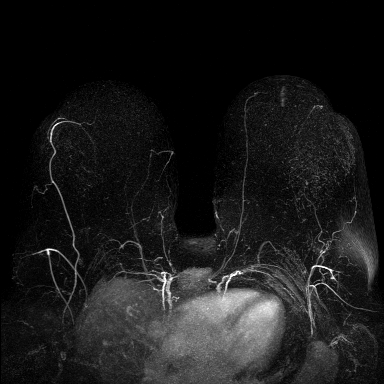

[Series 9: fl3d post 3min · axial · 1.2mm · 0.94mm/px · z∈[-49,+123]mm · 6 of 144 slices shown]
[im 1/144]
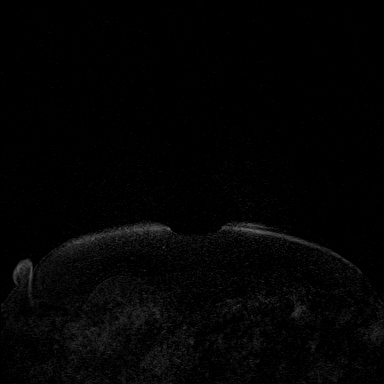
[im 29/144]
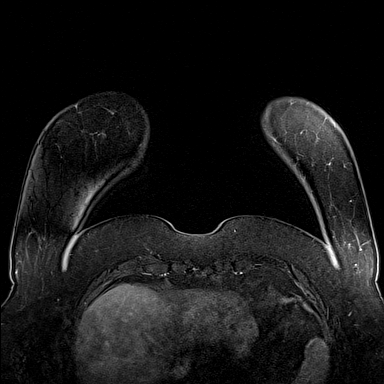
[im 58/144]
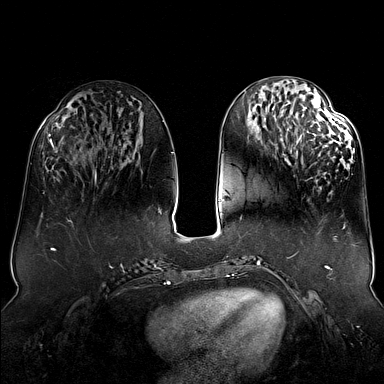
[im 86/144]
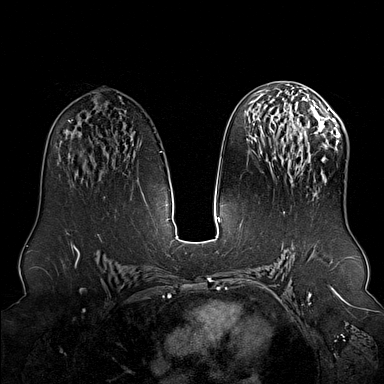
[im 115/144]
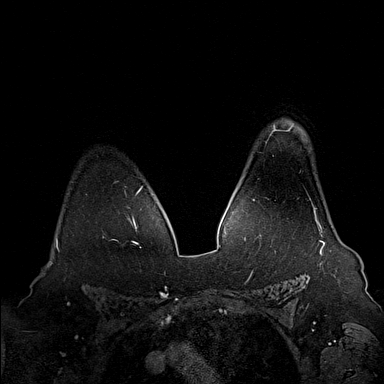
[im 144/144]
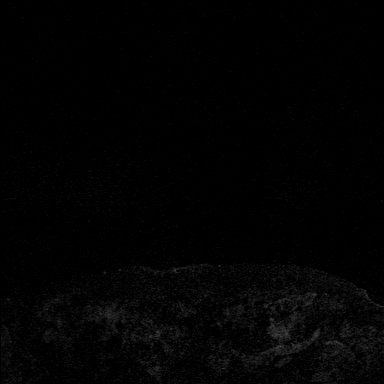

[Series 10: fl3d post 3min_sub · axial · 1.2mm · 0.94mm/px · z∈[-49,+88]mm · 5 of 144 slices shown]
[im 1/144]
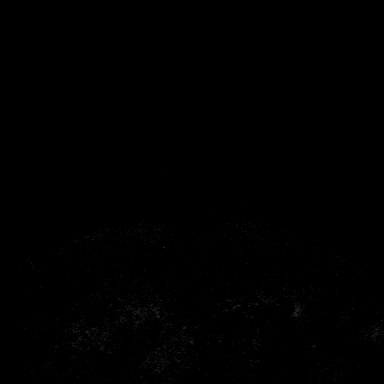
[im 29/144]
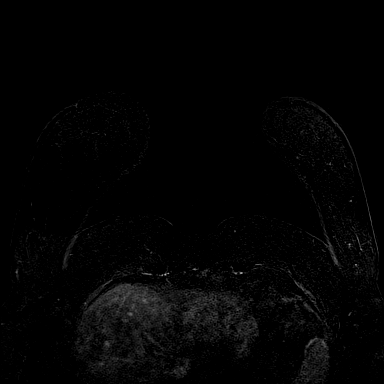
[im 58/144]
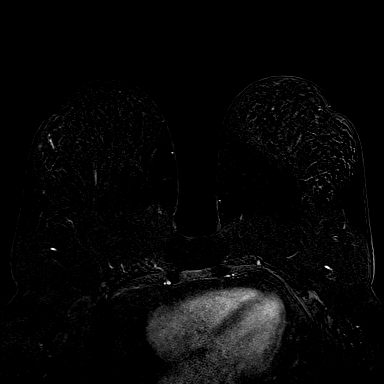
[im 86/144]
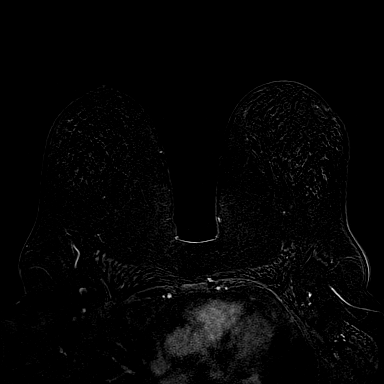
[im 115/144]
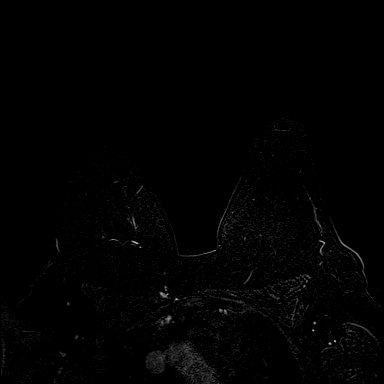

[33 of 48 positions shown; findings below may reference images not displayed]

THREE-DIMENSIONAL MR IMAGE RENDERING ON INDEPENDENT WORKSTATION:

Three-dimensional MR images were rendered by post-processing of the
original MR data on an independent workstation. The
three-dimensional MR images were interpreted, and findings are
reported in the following complete MRI report for this study. Three
dimensional images were evaluated at the independent [employer]
workstation
FINDINGS: Breast composition: c. Heterogeneous fibroglandular tissue.

Background parenchymal enhancement: Moderate.

Right breast: No mass or abnormal enhancement.

Left breast: No mass or abnormal enhancement.

Lymph nodes: No abnormal appearing lymph nodes.

Ancillary findings:  None.
IMPRESSION: No abnormal enhancement in either breast.

RECOMMENDATION:
Bilateral screening mammogram in Wednesday May, 2017 is recommended.

The American Cancer Society recommends annual MRI and mammography in
patients with an estimated lifetime risk of developing breast cancer
greater than 20 - 25%, or who are known or suspected to be positive
for the breast cancer gene.

BI-RADS CATEGORY  1: Negative.

## 2018-02-12 ENCOUNTER — Ambulatory Visit (INDEPENDENT_AMBULATORY_CARE_PROVIDER_SITE_OTHER): Payer: Federal, State, Local not specified - PPO | Admitting: Pharmacist

## 2018-02-12 DIAGNOSIS — Z23 Encounter for immunization: Secondary | ICD-10-CM

## 2018-02-12 DIAGNOSIS — B20 Human immunodeficiency virus [HIV] disease: Secondary | ICD-10-CM | POA: Diagnosis not present

## 2018-02-12 NOTE — Progress Notes (Signed)
   [facility] Pharmacy Vaccination Visit  HPI: PEGGE CUMBERLEDGE is a 62 y.o. female who presents to the [facility] clinic for her 2nd Hepatitis B vaccine.  Hepatitis B Lab Results  Component Value Date   HEPBSAB Non Reactive 06/29/2016   Lab Results  Component Value Date   HEPBSAG Negative 06/29/2016    Hepatitis C Lab Results  Component Value Date   HCVAB No 09/17/2006    Hepatitis A Lab Results  Component Value Date   HAV NON-REACTIVE 01/10/2018    Assessment: Myha is here today to get her 2nd Hepatitis B vaccine.  She also has no immunity to Hepatitis A, so we will start that vaccine today too.  Of note, she continues on Triumeq and her nausea is much better since she has started taking the medication with food on her stomach.  She is not using zofran.   Plan: - 2nd Hepaitits B vaccine - 1st Hepaitits A vaccine - Continue Triumeq PO once daily - F/u for labs 11/6 at 9am - F/u with Dr. Megan Salon 11/20 at 145pm - F/u with me for last vaccines 12/17 at 915am  Mahum Betten L. Raedyn Klinck, PharmD, [place], [facility] 02/12/2018, 9:54 AM

## 2018-02-26 ENCOUNTER — Other Ambulatory Visit (HOSPITAL_COMMUNITY): Payer: Federal, State, Local not specified - PPO

## 2018-03-05 ENCOUNTER — Ambulatory Visit (HOSPITAL_COMMUNITY): Payer: Federal, State, Local not specified - PPO | Admitting: Internal Medicine

## 2018-03-21 MED FILL — SHINGRIX 50 MCG SUS: 50 | 1 days supply | Qty: 1 | Fill #1

## 2018-03-28 MED FILL — BOOSTRIX VACCINE SYRINGE: 5-2.5-18.5 | 1 days supply | Qty: 1 | Fill #0

## 2018-05-01 DIAGNOSIS — Z23 Encounter for immunization: Secondary | ICD-10-CM | POA: Diagnosis not present

## 2018-05-09 DIAGNOSIS — Z23 Encounter for immunization: Secondary | ICD-10-CM | POA: Diagnosis not present

## 2018-05-29 ENCOUNTER — Other Ambulatory Visit: Payer: Federal, State, Local not specified - PPO

## 2018-05-29 ENCOUNTER — Other Ambulatory Visit: Payer: Self-pay | Admitting: Behavioral Health

## 2018-05-29 DIAGNOSIS — Z113 Encounter for screening for infections with a predominantly sexual mode of transmission: Secondary | ICD-10-CM

## 2018-05-29 DIAGNOSIS — D5919 Other autoimmune hemolytic anemia: Secondary | ICD-10-CM

## 2018-05-29 DIAGNOSIS — Z79899 Other long term (current) drug therapy: Secondary | ICD-10-CM | POA: Diagnosis not present

## 2018-05-29 DIAGNOSIS — B20 Human immunodeficiency virus [HIV] disease: Secondary | ICD-10-CM

## 2018-05-29 DIAGNOSIS — D591 Other autoimmune hemolytic anemias: Principal | ICD-10-CM

## 2018-05-30 LAB — T-HELPER CELL (CD4) - (RCID CLINIC ONLY)
CD4 % Helper T Cell: 52 % (ref 33–55)
CD4 T Cell Abs: 280 /uL — ABNORMAL LOW (ref 400–2700)

## 2018-05-31 LAB — COMPREHENSIVE METABOLIC PANEL
AG Ratio: 2.6 (calc) — ABNORMAL HIGH (ref 1.0–2.5)
ALT: 17 U/L (ref 6–29)
AST: 17 U/L (ref 10–35)
Albumin: 4.2 g/dL (ref 3.6–5.1)
Alkaline phosphatase (APISO): 161 U/L — ABNORMAL HIGH (ref 33–130)
BILIRUBIN TOTAL: 0.5 mg/dL (ref 0.2–1.2)
BUN/Creatinine Ratio: 8 (calc) (ref 6–22)
BUN: 8 mg/dL (ref 7–25)
CALCIUM: 9 mg/dL (ref 8.6–10.4)
CO2: 27 mmol/L (ref 20–32)
Chloride: 110 mmol/L (ref 98–110)
Creat: 1.05 mg/dL — ABNORMAL HIGH (ref 0.50–0.99)
GLUCOSE: 82 mg/dL (ref 65–99)
Globulin: 1.6 g/dL (calc) — ABNORMAL LOW (ref 1.9–3.7)
Potassium: 3.7 mmol/L (ref 3.5–5.3)
SODIUM: 145 mmol/L (ref 135–146)
TOTAL PROTEIN: 5.8 g/dL — AB (ref 6.1–8.1)

## 2018-05-31 LAB — LIPID PANEL
CHOL/HDL RATIO: 3 (calc) (ref <5.0)
CHOLESTEROL: 126 mg/dL (ref <200)
HDL: 42 mg/dL — ABNORMAL LOW (ref >50)
LDL CHOLESTEROL (CALC): 68 mg/dL
Non-HDL Cholesterol (Calc): 84 mg/dL (calc) (ref <130)
Triglycerides: 84 mg/dL (ref <150)

## 2018-05-31 LAB — CBC WITH DIFFERENTIAL/PLATELET
Basophils Absolute: 10 cells/uL (ref 0–200)
Basophils Relative: 0.3 %
EOS ABS: 0 {cells}/uL — AB (ref 15–500)
Eosinophils Relative: 0 %
HCT: 38.9 % (ref 35.0–45.0)
HEMOGLOBIN: 13.5 g/dL (ref 11.7–15.5)
Lymphs Abs: 509 cells/uL — ABNORMAL LOW (ref 850–3900)
MCH: 34.7 pg — AB (ref 27.0–33.0)
MCHC: 34.7 g/dL (ref 32.0–36.0)
MCV: 100 fL (ref 80.0–100.0)
MONOS PCT: 10.6 %
MPV: 9.6 fL (ref 7.5–12.5)
NEUTROS ABS: 2342 {cells}/uL (ref 1500–7800)
Neutrophils Relative %: 73.2 %
PLATELETS: 158 10*3/uL (ref 140–400)
RBC: 3.89 10*6/uL (ref 3.80–5.10)
RDW: 11.8 % (ref 11.0–15.0)
TOTAL LYMPHOCYTE: 15.9 %
WBC mixed population: 339 cells/uL (ref 200–950)
WBC: 3.2 10*3/uL — ABNORMAL LOW (ref 3.8–10.8)

## 2018-05-31 LAB — HIV-1 RNA QUANT-NO REFLEX-BLD
HIV 1 RNA QUANT: NOT DETECTED {copies}/mL
HIV-1 RNA QUANT, LOG: NOT DETECTED {Log_copies}/mL

## 2018-05-31 LAB — RPR: RPR Ser Ql: NONREACTIVE

## 2018-06-12 ENCOUNTER — Ambulatory Visit (INDEPENDENT_AMBULATORY_CARE_PROVIDER_SITE_OTHER): Payer: Federal, State, Local not specified - PPO | Admitting: Internal Medicine

## 2018-06-12 ENCOUNTER — Encounter: Payer: Self-pay | Admitting: Internal Medicine

## 2018-06-12 DIAGNOSIS — B20 Human immunodeficiency virus [HIV] disease: Secondary | ICD-10-CM

## 2018-06-12 NOTE — Assessment & Plan Note (Signed)
Her infection remains under excellent, long-term control.  She will continue Triumeq and follow-up after lab work in 1 year. 

## 2018-06-12 NOTE — Progress Notes (Signed)
Patient Active Problem List   Diagnosis Date Noted  . Hemolytic anemia (HCC)     Priority: High  . Renal insufficiency 01/09/2012    Priority: High  . Dyslipidemia 05/11/2011    Priority: High  . Human immunodeficiency virus (HIV) disease ([place]) 10/11/2006    Priority: High  . History of cardiovascular disorder 10/02/2006    Priority: High  . Headache 10/14/2015  . Vertigo 10/14/2015  . Diplopia 10/14/2015  . Hand pain 04/22/2015  . 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 Elevated alkaline phosphatase level 01/14/2013  . Hip pain, left 03/08/2011  . ROTATOR CUFF SYNDROME, RIGHT 07/12/2010  . 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 RECTAL BLEEDING 10/11/2006  . SYMPTOM, ABNORMAL LOSS OF WEIGHT 10/11/2006  . 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 LIPODYSTROPHY 10/02/2006  . PERIPHERAL NEUROPATHY 10/02/2006  . ARTHROSCOPY, KNEE, HX OF 10/02/2006    Patient's Medications  New Prescriptions   No medications on file  Previous Medications   ABACAVIR-DOLUTEGRAVIR-LAMIVUDINE (TRIUMEQ) 600-50-300 MG TABLET    Take 1 tablet by mouth daily.   ACETAMINOPHEN (TYLENOL) 500 MG TABLET    Take 500 mg by mouth every 6 (six) hours as needed.   ATORVASTATIN (LIPITOR) 10 MG TABLET    Take 10 mg by mouth daily.   BETA CAROTENE W/MINERALS (OCUVITE) TABLET    Take 2 tablets by mouth daily.   CALCIUM CARBONATE (OS-CAL) 600 MG TABS    Take 1,200 mg by mouth daily.    CHOLECALCIFEROL (VITAMIN D-3) 1000 UNITS CAPS    Take 1,000 Units by mouth daily.   CLOPIDOGREL (PLAVIX) 75 MG TABLET    TAKE 1 TABLET BY MOUTH DAILY   DIAZEPAM (VALIUM) 2 MG TABLET    Take 1 tablet (2 mg total) by mouth every 6 (six) hours as needed for vertigo. May try 1/2 pill as well.   OMEGA-3 FATTY ACIDS (FISH OIL) 1000 MG CAPS    Take 1 capsule by mouth daily.    ONDANSETRON (ZOFRAN) 4 MG TABLET    Take 1 tablet (4 mg total) by mouth every 8 (eight) hours as needed for nausea or vomiting.   VITAMIN D,  ERGOCALCIFEROL, (DRISDOL) 50000 UNITS CAPS CAPSULE    Take 50,000 Units by mouth every 7 (seven) days.   ZOLPIDEM (AMBIEN) 10 MG TABLET    Take 1/2-1 tablet at bedtime as needed for trouble sleeping   ZOSTER VACCINE ADJUVANTED (SHINGRIX) INJECTION    Inject 0.5 mLs into the muscle every 8 (eight) weeks.  Modified Medications   No medications on file  Discontinued Medications   No medications on file    Subjective: Theresa Morrison is in for her routine HIV follow-up visit.  She has had no problems obtaining, taking or tolerating her Triumeq since her last visit.  She denies missing any doses.  When last seen she was having some nausea and fatigue.  She started taking her Triumeq with meals and her nausea and fatigue resolved promptly.  She has already had her influenza vaccine and a pneumococcal vaccine booster.  She has no current health concerns other than trying to get over a bad cold that started about 1 month ago.  Review of Systems: Review of Systems  Constitutional: Negative for chills, diaphoresis, fever and malaise/fatigue.  HENT: Positive for congestion. Negative for sore throat.   Respiratory: Negative for cough, sputum production and shortness of breath.   Cardiovascular: Negative for chest pain.  Gastrointestinal: Negative for abdominal pain, diarrhea, nausea and vomiting.  Skin: Negative for rash.    Past Medical History:  Diagnosis Date  . Anemia   . Colon polyps    adenomatous  . CVA (cerebral vascular accident) ([place]) 2000  . Headache   . HIV positive ([place]) 1987  . Hypercholesterolemia   . Hyperlipidemia   . Internal hemorrhoids   . Shingles   . Toxoplasmosis     Social History   Tobacco Use  . Smoking status: Never Smoker  . Smokeless tobacco: Never Used  Substance Use Topics  . Alcohol use: No    Alcohol/week: 0.0 standard drinks  . Drug use: No    Family History  Problem Relation Age of Onset  . Heart attack Father   . High Cholesterol Father   . Breast  cancer Mother        mets to lung and brain  . Cancer Mother   . Prostate cancer Brother   . Stroke Neg Hx   . Migraines Neg Hx   . Neuropathy Neg Hx     Allergies  Allergen Reactions  . Penicillins     REACTION: facial swelling  . Stavudine     REACTION: peripheral neuropathy  . Sulfamethoxazole-Trimethoprim     REACTION: hives  . Sulfa Antibiotics Rash    Health Maintenance  Topic Date Due  . MAMMOGRAM  02/11/2016  . PAP SMEAR  02/12/2016  . INFLUENZA VACCINE  02/21/2018  . COLONOSCOPY  08/18/2018  . TETANUS/TDAP  03/28/2028  . Hepatitis C Screening  Completed  . HIV Screening  Completed    Objective:  Vitals:   06/12/18 1352  BP: 107/68  Pulse: 71  Temp: 97.6 F (36.4 C)  Weight: 168 lb (76.2 kg)   Body mass index is 28.39 kg/m.  Physical Exam  Constitutional: She is oriented to person, place, and time.  She is in good spirits.  HENT:  Mouth/Throat: No oropharyngeal exudate.  Cardiovascular: Normal rate, regular rhythm and normal heart sounds.  Pulmonary/Chest: Effort normal and breath sounds normal.  Neurological: She is alert and oriented to person, place, and time.  Skin: No rash noted.  Psychiatric: She has a normal mood and affect.    Lab Results Lab Results  Component Value Date   WBC 3.2 (L) 05/29/2018   HGB 13.5 05/29/2018   HCT 38.9 05/29/2018   MCV 100.0 05/29/2018   PLT 158 05/29/2018    Lab Results  Component Value Date   CREATININE 1.05 (H) 05/29/2018   BUN 8 05/29/2018   NA 145 05/29/2018   K 3.7 05/29/2018   CL 110 05/29/2018   CO2 27 05/29/2018    Lab Results  Component Value Date   ALT 17 05/29/2018   AST 17 05/29/2018   ALKPHOS 144 (H) 09/04/2017   BILITOT 0.5 05/29/2018    Lab Results  Component Value Date   CHOL 126 05/29/2018   HDL 42 (L) 05/29/2018   LDLCALC 68 05/29/2018   TRIG 84 05/29/2018   CHOLHDL 3.0 05/29/2018   Lab Results  Component Value Date   LABRPR NON-REACTIVE 05/29/2018   HIV 1 RNA  Quant (copies/mL)  Date Value  05/29/2018 <20 NOT DETECTED  05/09/2017 <20 NOT DETECTED  04/06/2016 <20   CD4 T Cell Abs (/uL)  Date Value  05/29/2018 280 (L)  05/09/2017 320 (L)  04/06/2016 260 (L)     Problem List Items Addressed This Visit      High  Human immunodeficiency virus (HIV) disease ([place])   Relevant Orders   CBC   T-helper cell (CD4)- ([facility] clinic only)   Comprehensive metabolic panel   Lipid panel   RPR   HIV-1 RNA quant-no reflex-bld        Michel Bickers, MD [facility] for [place] [phone] pager   [phone] cell 06/12/2018, 2:25 PM

## 2018-06-28 DIAGNOSIS — Z961 Presence of intraocular lens: Secondary | ICD-10-CM | POA: Diagnosis not present

## 2018-06-28 DIAGNOSIS — H353132 Nonexudative age-related macular degeneration, bilateral, intermediate dry stage: Secondary | ICD-10-CM | POA: Diagnosis not present

## 2018-06-28 DIAGNOSIS — H43393 Other vitreous opacities, bilateral: Secondary | ICD-10-CM | POA: Diagnosis not present

## 2018-06-28 DIAGNOSIS — H26493 Other secondary cataract, bilateral: Secondary | ICD-10-CM | POA: Diagnosis not present

## 2018-07-09 ENCOUNTER — Ambulatory Visit (INDEPENDENT_AMBULATORY_CARE_PROVIDER_SITE_OTHER): Payer: Federal, State, Local not specified - PPO | Admitting: Pharmacist

## 2018-07-09 DIAGNOSIS — B20 Human immunodeficiency virus [HIV] disease: Secondary | ICD-10-CM

## 2018-07-09 DIAGNOSIS — Z23 Encounter for immunization: Secondary | ICD-10-CM

## 2018-07-09 DIAGNOSIS — Z79899 Other long term (current) drug therapy: Secondary | ICD-10-CM | POA: Diagnosis not present

## 2018-07-09 DIAGNOSIS — Z881 Allergy status to other antibiotic agents status: Secondary | ICD-10-CM | POA: Diagnosis not present

## 2018-07-09 DIAGNOSIS — Z880 Allergy status to penicillin: Secondary | ICD-10-CM | POA: Diagnosis not present

## 2018-07-09 DIAGNOSIS — Z888 Allergy status to other drugs, medicaments and biological substances status: Secondary | ICD-10-CM

## 2018-07-09 NOTE — Progress Notes (Signed)
HPI: Theresa Morrison is a 62 y.o. female presenting for vaccination against hepatitis A and B.  Patient Active Problem List   Diagnosis Date Noted  . Headache 10/14/2015  . Vertigo 10/14/2015  . Diplopia 10/14/2015  . Hand pain 04/22/2015  . Hemolytic anemia ([place])   . 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 Elevated alkaline phosphatase level 01/14/2013  . Renal insufficiency 01/09/2012  . Dyslipidemia 05/11/2011  . Hip pain, left 03/08/2011  . ROTATOR CUFF SYNDROME, RIGHT 07/12/2010  . Human immunodeficiency virus (HIV) disease ([place]) 10/11/2006  . 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 RECTAL BLEEDING 10/11/2006  . SYMPTOM, ABNORMAL LOSS OF WEIGHT 10/11/2006  . 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 LIPODYSTROPHY 10/02/2006  . PERIPHERAL NEUROPATHY 10/02/2006  . History of cardiovascular disorder 10/02/2006  . ARTHROSCOPY, KNEE, HX OF 10/02/2006    Patient's Medications  New Prescriptions   No medications on file  Previous Medications   ABACAVIR-DOLUTEGRAVIR-LAMIVUDINE (TRIUMEQ) 600-50-300 MG TABLET    Take 1 tablet by mouth daily.   ACETAMINOPHEN (TYLENOL) 500 MG TABLET    Take 500 mg by mouth every 6 (six) hours as needed.   ATORVASTATIN (LIPITOR) 10 MG TABLET    Take 10 mg by mouth daily.   BETA CAROTENE W/MINERALS (OCUVITE) TABLET    Take 2 tablets by mouth daily.   CALCIUM CARBONATE (OS-CAL) 600 MG TABS    Take 1,200 mg by mouth daily.    CHOLECALCIFEROL (VITAMIN D-3) 1000 UNITS CAPS    Take 1,000 Units by mouth daily.   CLOPIDOGREL (PLAVIX) 75 MG TABLET    TAKE 1 TABLET BY MOUTH DAILY   DIAZEPAM (VALIUM) 2 MG TABLET    Take 1 tablet (2 mg total) by mouth every 6 (six) hours as needed for vertigo. May try 1/2 pill as well.   OMEGA-3 FATTY ACIDS (FISH OIL) 1000 MG CAPS    Take 1 capsule by mouth daily.    ONDANSETRON (ZOFRAN) 4 MG TABLET    Take 1 tablet (4 mg total) by mouth every 8 (eight) hours as needed for nausea or vomiting.   VITAMIN D,  ERGOCALCIFEROL, (DRISDOL) 50000 UNITS CAPS CAPSULE    Take 50,000 Units by mouth every 7 (seven) days.   ZOLPIDEM (AMBIEN) 10 MG TABLET    Take 1/2-1 tablet at bedtime as needed for trouble sleeping   ZOSTER VACCINE ADJUVANTED (SHINGRIX) INJECTION    Inject 0.5 mLs into the muscle every 8 (eight) weeks.  Modified Medications   No medications on file  Discontinued Medications   No medications on file    Allergies: Allergies  Allergen Reactions  . Penicillins     REACTION: facial swelling  . Stavudine     REACTION: peripheral neuropathy  . Sulfamethoxazole-Trimethoprim     REACTION: hives  . Sulfa Antibiotics Rash    Past Medical History: Past Medical History:  Diagnosis Date  . Anemia   . Colon polyps    adenomatous  . CVA (cerebral vascular accident) ([place]) 2000  . Headache   . HIV positive ([place]) 1987  . Hypercholesterolemia   . Hyperlipidemia   . Internal hemorrhoids   . Shingles   . Toxoplasmosis     Social History: Social History   Socioeconomic History  . Marital status: Married    Spouse name: [name]  . Number of children: 2  . Years of education: 19  . Highest education level: Not on file  Occupational History    Employer: UNEMPLOYED  Social Needs  . Financial resource strain: Not on file  . Food insecurity:    Worry: Not on file    Inability: Not on file  . Transportation needs:    Medical: Not on file    Non-medical: Not on file  Tobacco Use  . Smoking status: Never Smoker  . Smokeless tobacco: Never Used  Substance and Sexual Activity  . Alcohol use: No    Alcohol/week: 0.0 standard drinks  . Drug use: No  . Sexual activity: Not Currently    Comment: declined condoms  Lifestyle  . Physical activity:    Days per week: Not on file    Minutes per session: Not on file  . Stress: Not on file  Relationships  . Social connections:    Talks on phone: Not on file    Gets together: Not on file    Attends religious service: Not on file    Active  member of club or organization: Not on file    Attends meetings of clubs or organizations: Not on file    Relationship status: Not on file  Other Topics Concern  . Not on file  Social History Narrative   Lives with spouse and son   Caffeine use: 12oz daily    Labs: Lab Results  Component Value Date   HIV1RNAQUANT <20 NOT DETECTED 05/29/2018   HIV1RNAQUANT <20 NOT DETECTED 05/09/2017   HIV1RNAQUANT <20 04/06/2016   CD4TABS 280 (L) 05/29/2018   CD4TABS 320 (L) 05/09/2017   CD4TABS 260 (L) 04/06/2016    RPR and STI Lab Results  Component Value Date   LABRPR NON-REACTIVE 05/29/2018   LABRPR NON REAC 10/05/2015   LABRPR NON REAC 10/01/2014   LABRPR NON REAC 07/01/2013   LABRPR NON REAC 12/31/2012    No flowsheet data found.  Hepatitis B Lab Results  Component Value Date   HEPBSAB Non Reactive 06/29/2016   HEPBSAG Negative 06/29/2016   HEPBCAB Negative 06/29/2016   Hepatitis C No results found for: HEPCAB, HCVRNAPCRQN Hepatitis A Lab Results  Component Value Date   HAV NON-REACTIVE 01/10/2018   Lipids: Lab Results  Component Value Date   CHOL 126 05/29/2018   TRIG 84 05/29/2018   HDL 42 (L) 05/29/2018   CHOLHDL 3.0 05/29/2018   VLDL 25 09/15/2016   LDLCALC 68 05/29/2018    Current HIV Regimen: Triumeq  Assessment: Theresa Morrison is here today for her last hepatitis A and B vaccines. She saw Dr. Megan Salon last month for her HIV and is doing well on Triumeq. She will follow up again next year.  Plan: Hepatitis A vaccine #2/2 Hepatitis B vaccine #3/3 Next f/u appointment with Dr. Megan Salon on 07/01/19  Mila Merry. Gerarda Fraction, PharmD, [facility] PGY2 Infectious Diseases Pharmacy Resident Phone: [phone] 07/09/2018, 9:42 AM

## 2018-09-24 DIAGNOSIS — Z961 Presence of intraocular lens: Secondary | ICD-10-CM | POA: Diagnosis not present

## 2018-09-24 DIAGNOSIS — H43812 Vitreous degeneration, left eye: Secondary | ICD-10-CM | POA: Diagnosis not present

## 2018-09-24 DIAGNOSIS — H26493 Other secondary cataract, bilateral: Secondary | ICD-10-CM | POA: Diagnosis not present

## 2018-09-24 DIAGNOSIS — H353132 Nonexudative age-related macular degeneration, bilateral, intermediate dry stage: Secondary | ICD-10-CM | POA: Diagnosis not present

## 2018-10-23 DIAGNOSIS — H43812 Vitreous degeneration, left eye: Secondary | ICD-10-CM | POA: Diagnosis not present

## 2018-10-23 DIAGNOSIS — H353132 Nonexudative age-related macular degeneration, bilateral, intermediate dry stage: Secondary | ICD-10-CM | POA: Diagnosis not present

## 2018-11-04 ENCOUNTER — Other Ambulatory Visit: Payer: Self-pay | Admitting: Internal Medicine

## 2018-11-04 DIAGNOSIS — B20 Human immunodeficiency virus [HIV] disease: Secondary | ICD-10-CM

## 2018-11-08 DIAGNOSIS — B20 Human immunodeficiency virus [HIV] disease: Secondary | ICD-10-CM | POA: Diagnosis not present

## 2018-11-08 DIAGNOSIS — Z6830 Body mass index (BMI) 30.0-30.9, adult: Secondary | ICD-10-CM | POA: Diagnosis not present

## 2018-11-08 DIAGNOSIS — E441 Mild protein-calorie malnutrition: Secondary | ICD-10-CM | POA: Diagnosis not present

## 2018-11-08 DIAGNOSIS — Z0001 Encounter for general adult medical examination with abnormal findings: Secondary | ICD-10-CM | POA: Diagnosis not present

## 2018-11-08 DIAGNOSIS — Z1389 Encounter for screening for other disorder: Secondary | ICD-10-CM | POA: Diagnosis not present

## 2018-11-25 ENCOUNTER — Telehealth: Payer: Self-pay | Admitting: *Deleted

## 2018-11-25 NOTE — Telephone Encounter (Signed)
Patient called, left message in triage concerned about her labs. She states her PCP drew labs recently and her alkaline phosphatase had gone up from 161 to 194. She is worried this is connected to her HIV medications. She is not due to follow up until November for annual follow up at [facility]. Please advise. Landis Gandy, RN

## 2018-11-26 NOTE — Telephone Encounter (Signed)
Patient scheduled for appointment 5/7 11:15 with Dr Megan Salon.  She will contact her Primary care physician to send [place] lab results.

## 2018-11-26 NOTE — Telephone Encounter (Signed)
You can schedule an E visit with her.

## 2018-11-26 NOTE — Telephone Encounter (Signed)
Patient is currently at a doctor's visit, will not be home until the afternoon. Will route to the front desk for scheduling an evisit.

## 2018-11-28 ENCOUNTER — Other Ambulatory Visit: Payer: Self-pay

## 2018-11-28 ENCOUNTER — Ambulatory Visit (INDEPENDENT_AMBULATORY_CARE_PROVIDER_SITE_OTHER): Payer: Federal, State, Local not specified - PPO | Admitting: Internal Medicine

## 2018-11-28 ENCOUNTER — Encounter: Payer: Self-pay | Admitting: Internal Medicine

## 2018-11-28 DIAGNOSIS — B20 Human immunodeficiency virus [HIV] disease: Secondary | ICD-10-CM

## 2018-11-28 NOTE — Progress Notes (Signed)
Virtual Visit via Telephone Note  I connected with Theresa Morrison on 11/28/18 at 11:15 AM EDT by telephone and verified that I am speaking with the correct person using two identifiers.  Location: Patient: Home Provider: [facility]   I discussed the limitations, risks, security and privacy concerns of performing an evaluation and management service by telephone and the availability of in person appointments. I also discussed with the patient that there may be a patient responsible charge related to this service. The patient expressed understanding and agreed to proceed.   History of Present Illness: Called and spoke with Theresa Morrison by phone today.  She has had no problems obtaining, taking or tolerating her Triumeq and denies missing doses.  She is feeling well other than finding that it is a little stressful having her children at home during the [place] pandemic.  She was recently restarted on vitamin D and calcium by Dr. Hilma Favors, her PCP.   Observations/Objective: CMP     Component Value Date/Time   NA 145 05/29/2018 0931   K 3.7 05/29/2018 0931   CL 110 05/29/2018 0931   CO2 27 05/29/2018 0931   GLUCOSE 82 05/29/2018 0931   BUN 8 05/29/2018 0931   CREATININE 1.05 (H) 05/29/2018 0931   CALCIUM 9.0 05/29/2018 0931   PROT 5.8 (L) 05/29/2018 0931   ALBUMIN 4.0 09/04/2017 1034   AST 17 05/29/2018 0931   ALT 17 05/29/2018 0931   ALKPHOS 144 (H) 09/04/2017 1034   BILITOT 0.5 05/29/2018 0931   GFRNONAA 51 (L) 09/04/2017 1034   GFRNONAA 51 (L) 05/09/2017 1157   GFRAA 59 (L) 09/04/2017 1034   GFRAA 60 05/09/2017 1157   Lab Results  Component Value Date   WBC 3.2 (L) 05/29/2018   HGB 13.5 05/29/2018   HCT 38.9 05/29/2018   MCV 100.0 05/29/2018   PLT 158 05/29/2018   HIV 1 RNA Quant (copies/mL)  Date Value  05/29/2018 <20 NOT DETECTED  05/09/2017 <20 NOT DETECTED  04/06/2016 <20   CD4 T Cell Abs (/uL)  Date Value  05/29/2018 280 (L)   05/09/2017 320 (L)  04/06/2016 260 (L)   Assessment and Plan: Her HIV infection is under excellent, long-term control.  She will continue Triumeq and follow-up after lab work in 6 months.  She has chronic elevation of her alkaline phosphatase.  She was evaluated in 2014 by Dr. Scarlette Shorts and it was found to be of bone origin.  She is vitamin D deficient and was appropriately started back on vitamin D and calcium recently.  Follow Up Instructions: Continue Triumeq and follow-up after lab work in 6 months   I discussed the assessment and treatment plan with the patient. The patient was provided an opportunity to ask questions and all were answered. The patient agreed with the plan and demonstrated an understanding of the instructions.   The patient was advised to call back or seek an in-person evaluation if the symptoms worsen or if the condition fails to improve as anticipated.  I provided 13 minutes of non-face-to-face time during this encounter.   Michel Bickers, MD

## 2019-01-06 DIAGNOSIS — Z6830 Body mass index (BMI) 30.0-30.9, adult: Secondary | ICD-10-CM | POA: Diagnosis not present

## 2019-01-06 DIAGNOSIS — E6609 Other obesity due to excess calories: Secondary | ICD-10-CM | POA: Diagnosis not present

## 2019-01-06 DIAGNOSIS — M722 Plantar fascial fibromatosis: Secondary | ICD-10-CM | POA: Diagnosis not present

## 2019-01-06 DIAGNOSIS — Z1389 Encounter for screening for other disorder: Secondary | ICD-10-CM | POA: Diagnosis not present

## 2019-02-06 DIAGNOSIS — H43812 Vitreous degeneration, left eye: Secondary | ICD-10-CM | POA: Diagnosis not present

## 2019-02-06 DIAGNOSIS — H43393 Other vitreous opacities, bilateral: Secondary | ICD-10-CM | POA: Diagnosis not present

## 2019-02-06 DIAGNOSIS — H353132 Nonexudative age-related macular degeneration, bilateral, intermediate dry stage: Secondary | ICD-10-CM | POA: Diagnosis not present

## 2019-02-06 DIAGNOSIS — Z961 Presence of intraocular lens: Secondary | ICD-10-CM | POA: Diagnosis not present

## 2019-04-17 ENCOUNTER — Encounter: Payer: Self-pay | Admitting: Internal Medicine

## 2019-04-30 ENCOUNTER — Telehealth: Payer: Self-pay | Admitting: *Deleted

## 2019-04-30 NOTE — Telephone Encounter (Signed)
Pt. On plavix needs a office visit.

## 2019-05-07 NOTE — Telephone Encounter (Signed)
In f/u seen pt. Scheduled for OV 06/11/19 @ 2:20 PM.

## 2019-05-09 DIAGNOSIS — H43393 Other vitreous opacities, bilateral: Secondary | ICD-10-CM | POA: Diagnosis not present

## 2019-05-09 DIAGNOSIS — H353132 Nonexudative age-related macular degeneration, bilateral, intermediate dry stage: Secondary | ICD-10-CM | POA: Diagnosis not present

## 2019-05-09 DIAGNOSIS — H2640 Unspecified secondary cataract: Secondary | ICD-10-CM | POA: Diagnosis not present

## 2019-05-09 DIAGNOSIS — H43812 Vitreous degeneration, left eye: Secondary | ICD-10-CM | POA: Diagnosis not present

## 2019-05-26 ENCOUNTER — Encounter: Payer: Federal, State, Local not specified - PPO | Admitting: Internal Medicine

## 2019-05-30 DIAGNOSIS — H43811 Vitreous degeneration, right eye: Secondary | ICD-10-CM | POA: Diagnosis not present

## 2019-05-30 DIAGNOSIS — H43391 Other vitreous opacities, right eye: Secondary | ICD-10-CM | POA: Diagnosis not present

## 2019-06-11 ENCOUNTER — Encounter: Payer: Self-pay | Admitting: Internal Medicine

## 2019-06-11 ENCOUNTER — Other Ambulatory Visit: Payer: Self-pay

## 2019-06-11 ENCOUNTER — Ambulatory Visit (INDEPENDENT_AMBULATORY_CARE_PROVIDER_SITE_OTHER): Payer: Federal, State, Local not specified - PPO | Admitting: Internal Medicine

## 2019-06-11 VITALS — BP 96/56 | HR 68 | Temp 97.9°F | Ht 64.0 in | Wt 171.0 lb

## 2019-06-11 DIAGNOSIS — Z1159 Encounter for screening for other viral diseases: Secondary | ICD-10-CM | POA: Diagnosis not present

## 2019-06-11 DIAGNOSIS — K5901 Slow transit constipation: Secondary | ICD-10-CM | POA: Diagnosis not present

## 2019-06-11 DIAGNOSIS — Z86010 Personal history of colonic polyps: Secondary | ICD-10-CM | POA: Diagnosis not present

## 2019-06-11 DIAGNOSIS — Z7902 Long term (current) use of antithrombotics/antiplatelets: Secondary | ICD-10-CM | POA: Diagnosis not present

## 2019-06-11 MED ORDER — NA SULFATE-K SULFATE-MG SULF 17.5-3.13-1.6 GM/177ML PO SOLN: 1.0000 | Freq: Once | ORAL | 0 refills | Status: AC

## 2019-06-11 NOTE — Progress Notes (Signed)
HISTORY OF PRESENT ILLNESS:  Theresa Morrison is a 63 y.o. female with HIV disease (under treatment), lipodystrophy, peripheral neuropathy, and prior CVA for which she is maintained on chronic Plavix therapy.  Patient was last seen in the office January 2017 regarding colonoscopy.  See that dictation.  She subsequently underwent complete colonoscopy August 19, 2015.  She was maintained ON PLAVIX.  She was found to have 4 subcentimeter tubular adenomas.  The examination was otherwise normal.  Follow-up in 3 years recommended.  Patient tells me that she has intermittent constipation.  However, typically has a bowel movement per day.  She has tried no agents to assist with this issue.  Review of outside blood work from April 2020 shows normal hemoglobin of 13.9.  Alkaline phosphatase 194 (previously found to be nonliver origin).  Previous abdominal ultrasound with unremarkable liver.  REVIEW OF SYSTEMS:  All non-GI ROS negative unless otherwise stated in the HPI except for headaches  Past Medical History:  Diagnosis Date  . Anemia   . Colon polyps    adenomatous  . CVA (cerebral vascular accident) (Theresa Morrison) 2000  . Headache   . HIV positive (Theresa Morrison) 1987  . Hypercholesterolemia   . Hyperlipidemia   . Internal hemorrhoids   . Shingles   . Toxoplasmosis     Past Surgical History:  Procedure Laterality Date  . APPENDECTOMY  2001  . KNEE ARTHROSCOPY Right    x 2  . SHOULDER SURGERY Right   . TONSILLECTOMY    . ULNAR NERVE TRANSPOSITION Left 03/20/2017   Procedure: LEFT ELBOW ULNAR NERVE DECOMPRESSION;  Surgeon: Theresa Brod, MD;  Location: [facility];  Service: Orthopedics;  Laterality: Left;    Social History [name]  reports that she has never smoked. She has never used smokeless tobacco. She reports that she does not drink alcohol or use drugs.  family history includes Breast cancer in her mother; Heart attack in her father; High Cholesterol in her father;  Prostate cancer in her brother and brother.  Allergies  Allergen Reactions  . Penicillins     REACTION: facial swelling  . Stavudine     REACTION: peripheral neuropathy  . Sulfamethoxazole-Trimethoprim     REACTION: hives  . Sulfa Antibiotics Rash       PHYSICAL EXAMINATION: Vital signs: BP (!) 96/56   Pulse 68   Temp 97.9 F (36.6 C)   Ht 5\' 4"  (1.626 m)   Wt 171 lb (77.6 kg)   BMI 29.35 kg/m   Constitutional: generally well-appearing, no acute distress Psychiatric: alert and oriented x3, cooperative Eyes: extraocular movements intact, anicteric, conjunctiva pink Mouth: oral pharynx moist, no lesions Neck: supple no lymphadenopathy Cardiovascular: heart regular rate and rhythm, no murmur Lungs: clear to auscultation bilaterally Abdomen: soft, nontender, nondistended, no obvious ascites, no peritoneal signs, normal bowel sounds, no organomegaly Rectal: Deferred until colonoscopy Extremities: no clubbing, cyanosis, or lower extremity edema bilaterally Skin: no lesions on visible extremities Neuro: No focal deficits.  Cranial nerves intact  ASSESSMENT:  1.  History of multiple adenomatous colon polyps due for surveillance. 2.  Multiple medical problems including prior history of CVA on Plavix 3.  HIV disease, on therapy. 4.  Mild constipation   PLAN:  1.  Increase water consumption and add daily stool softener for mild constipation 2.  Schedule colonoscopy.  The patient is HIGH RISK given her comorbidities and the need for Plavix therapy with prior history of stroke.  As previous, we will keep  the patient ON PLAVIX.  We discussed the pros and cons of this approach as well as the risks and benefits of this approach.The nature of the procedure, as well as the risks, benefits, and alternatives were carefully and thoroughly reviewed with the patient. Ample time for discussion and questions allowed. The patient understood, was satisfied, and agreed to proceed.

## 2019-06-11 NOTE — Patient Instructions (Signed)
You have been scheduled for a colonoscopy. Please follow written instructions given to you at your visit today.  Please pick up your prep supplies at the pharmacy within the next 1-3 days. If you use inhalers (even only as needed), please bring them with you on the day of your procedure.   

## 2019-06-12 ENCOUNTER — Telehealth: Payer: Self-pay

## 2019-06-12 NOTE — Telephone Encounter (Signed)
COVID-19 Pre-Screening Questions:06/12/19   Do you currently have a fever (>100 F), chills or unexplained body aches? NO   Are you currently experiencing new cough, shortness of breath, sore throat, runny nose? NO  .  Have you recently travelled outside the state of [place] in the last 14 days? N O .  Have you been in contact with someone that is currently pending confirmation of Covid19 testing or has been confirmed to have the Covid19 virus?  NO  **If the patient answers NO to ALL questions -  advise the patient to please call the clinic before coming to the office should any symptoms develop.     

## 2019-06-13 ENCOUNTER — Other Ambulatory Visit: Payer: Self-pay

## 2019-06-13 ENCOUNTER — Other Ambulatory Visit: Payer: Federal, State, Local not specified - PPO

## 2019-06-13 DIAGNOSIS — B20 Human immunodeficiency virus [HIV] disease: Secondary | ICD-10-CM

## 2019-06-13 DIAGNOSIS — Z79899 Other long term (current) drug therapy: Secondary | ICD-10-CM | POA: Diagnosis not present

## 2019-06-13 DIAGNOSIS — E785 Hyperlipidemia, unspecified: Secondary | ICD-10-CM | POA: Diagnosis not present

## 2019-06-13 LAB — T-HELPER CELL (CD4) - (RCID CLINIC ONLY)
CD4 % Helper T Cell: 53 % (ref 33–65)
CD4 T Cell Abs: 281 /uL — ABNORMAL LOW (ref 400–1790)

## 2019-06-20 LAB — CBC
HCT: 40 % (ref 35.0–45.0)
Hemoglobin: 13.7 g/dL (ref 11.7–15.5)
MCH: 34.9 pg — ABNORMAL HIGH (ref 27.0–33.0)
MCHC: 34.3 g/dL (ref 32.0–36.0)
MCV: 101.8 fL — ABNORMAL HIGH (ref 80.0–100.0)
MPV: 9.8 fL (ref 7.5–12.5)
Platelets: 149 10*3/uL (ref 140–400)
RBC: 3.93 10*6/uL (ref 3.80–5.10)
RDW: 11.4 % (ref 11.0–15.0)
WBC: 3 10*3/uL — ABNORMAL LOW (ref 3.8–10.8)

## 2019-06-20 LAB — LIPID PANEL
Cholesterol: 150 mg/dL (ref <200)
HDL: 47 mg/dL — ABNORMAL LOW (ref >=50)
LDL Cholesterol (Calc): 86 mg/dL (calc)
Non-HDL Cholesterol (Calc): 103 mg/dL (calc) (ref <130)
Total CHOL/HDL Ratio: 3.2 (calc) (ref <5.0)
Triglycerides: 84 mg/dL (ref <150)

## 2019-06-20 LAB — COMPREHENSIVE METABOLIC PANEL
AG Ratio: 2.5 (calc) (ref 1.0–2.5)
ALT: 11 U/L (ref 6–29)
AST: 15 U/L (ref 10–35)
Albumin: 4.2 g/dL (ref 3.6–5.1)
Alkaline phosphatase (APISO): 137 U/L (ref 37–153)
BUN/Creatinine Ratio: 9 (calc) (ref 6–22)
BUN: 12 mg/dL (ref 7–25)
CO2: 27 mmol/L (ref 20–32)
Calcium: 9.3 mg/dL (ref 8.6–10.4)
Chloride: 108 mmol/L (ref 98–110)
Creat: 1.41 mg/dL — ABNORMAL HIGH (ref 0.50–0.99)
Globulin: 1.7 g/dL (calc) — ABNORMAL LOW (ref 1.9–3.7)
Glucose, Bld: 82 mg/dL (ref 65–99)
Potassium: 3.8 mmol/L (ref 3.5–5.3)
Sodium: 143 mmol/L (ref 135–146)
Total Bilirubin: 0.4 mg/dL (ref 0.2–1.2)
Total Protein: 5.9 g/dL — ABNORMAL LOW (ref 6.1–8.1)

## 2019-06-20 LAB — HIV-1 RNA QUANT-NO REFLEX-BLD
HIV 1 RNA Quant: <20 copies/mL
HIV-1 RNA Quant, Log: <1.3 Log copies/mL

## 2019-06-20 LAB — RPR: RPR Ser Ql: NONREACTIVE

## 2019-06-26 ENCOUNTER — Ambulatory Visit (INDEPENDENT_AMBULATORY_CARE_PROVIDER_SITE_OTHER): Payer: Federal, State, Local not specified - PPO

## 2019-06-26 ENCOUNTER — Other Ambulatory Visit: Payer: Self-pay | Admitting: Internal Medicine

## 2019-06-26 DIAGNOSIS — Z1159 Encounter for screening for other viral diseases: Secondary | ICD-10-CM

## 2019-06-27 LAB — SARS CORONAVIRUS 2 (TAT 6-24 HRS): SARS Coronavirus 2: NEGATIVE

## 2019-06-30 ENCOUNTER — Encounter: Payer: Self-pay | Admitting: Internal Medicine

## 2019-06-30 ENCOUNTER — Other Ambulatory Visit: Payer: Self-pay

## 2019-06-30 ENCOUNTER — Ambulatory Visit (AMBULATORY_SURGERY_CENTER): Payer: Federal, State, Local not specified - PPO | Admitting: Internal Medicine

## 2019-06-30 VITALS — BP 104/58 | HR 58 | Temp 97.7°F | Resp 20 | Ht 64.0 in | Wt 171.0 lb

## 2019-06-30 DIAGNOSIS — D120 Benign neoplasm of cecum: Secondary | ICD-10-CM

## 2019-06-30 DIAGNOSIS — Z860100 Personal history of colon polyps, unspecified: Secondary | ICD-10-CM

## 2019-06-30 DIAGNOSIS — D122 Benign neoplasm of ascending colon: Secondary | ICD-10-CM | POA: Diagnosis not present

## 2019-06-30 DIAGNOSIS — Z1211 Encounter for screening for malignant neoplasm of colon: Secondary | ICD-10-CM | POA: Diagnosis not present

## 2019-06-30 MED ORDER — SODIUM CHLORIDE 0.9 % IV SOLN: 500.0000 mL | Freq: Once | INTRAVENOUS | Status: DC

## 2019-06-30 NOTE — Op Note (Signed)
[facility] Patient Name: Jadis Horrell Procedure Date: 06/30/2019 10:17 AM MRN: [id number] Endoscopist: Docia Chuck. Henrene Pastor , MD Age: 63 Referring MD:  Date of Birth: Aug 07, 1955 Gender: Female Account #: [id number] Procedure:                Colonoscopy with cold snare polypectomy x 4 Indications:              High risk colon cancer surveillance: Personal                            history of multiple (3 or more) adenomas. Previous                            examination January 2017 Medicines:                Monitored Anesthesia Care Procedure:                Pre-Anesthesia Assessment:                           - Prior to the procedure, a History and Physical                            was performed, and patient medications and                            allergies were reviewed. The patient's tolerance of                            previous anesthesia was also reviewed. The risks                            and benefits of the procedure and the sedation                            options and risks were discussed with the patient.                            All questions were answered, and informed consent                            was obtained. Prior Anticoagulants: The patient has                            taken Plavix. Last dose yesterday. ASA Grade                            Assessment: III - A patient with mild systemic                            disease. After reviewing the risks and benefits,                            the patient was deemed in satisfactory condition to  undergo the procedure.                           After obtaining informed consent, the colonoscope                            was passed under direct vision. Throughout the                            procedure, the patient's blood pressure, pulse, and                            oxygen saturations were monitored continuously. The                            Colonoscope was  introduced through the anus and                            advanced to the the cecum, identified by                            appendiceal orifice and ileocecal valve. The                            ileocecal valve, appendiceal orifice, and rectum                            were photographed. The quality of the bowel                            preparation was good. The colonoscopy was performed                            without difficulty. The patient tolerated the                            procedure well. The bowel preparation used was                            SUPREP via split dose instruction. Scope In: 10:34:07 AM Scope Out: 10:53:47 AM Scope Withdrawal Time: 0 hours 16 minutes 21 seconds  Total Procedure Duration: 0 hours 19 minutes 40 seconds  Findings:                 Four polyps were found in the ascending colon and                            cecum. The polyps were 1 to 2 mm in size. These                            polyps were removed with a cold snare. Resection                            and retrieval were complete.  Internal hemorrhoids were found during retroflexion.                           The exam was otherwise without abnormality on                            direct and retroflexion views. Complications:            No immediate complications. Estimated blood loss:                            None. Estimated Blood Loss:     Estimated blood loss: none. Impression:               - Four 1 to 2 mm polyps in the ascending colon and                            in the cecum, removed with a cold snare. Resected                            and retrieved.                           - Internal hemorrhoids.                           - The examination was otherwise normal on direct                            and retroflexion views. Recommendation:           - Repeat colonoscopy in 3 - 5 years for                            surveillance.                            - Patient has a contact number available for                            emergencies. The signs and symptoms of potential                            delayed complications were discussed with the                            patient. Return to normal activities tomorrow.                            Written discharge instructions were provided to the                            patient.                           - Resume previous diet.                           -  Continue present medications. Resume Plavix today                           - Await pathology results. Docia Chuck. Henrene Pastor, MD 06/30/2019 11:00:31 AM This report has been signed electronically.

## 2019-06-30 NOTE — Progress Notes (Signed)
Report to PACU, RN, vss, BBS= Clear.  

## 2019-06-30 NOTE — Patient Instructions (Signed)
Thank you for allowing [place] to care for you today!  Await pathology results of polyps.  We will send a letter in 7-10 days.  Will recommend next colonoscopy at that time.  Resume previous diet and medications today, including Plavix.  Return to your normal activities tomorrow.,     YOU HAD AN ENDOSCOPIC PROCEDURE TODAY AT [facility]:   Refer to the procedure report that was given to you for any specific questions about what was found during the examination.  If the procedure report does not answer your questions, please call your gastroenterologist to clarify.  If you requested that your care partner not be given the details of your procedure findings, then the procedure report has been included in a sealed envelope for you to review at your convenience later.  YOU SHOULD EXPECT: Some feelings of bloating in the abdomen. Passage of more gas than usual.  Walking can help get rid of the air that was put into your GI tract during the procedure and reduce the bloating. If you had a lower endoscopy (such as a colonoscopy or flexible sigmoidoscopy) you may notice spotting of blood in your stool or on the toilet paper. If you underwent a bowel prep for your procedure, you may not have a normal bowel movement for a few days.  Please Note:  You might notice some irritation and congestion in your nose or some drainage.  This is from the oxygen used during your procedure.  There is no need for concern and it should clear up in a day or so.  SYMPTOMS TO REPORT IMMEDIATELY:   Following lower endoscopy (colonoscopy or flexible sigmoidoscopy):  Excessive amounts of blood in the stool  Significant tenderness or worsening of abdominal pains  Swelling of the abdomen that is new, acute  Fever of 100F or higher   Following upper endoscopy (EGD)  Vomiting of blood or coffee ground material  New chest pain or pain under the shoulder blades  Painful or persistently difficult swallowing  New  shortness of breath  Fever of 100F or higher  Black, tarry-looking stools  For urgent or emergent issues, a gastroenterologist can be reached at any hour by calling [phone].   DIET:  We do recommend a small meal at first, but then you may proceed to your regular diet.  Drink plenty of fluids but you should avoid alcoholic beverages for 24 hours.  ACTIVITY:  You should plan to take it easy for the rest of today and you should NOT DRIVE or use heavy machinery until tomorrow (because of the sedation medicines used during the test).    FOLLOW UP: Our staff will call the number listed on your records 48-72 hours following your procedure to check on you and address any questions or concerns that you may have regarding the information given to you following your procedure. If we do not reach you, we will leave a message.  We will attempt to reach you two times.  During this call, we will ask if you have developed any symptoms of COVID 19. If you develop any symptoms (ie: fever, flu-like symptoms, shortness of breath, cough etc.) before then, please call [phone].  If you test positive for Covid 19 in the 2 weeks post procedure, please call and report this information to [place].    If any biopsies were taken you will be contacted by phone or by letter within the next 1-3 weeks.  Please call us at [phone] if  you have not heard about the biopsies in 3 weeks.    SIGNATURES/CONFIDENTIALITY: You and/or your care partner have signed paperwork which will be entered into your electronic medical record.  These signatures attest to the fact that that the information above on your After Visit Summary has been reviewed and is understood.  Full responsibility of the confidentiality of this discharge information lies with you and/or your care-partner.

## 2019-06-30 NOTE — Progress Notes (Signed)
Pt's states no medical or surgical changes since previsit or office visit.  CW vitals, LC temp and SS IV.

## 2019-07-01 ENCOUNTER — Ambulatory Visit (INDEPENDENT_AMBULATORY_CARE_PROVIDER_SITE_OTHER): Payer: Federal, State, Local not specified - PPO | Admitting: Internal Medicine

## 2019-07-01 VITALS — Wt 168.0 lb

## 2019-07-01 DIAGNOSIS — F331 Major depressive disorder, recurrent, moderate: Secondary | ICD-10-CM

## 2019-07-01 DIAGNOSIS — Z23 Encounter for immunization: Secondary | ICD-10-CM | POA: Diagnosis not present

## 2019-07-01 DIAGNOSIS — N289 Disorder of kidney and ureter, unspecified: Secondary | ICD-10-CM

## 2019-07-01 DIAGNOSIS — B20 Human immunodeficiency virus [HIV] disease: Secondary | ICD-10-CM | POA: Diagnosis not present

## 2019-07-01 DIAGNOSIS — F32A Depression, unspecified: Secondary | ICD-10-CM | POA: Insufficient documentation

## 2019-07-01 DIAGNOSIS — F329 Major depressive disorder, single episode, unspecified: Secondary | ICD-10-CM | POA: Insufficient documentation

## 2019-07-01 NOTE — Assessment & Plan Note (Signed)
She is struggling with depression due to the Covid pandemic and family strife and health issues.  I have encouraged her to try to get regular exercise and have given her contact information for our behavioral health counselors.

## 2019-07-01 NOTE — Progress Notes (Signed)
Patient Active Problem List   Diagnosis Date Noted  . Hemolytic anemia (HCC)     Priority: High  . Renal insufficiency 01/09/2012    Priority: High  . Dyslipidemia 05/11/2011    Priority: High  . Human immunodeficiency virus (HIV) disease ([place]) 10/11/2006    Priority: High  . History of cardiovascular disorder 10/02/2006    Priority: High  . Depression 07/01/2019  . Headache 10/14/2015  . Vertigo 10/14/2015  . Diplopia 10/14/2015  . Hand pain 04/22/2015  . 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 Elevated alkaline phosphatase level 01/14/2013  . Hip pain, left 03/08/2011  . ROTATOR CUFF SYNDROME, RIGHT 07/12/2010  . 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 RECTAL BLEEDING 10/11/2006  . SYMPTOM, ABNORMAL LOSS OF WEIGHT 10/11/2006  . 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 LIPODYSTROPHY 10/02/2006  . PERIPHERAL NEUROPATHY 10/02/2006  . ARTHROSCOPY, KNEE, HX OF 10/02/2006    Patient's Medications  New Prescriptions   No medications on file  Previous Medications   ATORVASTATIN (LIPITOR) 10 MG TABLET    Take 10 mg by mouth daily.   BETA CAROTENE W/MINERALS (OCUVITE) TABLET    Take 2 tablets by mouth daily.   CALCIUM CARBONATE (OS-CAL) 600 MG TABS    Take 1,200 mg by mouth daily.    CHOLECALCIFEROL (VITAMIN D-3) 1000 UNITS CAPS    Take 1,000 Units by mouth daily.   CLOPIDOGREL (PLAVIX) 75 MG TABLET    TAKE 1 TABLET BY MOUTH DAILY   OMEGA-3 FATTY ACIDS (FISH OIL) 1000 MG CAPS    Take 1 capsule by mouth daily.    TRIUMEQ 600-50-300 MG TABLET    TAKE 1 TABLET DAILY.   ZOLPIDEM (AMBIEN) 10 MG TABLET    Take 1/2-1 tablet at bedtime as needed for trouble sleeping  Modified Medications   No medications on file  Discontinued Medications   No medications on file    Subjective: Theresa Morrison is in for her routine HIV follow-up visit.  She has not had any problems obtaining, taking or tolerating her Triumeq.  She denies missing any doses.  She is not on any new  medications.  She underwent a colonoscopy yesterday and had 4 small polyps removed.  Her husband has been working from home during the [employer] pandemic.  They are taking care of their granddaughter every other weekend.  She has been struggling with more depression.  She feels that she is estranged from her daughter and son.  She says that she lost both parents when they were in their 53s and she just learned that her oldest brother has stage IV prostate cancer.  She is not getting any regular exercise.  Review of Systems: Review of Systems  Constitutional: Positive for malaise/fatigue. Negative for chills, diaphoresis, fever and weight loss.  HENT: Negative for sore throat.   Respiratory: Negative for cough, sputum production and shortness of breath.   Cardiovascular: Negative for chest pain.  Gastrointestinal: Negative for abdominal pain, diarrhea, heartburn, nausea and vomiting.  Genitourinary: Negative for dysuria and frequency.  Musculoskeletal: Negative for joint pain and myalgias.  Skin: Negative for rash.  Neurological: Negative for dizziness and headaches.  Psychiatric/Behavioral: Positive for depression. Negative for substance abuse. The patient is not nervous/anxious.     Past Medical History:  Diagnosis Date  . Anemia   . Colon polyps    adenomatous  . CVA (cerebral vascular accident) ([facility]) 2000  . Headache   .  HIV positive ([facility]) 1987  . Hypercholesterolemia   . Hyperlipidemia   . Internal hemorrhoids   . Shingles   . Toxoplasmosis     Social History   Tobacco Use  . Smoking status: Never Smoker  . Smokeless tobacco: Never Used  Substance Use Topics  . Alcohol use: No    Alcohol/week: 0.0 standard drinks  . Drug use: No    Family History  Problem Relation Age of Onset  . Heart attack Father   . High Cholesterol Father   . Breast cancer Mother        mets to lung and brain  . Prostate cancer Brother   . Prostate cancer Brother   . Stroke Neg Hx   . Migraines  Neg Hx   . Neuropathy Neg Hx   . Colon cancer Neg Hx   . Esophageal cancer Neg Hx   . Pancreatic cancer Neg Hx   . Stomach cancer Neg Hx   . Liver disease Neg Hx     Allergies  Allergen Reactions  . Penicillins     REACTION: facial swelling  . Stavudine     REACTION: peripheral neuropathy  . Sulfamethoxazole-Trimethoprim     REACTION: hives  . Sulfa Antibiotics Rash    Health Maintenance  Topic Date Due  . MAMMOGRAM  02/11/2016  . PAP SMEAR-Modifier  02/12/2016  . INFLUENZA VACCINE  02/22/2019  . COLONOSCOPY  06/29/2022  . TETANUS/TDAP  03/28/2028  . Hepatitis C Screening  Completed  . HIV Screening  Completed    Objective:  Vitals:   07/01/19 1131  Weight: 168 lb (76.2 kg)   Body mass index is 28.84 kg/m.  Physical Exam Constitutional:      Comments: Her weight is unchanged.  Cardiovascular:     Rate and Rhythm: Normal rate and regular rhythm.     Heart sounds: No murmur.  Pulmonary:     Effort: Pulmonary effort is normal.     Breath sounds: Normal breath sounds.  Abdominal:     Palpations: Abdomen is soft.     Tenderness: There is no abdominal tenderness.  Musculoskeletal:        General: No swelling or tenderness.  Skin:    Findings: No rash.  Neurological:     General: No focal deficit present.  Psychiatric:        Mood and Affect: Mood normal.     Lab Results Lab Results  Component Value Date   WBC 3.0 (L) 06/13/2019   HGB 13.7 06/13/2019   HCT 40.0 06/13/2019   MCV 101.8 (H) 06/13/2019   PLT 149 06/13/2019    Lab Results  Component Value Date   CREATININE 1.41 (H) 06/13/2019   BUN 12 06/13/2019   NA 143 06/13/2019   K 3.8 06/13/2019   CL 108 06/13/2019   CO2 27 06/13/2019    Lab Results  Component Value Date   ALT 11 06/13/2019   AST 15 06/13/2019   ALKPHOS 144 (H) 09/04/2017   BILITOT 0.4 06/13/2019    Lab Results  Component Value Date   CHOL 150 06/13/2019   HDL 47 (L) 06/13/2019   LDLCALC 86 06/13/2019   TRIG 84  06/13/2019   CHOLHDL 3.2 06/13/2019   Lab Results  Component Value Date   LABRPR NON-REACTIVE 06/13/2019   HIV 1 RNA Quant (copies/mL)  Date Value  06/13/2019 <20 NOT DETECTED  05/29/2018 <20 NOT DETECTED  05/09/2017 <20 NOT DETECTED   CD4 T Cell Abs (/uL)  Date Value  06/13/2019 281 (L)  05/29/2018 280 (L)  05/09/2017 320 (L)     Problem List Items Addressed This Visit      High   Renal insufficiency    Her creatinine is up further.  She is not hypertensive.  Renal insufficiency could be HIV related.  Triumeq has no adverse impact on renal function.      Human immunodeficiency virus (HIV) disease ([place])    Her infection remains under excellent, long-term control.  She received her influenza and Prevnar vaccines today.  She will continue Triumeq and follow-up after lab work in 1 year.      Relevant Orders   1 Year CBC   1 Year CD4   1 Year CMP   1 Year Lipid panel   1 Year RPR   1 Year VL     Unprioritized   Depression    She is struggling with depression due to the Covid pandemic and family strife and health issues.  I have encouraged her to try to get regular exercise and have given her contact information for our behavioral health counselors.           Michel Bickers, MD [facility] [phone] pager   [phone] cell 07/01/2019, 11:49 AM

## 2019-07-01 NOTE — Assessment & Plan Note (Signed)
Her creatinine is up further.  She is not hypertensive.  Renal insufficiency could be HIV related.  Triumeq has no adverse impact on renal function.

## 2019-07-01 NOTE — Assessment & Plan Note (Signed)
Her infection remains under excellent, long-term control.  She received her influenza and Prevnar vaccines today.  She will continue Triumeq and follow-up after lab work in 1 year.

## 2019-07-02 ENCOUNTER — Telehealth: Payer: Self-pay | Admitting: *Deleted

## 2019-07-02 NOTE — Telephone Encounter (Signed)
  Follow up Call-  Call back number 06/30/2019  Post procedure Call Back phone  # [phone]  Permission to leave phone message Yes  Some recent data might be hidden     Patient questions:  Do you have a fever, pain , or abdominal swelling? No. Pain Score  0 *  Have you tolerated food without any problems? Yes.    Have you been able to return to your normal activities? Yes.    Do you have any questions about your discharge instructions: Diet   No. Medications  No. Follow up visit  No.  Do you have questions or concerns about your Care? No.  Actions: * If pain score is 4 or above: No action needed, pain <4.  1. Have you developed a fever since your procedure? no  2.   Have you had an respiratory symptoms (SOB or cough) since your procedure? no  3.   Have you tested positive for COVID 19 since your procedure no  4.   Have you had any family members/close contacts diagnosed with the COVID 19 since your procedure?  no   If yes to any of these questions please route to Joylene John, RN and Alphonsa Gin, Therapist, sports.

## 2019-07-03 ENCOUNTER — Encounter: Payer: Self-pay | Admitting: Internal Medicine

## 2019-07-09 DIAGNOSIS — H43393 Other vitreous opacities, bilateral: Secondary | ICD-10-CM | POA: Diagnosis not present

## 2019-07-09 DIAGNOSIS — H43813 Vitreous degeneration, bilateral: Secondary | ICD-10-CM | POA: Diagnosis not present

## 2019-07-17 DIAGNOSIS — N3001 Acute cystitis with hematuria: Secondary | ICD-10-CM | POA: Diagnosis not present

## 2019-07-21 DIAGNOSIS — Z6829 Body mass index (BMI) 29.0-29.9, adult: Secondary | ICD-10-CM | POA: Diagnosis not present

## 2019-07-21 DIAGNOSIS — Z1231 Encounter for screening mammogram for malignant neoplasm of breast: Secondary | ICD-10-CM | POA: Diagnosis not present

## 2019-07-21 DIAGNOSIS — Z01419 Encounter for gynecological examination (general) (routine) without abnormal findings: Secondary | ICD-10-CM | POA: Diagnosis not present

## 2019-07-22 ENCOUNTER — Other Ambulatory Visit: Payer: Self-pay | Admitting: Obstetrics and Gynecology

## 2019-07-22 DIAGNOSIS — R928 Other abnormal and inconclusive findings on diagnostic imaging of breast: Secondary | ICD-10-CM

## 2019-07-24 NOTE — Addendum Note (Signed)
Addended by: Landis Gandy on: 07/24/2019 01:28 PM   Modules accepted: Orders

## 2019-07-28 ENCOUNTER — Other Ambulatory Visit: Payer: Self-pay | Admitting: Obstetrics and Gynecology

## 2019-07-28 ENCOUNTER — Ambulatory Visit: Payer: Federal, State, Local not specified - PPO

## 2019-07-28 ENCOUNTER — Ambulatory Visit
Admission: RE | Admit: 2019-07-28 | Discharge: 2019-07-28 | Disposition: A | Discharge status: Home / Self Care | Payer: Federal, State, Local not specified - PPO | Source: Ambulatory Visit | Attending: Obstetrics and Gynecology | Admitting: Obstetrics and Gynecology

## 2019-07-28 ENCOUNTER — Other Ambulatory Visit: Payer: Self-pay

## 2019-07-28 DIAGNOSIS — R922 Inconclusive mammogram: Secondary | ICD-10-CM | POA: Diagnosis not present

## 2019-07-28 DIAGNOSIS — R928 Other abnormal and inconclusive findings on diagnostic imaging of breast: Secondary | ICD-10-CM

## 2019-07-28 DIAGNOSIS — R921 Mammographic calcification found on diagnostic imaging of breast: Secondary | ICD-10-CM

## 2019-10-31 ENCOUNTER — Other Ambulatory Visit: Payer: Self-pay | Admitting: Internal Medicine

## 2019-10-31 DIAGNOSIS — B20 Human immunodeficiency virus [HIV] disease: Secondary | ICD-10-CM

## 2019-11-11 DIAGNOSIS — H43813 Vitreous degeneration, bilateral: Secondary | ICD-10-CM | POA: Diagnosis not present

## 2019-11-11 DIAGNOSIS — Z961 Presence of intraocular lens: Secondary | ICD-10-CM | POA: Diagnosis not present

## 2019-11-11 DIAGNOSIS — H353132 Nonexudative age-related macular degeneration, bilateral, intermediate dry stage: Secondary | ICD-10-CM | POA: Diagnosis not present

## 2019-11-11 DIAGNOSIS — H43393 Other vitreous opacities, bilateral: Secondary | ICD-10-CM | POA: Diagnosis not present

## 2019-11-11 DIAGNOSIS — D3131 Benign neoplasm of right choroid: Secondary | ICD-10-CM | POA: Diagnosis not present

## 2019-11-18 DIAGNOSIS — B20 Human immunodeficiency virus [HIV] disease: Secondary | ICD-10-CM | POA: Diagnosis not present

## 2019-11-18 DIAGNOSIS — Z0001 Encounter for general adult medical examination with abnormal findings: Secondary | ICD-10-CM | POA: Diagnosis not present

## 2019-11-18 DIAGNOSIS — Z6829 Body mass index (BMI) 29.0-29.9, adult: Secondary | ICD-10-CM | POA: Diagnosis not present

## 2019-11-18 DIAGNOSIS — Z1389 Encounter for screening for other disorder: Secondary | ICD-10-CM | POA: Diagnosis not present

## 2019-11-18 DIAGNOSIS — E7849 Other hyperlipidemia: Secondary | ICD-10-CM | POA: Diagnosis not present

## 2019-11-18 DIAGNOSIS — R001 Bradycardia, unspecified: Secondary | ICD-10-CM | POA: Diagnosis not present

## 2019-11-18 DIAGNOSIS — Z86010 Personal history of colonic polyps: Secondary | ICD-10-CM | POA: Diagnosis not present

## 2019-11-18 DIAGNOSIS — E559 Vitamin D deficiency, unspecified: Secondary | ICD-10-CM | POA: Diagnosis not present

## 2019-11-18 DIAGNOSIS — D590 Drug-induced autoimmune hemolytic anemia: Secondary | ICD-10-CM | POA: Diagnosis not present

## 2020-01-05 ENCOUNTER — Ambulatory Visit (HOSPITAL_COMMUNITY)
Admission: RE | Admit: 2020-01-05 | Discharge: 2020-01-05 | Disposition: A | Discharge status: Home / Self Care | Payer: Federal, State, Local not specified - PPO | Source: Ambulatory Visit | Attending: Family Medicine | Admitting: Family Medicine

## 2020-01-05 ENCOUNTER — Other Ambulatory Visit (HOSPITAL_COMMUNITY): Payer: Self-pay | Admitting: Family Medicine

## 2020-01-05 ENCOUNTER — Other Ambulatory Visit: Payer: Self-pay

## 2020-01-05 DIAGNOSIS — R0781 Pleurodynia: Secondary | ICD-10-CM

## 2020-01-05 DIAGNOSIS — E663 Overweight: Secondary | ICD-10-CM | POA: Diagnosis not present

## 2020-01-05 DIAGNOSIS — Z6829 Body mass index (BMI) 29.0-29.9, adult: Secondary | ICD-10-CM | POA: Diagnosis not present

## 2020-01-05 DIAGNOSIS — S299XXA Unspecified injury of thorax, initial encounter: Secondary | ICD-10-CM | POA: Diagnosis not present

## 2020-01-29 ENCOUNTER — Other Ambulatory Visit: Payer: Self-pay | Admitting: Obstetrics and Gynecology

## 2020-01-29 ENCOUNTER — Ambulatory Visit
Admission: RE | Admit: 2020-01-29 | Discharge: 2020-01-29 | Disposition: A | Discharge status: Home / Self Care | Payer: Federal, State, Local not specified - PPO | Source: Ambulatory Visit | Attending: Obstetrics and Gynecology | Admitting: Obstetrics and Gynecology

## 2020-01-29 ENCOUNTER — Other Ambulatory Visit: Payer: Self-pay

## 2020-01-29 DIAGNOSIS — R921 Mammographic calcification found on diagnostic imaging of breast: Secondary | ICD-10-CM

## 2020-03-19 ENCOUNTER — Other Ambulatory Visit: Payer: Self-pay | Admitting: Family Medicine

## 2020-03-19 DIAGNOSIS — R0789 Other chest pain: Secondary | ICD-10-CM

## 2020-03-19 DIAGNOSIS — E663 Overweight: Secondary | ICD-10-CM | POA: Diagnosis not present

## 2020-03-19 DIAGNOSIS — B20 Human immunodeficiency virus [HIV] disease: Secondary | ICD-10-CM | POA: Diagnosis not present

## 2020-03-19 DIAGNOSIS — D590 Drug-induced autoimmune hemolytic anemia: Secondary | ICD-10-CM | POA: Diagnosis not present

## 2020-03-19 DIAGNOSIS — Z6829 Body mass index (BMI) 29.0-29.9, adult: Secondary | ICD-10-CM | POA: Diagnosis not present

## 2020-03-22 ENCOUNTER — Other Ambulatory Visit: Payer: Self-pay | Admitting: Family Medicine

## 2020-03-22 DIAGNOSIS — R0789 Other chest pain: Secondary | ICD-10-CM

## 2020-03-22 DIAGNOSIS — Z8673 Personal history of transient ischemic attack (TIA), and cerebral infarction without residual deficits: Secondary | ICD-10-CM

## 2020-03-22 DIAGNOSIS — B20 Human immunodeficiency virus [HIV] disease: Secondary | ICD-10-CM

## 2020-03-30 ENCOUNTER — Ambulatory Visit (HOSPITAL_COMMUNITY)
Admission: RE | Admit: 2020-03-30 | Discharge: 2020-03-30 | Disposition: A | Discharge status: Home / Self Care | Payer: Federal, State, Local not specified - PPO | Source: Ambulatory Visit | Attending: Family Medicine | Admitting: Family Medicine

## 2020-03-30 ENCOUNTER — Other Ambulatory Visit: Payer: Self-pay

## 2020-03-30 DIAGNOSIS — R0789 Other chest pain: Secondary | ICD-10-CM | POA: Diagnosis not present

## 2020-03-30 DIAGNOSIS — J984 Other disorders of lung: Secondary | ICD-10-CM | POA: Diagnosis not present

## 2020-03-30 DIAGNOSIS — B20 Human immunodeficiency virus [HIV] disease: Secondary | ICD-10-CM

## 2020-03-30 DIAGNOSIS — S2231XA Fracture of one rib, right side, initial encounter for closed fracture: Secondary | ICD-10-CM | POA: Diagnosis not present

## 2020-03-30 DIAGNOSIS — K449 Diaphragmatic hernia without obstruction or gangrene: Secondary | ICD-10-CM | POA: Diagnosis not present

## 2020-03-30 DIAGNOSIS — Z8673 Personal history of transient ischemic attack (TIA), and cerebral infarction without residual deficits: Secondary | ICD-10-CM | POA: Diagnosis not present

## 2020-04-16 DIAGNOSIS — H353132 Nonexudative age-related macular degeneration, bilateral, intermediate dry stage: Secondary | ICD-10-CM | POA: Diagnosis not present

## 2020-04-16 DIAGNOSIS — H26493 Other secondary cataract, bilateral: Secondary | ICD-10-CM | POA: Diagnosis not present

## 2020-04-16 DIAGNOSIS — H43393 Other vitreous opacities, bilateral: Secondary | ICD-10-CM | POA: Diagnosis not present

## 2020-04-16 DIAGNOSIS — D3131 Benign neoplasm of right choroid: Secondary | ICD-10-CM | POA: Diagnosis not present

## 2020-04-16 DIAGNOSIS — H43813 Vitreous degeneration, bilateral: Secondary | ICD-10-CM | POA: Diagnosis not present

## 2020-05-24 ENCOUNTER — Other Ambulatory Visit: Payer: Self-pay

## 2020-05-24 ENCOUNTER — Other Ambulatory Visit: Payer: Federal, State, Local not specified - PPO

## 2020-05-24 DIAGNOSIS — B20 Human immunodeficiency virus [HIV] disease: Secondary | ICD-10-CM

## 2020-05-25 LAB — T-HELPER CELL (CD4) - (RCID CLINIC ONLY)
CD4 % Helper T Cell: 41 % (ref 33–65)
CD4 T Cell Abs: 288 /uL — ABNORMAL LOW (ref 400–1790)

## 2020-05-27 LAB — COMPREHENSIVE METABOLIC PANEL
AG Ratio: 2.5 (calc) (ref 1.0–2.5)
ALT: 21 U/L (ref 6–29)
AST: 23 U/L (ref 10–35)
Albumin: 4.2 g/dL (ref 3.6–5.1)
Alkaline phosphatase (APISO): 114 U/L (ref 37–153)
BUN/Creatinine Ratio: 11 (calc) (ref 6–22)
BUN: 13 mg/dL (ref 7–25)
CO2: 26 mmol/L (ref 20–32)
Calcium: 9.6 mg/dL (ref 8.6–10.4)
Chloride: 108 mmol/L (ref 98–110)
Creat: 1.21 mg/dL — ABNORMAL HIGH (ref 0.50–0.99)
Globulin: 1.7 g/dL (calc) — ABNORMAL LOW (ref 1.9–3.7)
Glucose, Bld: 84 mg/dL (ref 65–99)
Potassium: 4.4 mmol/L (ref 3.5–5.3)
Sodium: 143 mmol/L (ref 135–146)
Total Bilirubin: 0.6 mg/dL (ref 0.2–1.2)
Total Protein: 5.9 g/dL — ABNORMAL LOW (ref 6.1–8.1)

## 2020-05-27 LAB — CBC
HCT: 41.1 % (ref 35.0–45.0)
Hemoglobin: 14 g/dL (ref 11.7–15.5)
MCH: 35 pg — ABNORMAL HIGH (ref 27.0–33.0)
MCHC: 34.1 g/dL (ref 32.0–36.0)
MCV: 102.8 fL — ABNORMAL HIGH (ref 80.0–100.0)
MPV: 9.4 fL (ref 7.5–12.5)
Platelets: 215 10*3/uL (ref 140–400)
RBC: 4 10*6/uL (ref 3.80–5.10)
RDW: 11.9 % (ref 11.0–15.0)
WBC: 3.2 10*3/uL — ABNORMAL LOW (ref 3.8–10.8)

## 2020-05-27 LAB — LIPID PANEL
Cholesterol: 154 mg/dL (ref <200)
HDL: 46 mg/dL — ABNORMAL LOW (ref >=50)
LDL Cholesterol (Calc): 88 mg/dL (calc)
Non-HDL Cholesterol (Calc): 108 mg/dL (calc) (ref <130)
Total CHOL/HDL Ratio: 3.3 (calc) (ref <5.0)
Triglycerides: 104 mg/dL (ref <150)

## 2020-05-27 LAB — RPR: RPR Ser Ql: NONREACTIVE

## 2020-05-27 LAB — HIV-1 RNA QUANT-NO REFLEX-BLD
HIV 1 RNA Quant: <20 Copies/mL
HIV-1 RNA Quant, Log: <1.3 Log cps/mL

## 2020-06-08 ENCOUNTER — Encounter: Payer: Federal, State, Local not specified - PPO | Admitting: Internal Medicine

## 2020-06-15 ENCOUNTER — Other Ambulatory Visit: Payer: Self-pay

## 2020-06-15 ENCOUNTER — Ambulatory Visit: Payer: Federal, State, Local not specified - PPO | Admitting: Internal Medicine

## 2020-06-15 ENCOUNTER — Encounter: Payer: Self-pay | Admitting: Internal Medicine

## 2020-06-15 VITALS — BP 109/72 | HR 82 | Temp 97.7°F | Wt 172.0 lb

## 2020-06-15 DIAGNOSIS — B20 Human immunodeficiency virus [HIV] disease: Secondary | ICD-10-CM | POA: Diagnosis not present

## 2020-06-15 DIAGNOSIS — Z23 Encounter for immunization: Secondary | ICD-10-CM | POA: Diagnosis not present

## 2020-06-15 MED ORDER — TRIUMEQ 600-50-300 MG PO TABS: 1.0000 | ORAL_TABLET | Freq: Every day | ORAL | 3 refills | Status: DC

## 2020-06-15 NOTE — Progress Notes (Signed)
Patient Active Problem List   Diagnosis Date Noted  . Hemolytic anemia (HCC)     Priority: High  . Renal insufficiency 01/09/2012    Priority: High  . Dyslipidemia 05/11/2011    Priority: High  . Human immunodeficiency virus (HIV) disease ([place]) 10/11/2006    Priority: High  . History of cardiovascular disorder 10/02/2006    Priority: High  . Depression 07/01/2019  . Headache 10/14/2015  . Vertigo 10/14/2015  . Diplopia 10/14/2015  . Hand pain 04/22/2015  . 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 Elevated alkaline phosphatase level 01/14/2013  . Hip pain, left 03/08/2011  . ROTATOR CUFF SYNDROME, RIGHT 07/12/2010  . 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 RECTAL BLEEDING 10/11/2006  . SYMPTOM, ABNORMAL LOSS OF WEIGHT 10/11/2006  . 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 LIPODYSTROPHY 10/02/2006  . PERIPHERAL NEUROPATHY 10/02/2006  . ARTHROSCOPY, KNEE, HX OF 10/02/2006    Patient's Medications  New Prescriptions   No medications on file  Previous Medications   ATORVASTATIN (LIPITOR) 10 MG TABLET    Take 10 mg by mouth daily.   BETA CAROTENE W/MINERALS (OCUVITE) TABLET    Take 2 tablets by mouth daily.   CALCIUM CARBONATE (OS-CAL) 600 MG TABS    Take 1,200 mg by mouth daily.    CHOLECALCIFEROL (VITAMIN D-3) 1000 UNITS CAPS    Take 1,000 Units by mouth daily.   CLOPIDOGREL (PLAVIX) 75 MG TABLET    TAKE 1 TABLET BY MOUTH DAILY   OMEGA-3 FATTY ACIDS (FISH OIL) 1000 MG CAPS    Take 1 capsule by mouth daily.    ZOLPIDEM (AMBIEN) 10 MG TABLET    Take 1/2-1 tablet at bedtime as needed for trouble sleeping  Modified Medications   Modified Medication Previous Medication   ABACAVIR-DOLUTEGRAVIR-LAMIVUDINE (TRIUMEQ) 600-50-300 MG TABLET TRIUMEQ 600-50-300 MG tablet      Take 1 tablet by mouth daily.    TAKE 1 TABLET DAILY.  Discontinued Medications   No medications on file    Subjective: Theresa Morrison is in for her routine HIV follow-up visit.  She denies any  problems obtaining, taking or tolerating her Triumeq and does not recall missing any doses.  She would like to get her influenza vaccine today.  She has not taken a Covid vaccine because of some concerns about potential side effects.  She says that she would "like to see it used a little longer before deciding if she will take it."  Her husband had several days of low-grade fever and achiness after taking his Covid vaccine.  Her chronic depression is stable.  She says that she knows how to get in touch with our behavioral health counselor.  She continues to provide care for her grandchildren ages 90 months and 2 years 4 days each week.  Review of Systems: Review of Systems  Constitutional: Positive for malaise/fatigue. Negative for weight loss.  Respiratory: Negative for cough.   Cardiovascular: Negative for chest pain.  Psychiatric/Behavioral: Positive for depression.    Past Medical History:  Diagnosis Date  . Anemia   . Colon polyps    adenomatous  . CVA (cerebral vascular accident) ([place]) 2000  . Headache   . HIV positive ([place]) 1987  . Hypercholesterolemia   . Hyperlipidemia   . Internal hemorrhoids   . Shingles   . Toxoplasmosis     Social History   Tobacco Use  . Smoking status: Never Smoker  . Smokeless tobacco:  Never Used  Substance Use Topics  . Alcohol use: No    Alcohol/week: 0.0 standard drinks  . Drug use: No    Family History  Problem Relation Age of Onset  . Heart attack Father   . High Cholesterol Father   . Breast cancer Mother 5       mets to lung and brain  . Prostate cancer Brother   . Prostate cancer Brother   . Stroke Neg Hx   . Migraines Neg Hx   . Neuropathy Neg Hx   . Colon cancer Neg Hx   . Esophageal cancer Neg Hx   . Pancreatic cancer Neg Hx   . Stomach cancer Neg Hx   . Liver disease Neg Hx     Allergies  Allergen Reactions  . Penicillins     REACTION: facial swelling  . Stavudine     REACTION: peripheral neuropathy  .  Sulfamethoxazole-Trimethoprim     REACTION: hives  . Sulfa Antibiotics Rash    Health Maintenance  Topic Date Due  . MAMMOGRAM  02/11/2016  . PAP SMEAR-Modifier  02/11/2018  . COLONOSCOPY  06/29/2024  . TETANUS/TDAP  03/28/2028  . INFLUENZA VACCINE  Completed  . Hepatitis C Screening  Completed  . HIV Screening  Completed    Objective:  Vitals:   06/15/20 1507  BP: 109/72  Pulse: 82  Temp: 97.7 F (36.5 C)  TempSrc: Oral  Weight: 172 lb (78 kg)   Body mass index is 29.52 kg/m.  Physical Exam Constitutional:      Comments: Her weight is stable.  Cardiovascular:     Rate and Rhythm: Normal rate.  Pulmonary:     Effort: Pulmonary effort is normal.  Psychiatric:        Mood and Affect: Mood normal.     Lab Results Lab Results  Component Value Date   WBC 3.2 (L) 05/24/2020   HGB 14.0 05/24/2020   HCT 41.1 05/24/2020   MCV 102.8 (H) 05/24/2020   PLT 215 05/24/2020    Lab Results  Component Value Date   CREATININE 1.21 (H) 05/24/2020   BUN 13 05/24/2020   NA 143 05/24/2020   K 4.4 05/24/2020   CL 108 05/24/2020   CO2 26 05/24/2020    Lab Results  Component Value Date   ALT 21 05/24/2020   AST 23 05/24/2020   ALKPHOS 144 (H) 09/04/2017   BILITOT 0.6 05/24/2020    Lab Results  Component Value Date   CHOL 154 05/24/2020   HDL 46 (L) 05/24/2020   LDLCALC 88 05/24/2020   TRIG 104 05/24/2020   CHOLHDL 3.3 05/24/2020   Lab Results  Component Value Date   LABRPR NON-REACTIVE 05/24/2020   HIV 1 RNA Quant  Date Value  05/24/2020 <20 Copies/mL  06/13/2019 <20 NOT DETECTED copies/mL  05/29/2018 <20 NOT DETECTED copies/mL   CD4 T Cell Abs (/uL)  Date Value  05/24/2020 288 (L)  06/13/2019 281 (L)  05/29/2018 280 (L)     Problem List Items Addressed This Visit      High   Human immunodeficiency virus (HIV) disease ([place])    Her infection remains under excellent, long-term control.  She will continue Triumeq and follow-up after lab work 1 year.  I encouraged her to go ahead and start her Covid vaccine series.  She did receive her influenza vaccine here today.      Relevant Medications   abacavir-dolutegravir-lamiVUDine (TRIUMEQ) 600-50-300 MG tablet   Other Relevant Orders  CBC   T-helper cell (CD4)- ([facility] clinic only)   Comprehensive metabolic panel   Lipid panel   RPR   HIV-1 RNA quant-no reflex-bld    Other Visit Diagnoses    Need for immunization against influenza    -  Primary   Relevant Orders   Flu Vaccine QUAD 36+ mos IM (Completed)        Michel Bickers, MD [facility] for [place] [phone] pager   [phone] cell 06/15/2020, 3:26 PM

## 2020-06-15 NOTE — Assessment & Plan Note (Addendum)
Her infection remains under excellent, long-term control.  She will continue Triumeq and follow-up after lab work 1 year. I encouraged her to go ahead and start her Covid vaccine series.  She did receive her influenza vaccine here today.

## 2020-07-13 ENCOUNTER — Other Ambulatory Visit: Payer: Self-pay

## 2020-07-13 DIAGNOSIS — B20 Human immunodeficiency virus [HIV] disease: Secondary | ICD-10-CM

## 2020-07-13 MED ORDER — TRIUMEQ 600-50-300 MG PO TABS: 1.0000 | ORAL_TABLET | Freq: Every day | ORAL | 3 refills | Status: DC

## 2020-08-12 ENCOUNTER — Other Ambulatory Visit: Payer: Self-pay

## 2020-08-12 ENCOUNTER — Ambulatory Visit
Admission: RE | Admit: 2020-08-12 | Discharge: 2020-08-12 | Disposition: A | Discharge status: Home / Self Care | Payer: Federal, State, Local not specified - PPO | Source: Ambulatory Visit | Attending: Obstetrics and Gynecology | Admitting: Obstetrics and Gynecology

## 2020-08-12 DIAGNOSIS — R921 Mammographic calcification found on diagnostic imaging of breast: Secondary | ICD-10-CM

## 2020-11-15 DIAGNOSIS — Z681 Body mass index (BMI) 19 or less, adult: Secondary | ICD-10-CM | POA: Diagnosis not present

## 2020-11-15 DIAGNOSIS — J069 Acute upper respiratory infection, unspecified: Secondary | ICD-10-CM | POA: Diagnosis not present

## 2020-11-16 ENCOUNTER — Telehealth: Payer: Self-pay

## 2020-11-16 NOTE — Telephone Encounter (Signed)
Received fax from [facility] with a possible non-adherence notification. RN called patient, she states she has had no issues getting her medication and cannot recall any missed doses.   Beryle Flock, RN

## 2021-02-09 ENCOUNTER — Other Ambulatory Visit (HOSPITAL_COMMUNITY): Payer: Self-pay | Admitting: Family Medicine

## 2021-02-09 ENCOUNTER — Other Ambulatory Visit: Payer: Self-pay

## 2021-02-09 ENCOUNTER — Ambulatory Visit (HOSPITAL_COMMUNITY)
Admission: RE | Admit: 2021-02-09 | Discharge: 2021-02-09 | Disposition: A | Discharge status: Home / Self Care | Payer: Federal, State, Local not specified - PPO | Source: Ambulatory Visit | Attending: Family Medicine | Admitting: Family Medicine

## 2021-02-09 DIAGNOSIS — D590 Drug-induced autoimmune hemolytic anemia: Secondary | ICD-10-CM | POA: Diagnosis not present

## 2021-02-09 DIAGNOSIS — E663 Overweight: Secondary | ICD-10-CM | POA: Diagnosis not present

## 2021-02-09 DIAGNOSIS — Z1389 Encounter for screening for other disorder: Secondary | ICD-10-CM | POA: Diagnosis not present

## 2021-02-09 DIAGNOSIS — R079 Chest pain, unspecified: Secondary | ICD-10-CM | POA: Diagnosis not present

## 2021-02-09 DIAGNOSIS — B20 Human immunodeficiency virus [HIV] disease: Secondary | ICD-10-CM | POA: Diagnosis not present

## 2021-02-09 DIAGNOSIS — Z8673 Personal history of transient ischemic attack (TIA), and cerebral infarction without residual deficits: Secondary | ICD-10-CM | POA: Diagnosis not present

## 2021-02-09 DIAGNOSIS — Z6829 Body mass index (BMI) 29.0-29.9, adult: Secondary | ICD-10-CM | POA: Diagnosis not present

## 2021-02-09 DIAGNOSIS — E782 Mixed hyperlipidemia: Secondary | ICD-10-CM | POA: Diagnosis not present

## 2021-02-09 DIAGNOSIS — Z1331 Encounter for screening for depression: Secondary | ICD-10-CM | POA: Diagnosis not present

## 2021-02-09 DIAGNOSIS — M25552 Pain in left hip: Secondary | ICD-10-CM | POA: Diagnosis not present

## 2021-02-09 DIAGNOSIS — Z0001 Encounter for general adult medical examination with abnormal findings: Secondary | ICD-10-CM | POA: Diagnosis not present

## 2021-02-09 DIAGNOSIS — E559 Vitamin D deficiency, unspecified: Secondary | ICD-10-CM | POA: Diagnosis not present

## 2021-02-09 DIAGNOSIS — E441 Mild protein-calorie malnutrition: Secondary | ICD-10-CM | POA: Diagnosis not present

## 2021-03-02 DIAGNOSIS — Z6828 Body mass index (BMI) 28.0-28.9, adult: Secondary | ICD-10-CM | POA: Diagnosis not present

## 2021-03-02 DIAGNOSIS — N952 Postmenopausal atrophic vaginitis: Secondary | ICD-10-CM | POA: Diagnosis not present

## 2021-03-02 DIAGNOSIS — Z01419 Encounter for gynecological examination (general) (routine) without abnormal findings: Secondary | ICD-10-CM | POA: Diagnosis not present

## 2021-04-08 DIAGNOSIS — H35372 Puckering of macula, left eye: Secondary | ICD-10-CM | POA: Diagnosis not present

## 2021-04-08 DIAGNOSIS — D3131 Benign neoplasm of right choroid: Secondary | ICD-10-CM | POA: Diagnosis not present

## 2021-04-08 DIAGNOSIS — H5319 Other subjective visual disturbances: Secondary | ICD-10-CM | POA: Diagnosis not present

## 2021-04-08 DIAGNOSIS — H353132 Nonexudative age-related macular degeneration, bilateral, intermediate dry stage: Secondary | ICD-10-CM | POA: Diagnosis not present

## 2021-05-24 ENCOUNTER — Other Ambulatory Visit: Payer: Self-pay

## 2021-05-24 ENCOUNTER — Other Ambulatory Visit: Payer: Federal, State, Local not specified - PPO

## 2021-05-24 DIAGNOSIS — B20 Human immunodeficiency virus [HIV] disease: Secondary | ICD-10-CM | POA: Diagnosis not present

## 2021-05-25 LAB — T-HELPER CELL (CD4) - (RCID CLINIC ONLY)
CD4 % Helper T Cell: 46 % (ref 33–65)
CD4 T Cell Abs: 289 /uL — ABNORMAL LOW (ref 400–1790)

## 2021-05-27 LAB — CBC
HCT: 38.8 % (ref 35.0–45.0)
Hemoglobin: 13.5 g/dL (ref 11.7–15.5)
MCH: 35.1 pg — ABNORMAL HIGH (ref 27.0–33.0)
MCHC: 34.8 g/dL (ref 32.0–36.0)
MCV: 100.8 fL — ABNORMAL HIGH (ref 80.0–100.0)
MPV: 9.9 fL (ref 7.5–12.5)
Platelets: 156 10*3/uL (ref 140–400)
RBC: 3.85 10*6/uL (ref 3.80–5.10)
RDW: 12.1 % (ref 11.0–15.0)
WBC: 2.5 10*3/uL — ABNORMAL LOW (ref 3.8–10.8)

## 2021-05-27 LAB — COMPREHENSIVE METABOLIC PANEL
AG Ratio: 2.6 (calc) — ABNORMAL HIGH (ref 1.0–2.5)
ALT: 17 U/L (ref 6–29)
AST: 21 U/L (ref 10–35)
Albumin: 4.2 g/dL (ref 3.6–5.1)
Alkaline phosphatase (APISO): 131 U/L (ref 37–153)
BUN/Creatinine Ratio: 14 (calc) (ref 6–22)
BUN: 17 mg/dL (ref 7–25)
CO2: 29 mmol/L (ref 20–32)
Calcium: 9.3 mg/dL (ref 8.6–10.4)
Chloride: 107 mmol/L (ref 98–110)
Creat: 1.25 mg/dL — ABNORMAL HIGH (ref 0.50–1.05)
Globulin: 1.6 g/dL (calc) — ABNORMAL LOW (ref 1.9–3.7)
Glucose, Bld: 80 mg/dL (ref 65–99)
Potassium: 4 mmol/L (ref 3.5–5.3)
Sodium: 143 mmol/L (ref 135–146)
Total Bilirubin: 0.5 mg/dL (ref 0.2–1.2)
Total Protein: 5.8 g/dL — ABNORMAL LOW (ref 6.1–8.1)

## 2021-05-27 LAB — LIPID PANEL
Cholesterol: 147 mg/dL (ref <200)
HDL: 52 mg/dL (ref >=50)
LDL Cholesterol (Calc): 80 mg/dL (calc)
Non-HDL Cholesterol (Calc): 95 mg/dL (calc) (ref <130)
Total CHOL/HDL Ratio: 2.8 (calc) (ref <5.0)
Triglycerides: 69 mg/dL (ref <150)

## 2021-05-27 LAB — HIV-1 RNA QUANT-NO REFLEX-BLD
HIV 1 RNA Quant: NOT DETECTED Copies/mL
HIV-1 RNA Quant, Log: NOT DETECTED Log cps/mL

## 2021-05-27 LAB — RPR: RPR Ser Ql: NONREACTIVE

## 2021-06-08 ENCOUNTER — Ambulatory Visit: Payer: Federal, State, Local not specified - PPO | Admitting: Internal Medicine

## 2021-06-20 DIAGNOSIS — Z23 Encounter for immunization: Secondary | ICD-10-CM | POA: Diagnosis not present

## 2021-06-22 ENCOUNTER — Ambulatory Visit: Payer: Federal, State, Local not specified - PPO | Admitting: Internal Medicine

## 2021-06-22 ENCOUNTER — Encounter: Payer: Self-pay | Admitting: Internal Medicine

## 2021-06-22 ENCOUNTER — Other Ambulatory Visit: Payer: Self-pay

## 2021-06-22 DIAGNOSIS — B20 Human immunodeficiency virus [HIV] disease: Secondary | ICD-10-CM

## 2021-06-22 DIAGNOSIS — N289 Disorder of kidney and ureter, unspecified: Secondary | ICD-10-CM

## 2021-06-22 DIAGNOSIS — F331 Major depressive disorder, recurrent, moderate: Secondary | ICD-10-CM

## 2021-06-22 MED ORDER — TRIUMEQ 600-50-300 MG PO TABS: 1.0000 | ORAL_TABLET | Freq: Every day | ORAL | 3 refills | Status: DC

## 2021-06-22 NOTE — Assessment & Plan Note (Signed)
She has stable, chronic renal insufficiency.  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  There is no nephrotoxicity associated with Triumeq.

## 2021-06-22 NOTE — Assessment & Plan Note (Signed)
She has chronic depression with some impending grief reaction superimposed.  She knows to contact us if she is feeling overwhelmed.

## 2021-06-22 NOTE — Progress Notes (Signed)
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   BETA CAROTENE W/MINERALS (OCUVITE) TABLET    Take 2 tablets by mouth daily.   CALCIUM CARBONATE (OS-CAL) 600 MG TABS    Take 1,200 mg by mouth daily.    CHOLECALCIFEROL (VITAMIN D-3) 1000 UNITS CAPS    Take 1,000 Units by mouth daily.   CLOPIDOGREL (PLAVIX) 75 MG TABLET    TAKE 1 TABLET BY MOUTH DAILY   OMEGA-3 FATTY ACIDS (FISH OIL) 1000 MG CAPS    Take 1 capsule by mouth daily.    ZOLPIDEM (AMBIEN) 10 MG TABLET    Take 1/2-1 tablet at bedtime as needed for trouble sleeping  Modified Medications   Modified Medication Previous Medication   ABACAVIR-DOLUTEGRAVIR-LAMIVUDINE (TRIUMEQ) 600-50-300 MG TABLET abacavir-dolutegravir-lamiVUDine (TRIUMEQ) 600-50-300 MG tablet      Take 1 tablet by mouth daily.    Take 1 tablet by mouth daily.  Discontinued Medications   No medications on file    Subjective: Theresa Morrison is in for her routine HIV  follow-up visit.  She has not had any problems obtaining, taking or tolerating her Triumeq and does not recall missing any doses.  She has not on any new medications.  She has had her annual influenza vaccine.  She continues to be a COVID-vaccine hold out stating that she is worried about possible side effects.  She remains under a great deal of stress at home.  She is the primary caregiver for her grandchildren after her son left her almost 2 years ago.  Her brother is actively dying of metastatic prostate cancer.  Her chronic depression is unchanged.  She states that "it is nothing that I cannot deal with."  Review of Systems: Review of Systems  Constitutional:  Negative for fever and weight loss.  Respiratory:  Negative for cough.   Cardiovascular:  Negative for chest pain.  Gastrointestinal:  Negative for abdominal pain, diarrhea, nausea and vomiting.  Psychiatric/Behavioral:  Positive for depression. The patient is not nervous/anxious.    Past Medical History:  Diagnosis Date   Anemia    Colon polyps    adenomatous   CVA (cerebral vascular accident) ([place]) 2000   Headache    HIV positive ([place]) 1987   Hypercholesterolemia    Hyperlipidemia    Internal hemorrhoids    Shingles    Toxoplasmosis     Social History  Tobacco Use   Smoking status: Never   Smokeless tobacco: Never  Substance Use Topics   Alcohol use: No    Alcohol/week: 0.0 standard drinks   Drug use: No    Family History  Problem Relation Age of Onset   Heart attack Father    High Cholesterol Father    Breast cancer Mother 57       mets to lung and brain   Prostate cancer Brother    Prostate cancer Brother    Stroke Neg Hx    Migraines Neg Hx    Neuropathy Neg Hx    Colon cancer Neg Hx    Esophageal cancer Neg Hx    Pancreatic cancer Neg Hx    Stomach cancer Neg Hx    Liver disease Neg Hx     Allergies  Allergen Reactions   Penicillins     REACTION: facial swelling   Stavudine     REACTION:  peripheral neuropathy   Sulfamethoxazole-Trimethoprim     REACTION: hives   Sulfa Antibiotics Rash    Health Maintenance  Topic Date Due   PAP SMEAR-Modifier  02/11/2018   Pneumococcal Vaccine 59-33 Years old (4 - PPSV23 if available, else PCV20) 05/14/2022   MAMMOGRAM  08/12/2022   COLONOSCOPY (Pts 45-19yrs Insurance coverage will need to be confirmed)  06/29/2024   TETANUS/TDAP  03/28/2028   INFLUENZA VACCINE  Completed   Hepatitis C Screening  Completed   HIV Screening  Completed   Zoster Vaccines- Shingrix  Completed   HPV VACCINES  Aged Out    Objective:  Vitals:   06/22/21 0906  BP: 109/73  Pulse: 73  Resp: 16  SpO2: 100%  Weight: 165 lb 6.4 oz (75 kg)  Height: 5\' 4"  (1.626 m)   Body mass index is 28.39 kg/m.  Physical Exam Constitutional:      Comments: She is quiet and reserved as usual.  Cardiovascular:     Rate and Rhythm: Normal rate and regular rhythm.     Heart sounds: No murmur heard. Pulmonary:     Effort: Pulmonary effort is normal.     Breath sounds: Normal breath sounds.  Skin:    Findings: No rash.  Neurological:     General: No focal deficit present.  Psychiatric:        Mood and Affect: Mood normal.    Lab Results Lab Results  Component Value Date   WBC 2.5 (L) 05/24/2021   HGB 13.5 05/24/2021   HCT 38.8 05/24/2021   MCV 100.8 (H) 05/24/2021   PLT 156 05/24/2021    Lab Results  Component Value Date   CREATININE 1.25 (H) 05/24/2021   BUN 17 05/24/2021   NA 143 05/24/2021   K 4.0 05/24/2021   CL 107 05/24/2021   CO2 29 05/24/2021    Lab Results  Component Value Date   ALT 17 05/24/2021   AST 21 05/24/2021   ALKPHOS 144 (H) 09/04/2017   BILITOT 0.5 05/24/2021    Lab Results  Component Value Date   CHOL 147 05/24/2021   HDL 52 05/24/2021   LDLCALC 80 05/24/2021   TRIG 69 05/24/2021   CHOLHDL 2.8 05/24/2021   Lab Results  Component Value Date   LABRPR NON-REACTIVE 05/24/2021   HIV 1 RNA Quant  Date Value   05/24/2021 Not Detected Copies/mL  05/24/2020 <20 Copies/mL  06/13/2019 <20 NOT DETECTED copies/mL   CD4 T Cell Abs (/uL)  Date Value  05/24/2021 289 (L)  05/24/2020 288 (  L)  06/13/2019 281 (L)     Problem List Items Addressed This Visit       High   Human immunodeficiency virus (HIV) disease ([place])    Her infection remains under excellent, long-term control.  She will continue Triumeq and follow-up after lab work in 1 year.      Relevant Medications   abacavir-dolutegravir-lamiVUDine (TRIUMEQ) 600-50-300 MG tablet   Other Relevant Orders   CBC   T-helper cell (CD4)- ([facility] clinic only)   Comprehensive metabolic panel   Lipid panel   RPR   HIV-1 RNA quant-no reflex-bld   Renal insufficiency    She has stable, chronic renal insufficiency.  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  There is no nephrotoxicity associated with Triumeq.        Unprioritized   Depression    She has chronic depression with some impending grief reaction superimposed.  She knows to contact us if she is feeling overwhelmed.         Michel Bickers, MD [facility] for [facility] [place] [facility] [phone] pager   [phone] cell 06/22/2021, 9:27 AM

## 2021-06-22 NOTE — Assessment & Plan Note (Signed)
Her infection remains under excellent, long-term control.  She will continue Triumeq and follow-up after lab work in 1 year. 

## 2021-07-14 ENCOUNTER — Other Ambulatory Visit: Payer: Self-pay | Admitting: Internal Medicine

## 2021-07-14 DIAGNOSIS — B20 Human immunodeficiency virus [HIV] disease: Secondary | ICD-10-CM

## 2021-07-15 ENCOUNTER — Other Ambulatory Visit: Payer: Self-pay

## 2021-08-12 ENCOUNTER — Other Ambulatory Visit: Payer: Self-pay | Admitting: Obstetrics and Gynecology

## 2021-08-12 DIAGNOSIS — R921 Mammographic calcification found on diagnostic imaging of breast: Secondary | ICD-10-CM

## 2021-09-15 ENCOUNTER — Ambulatory Visit
Admission: RE | Admit: 2021-09-15 | Discharge: 2021-09-15 | Disposition: A | Discharge status: Home / Self Care | Payer: Federal, State, Local not specified - PPO | Source: Ambulatory Visit | Attending: Obstetrics and Gynecology | Admitting: Obstetrics and Gynecology

## 2021-09-15 DIAGNOSIS — R921 Mammographic calcification found on diagnostic imaging of breast: Secondary | ICD-10-CM

## 2021-10-19 ENCOUNTER — Encounter: Payer: Self-pay | Admitting: Cardiology

## 2021-10-19 ENCOUNTER — Other Ambulatory Visit: Payer: Self-pay

## 2021-10-19 ENCOUNTER — Inpatient Hospital Stay: Payer: Federal, State, Local not specified - PPO

## 2021-10-19 ENCOUNTER — Ambulatory Visit: Payer: Federal, State, Local not specified - PPO | Admitting: Cardiology

## 2021-10-19 VITALS — BP 115/72 | HR 69 | Temp 97.2°F | Resp 16 | Ht 64.0 in | Wt 167.4 lb

## 2021-10-19 DIAGNOSIS — R002 Palpitations: Secondary | ICD-10-CM

## 2021-10-19 DIAGNOSIS — R9431 Abnormal electrocardiogram [ECG] [EKG]: Secondary | ICD-10-CM

## 2021-10-19 NOTE — Progress Notes (Signed)
? ?Primary Physician/Referring:  Sharilyn Sites, MD ? ?Patient ID: Theresa Morrison, female    DOB: 1956/06/17, 66 y.o.   MRN: [id number] ? ?No chief complaint on file. ? ?HPI:   ? ?Theresa Morrison  is a 66 y.o. Caucasian female patient with hyperlipidemia, well controlled on atorvastatin, HIV disease presently on antiretroviral therapy referred to me for evaluation of new onset palpitations that started in December 2022, 3 months ago, described as skipped beats that can last a whole day and sometimes several minutes with no other associated symptoms but feels poorly and makes her anxious. ? ?Past Medical History:  ?Diagnosis Date  ? Anemia   ? Colon polyps   ? adenomatous  ? CVA (cerebral vascular accident) ([place]) 2000  ? Headache   ? HIV positive ([place]) 1987  ? Hypercholesterolemia   ? Hyperlipidemia   ? Internal hemorrhoids   ? Shingles   ? Toxoplasmosis   ? ?Past Surgical History:  ?Procedure Laterality Date  ? APPENDECTOMY  2001  ? KNEE ARTHROSCOPY Right   ? x 2  ? SHOULDER SURGERY Right   ? TONSILLECTOMY    ? ULNAR NERVE TRANSPOSITION Left 03/20/2017  ? Procedure: LEFT ELBOW ULNAR NERVE DECOMPRESSION;  Surgeon: [name], MD;  Location: [place];  Service: Orthopedics;  Laterality: Left;  ? ?Family History  ?Problem Relation Age of Onset  ? Heart attack Father   ? High Cholesterol Father   ? Breast cancer Mother 61  ?     mets to lung and brain  ? Prostate cancer Brother   ? Prostate cancer Brother   ? Stroke Neg Hx   ? Migraines Neg Hx   ? Neuropathy Neg Hx   ? Colon cancer Neg Hx   ? Esophageal cancer Neg Hx   ? Pancreatic cancer Neg Hx   ? Stomach cancer Neg Hx   ? Liver disease Neg Hx   ?  ?Social History  ? ?Tobacco Use  ? Smoking status: Never  ? Smokeless tobacco: Never  ?Substance Use Topics  ? Alcohol use: No  ?  Alcohol/week: 0.0 standard drinks  ? ?Marital Status: Married  ?ROS  ?Review of Systems  ?Cardiovascular:  Positive for palpitations. Negative for chest pain, dyspnea on  exertion and leg swelling.  ?Objective  ?There were no vitals taken for this visit. There is no height or weight on file to calculate BMI.  ? ?  06/22/2021  ?  9:06 AM 06/15/2020  ?  3:07 PM 07/01/2019  ? 11:31 AM  ?Vitals with BMI  ?Height '5\' 4"'$     ?Weight 165 lbs 6 oz 172 lbs 168 lbs  ?BMI 28.38  28.82  ?Systolic 194 174   ?Diastolic 73 72   ?Pulse 73 82   ?  ?Physical Exam ?Neck:  ?   Vascular: No JVD.  ?Cardiovascular:  ?   Rate and Rhythm: Normal rate and regular rhythm.  ?   Pulses: Intact distal pulses.  ?   Heart sounds: Normal heart sounds. No murmur heard. ?  No gallop.  ?Pulmonary:  ?   Effort: Pulmonary effort is normal.  ?   Breath sounds: Normal breath sounds.  ?Abdominal:  ?   General: Bowel sounds are normal.  ?   Palpations: Abdomen is soft.  ?Musculoskeletal:  ?   Right lower leg: No edema.  ?   Left lower leg: No edema.  ?  ? ?Medications and allergies  ? ?Allergies  ?Allergen Reactions  ?  Penicillins   ?  REACTION: facial swelling  ? Stavudine   ?  REACTION: peripheral neuropathy  ? Sulfamethoxazole-Trimethoprim   ?  REACTION: hives  ? Sulfa Antibiotics Rash  ?  ? ?Medication list after today's encounter  ? ?Current Outpatient Medications:  ?  atorvastatin (LIPITOR) 10 MG tablet, Take 10 mg by mouth daily., Disp: , Rfl:  ?  beta carotene w/minerals (OCUVITE) tablet, Take 2 tablets by mouth daily., Disp: , Rfl:  ?  calcium carbonate (OS-CAL) 600 MG TABS, Take 1,200 mg by mouth daily. , Disp: , Rfl:  ?  Cholecalciferol (VITAMIN D-3) 1000 units CAPS, Take 1,000 Units by mouth daily., Disp: , Rfl:  ?  clopidogrel (PLAVIX) 75 MG tablet, TAKE 1 TABLET BY MOUTH DAILY, Disp: 90 tablet, Rfl: 1 ?  Omega-3 Fatty Acids (FISH OIL) 1000 MG CAPS, Take 1 capsule by mouth daily. , Disp: , Rfl:  ?  TRIUMEQ 600-50-300 MG tablet, TAKE 1 TABLET DAILY, Disp: 90 tablet, Rfl: 3 ?  zolpidem (AMBIEN) 10 MG tablet, Take 1/2-1 tablet at bedtime as needed for trouble sleeping (Patient not taking: Reported on 06/22/2021),  Disp: 30 tablet, Rfl: 3 ? ?Laboratory examination:  ? ?Recent Labs  ?  05/24/21 ?0919  ?NA 143  ?K 4.0  ?CL 107  ?CO2 29  ?GLUCOSE 80  ?BUN 17  ?CREATININE 1.25*  ?CALCIUM 9.3  ? ?CrCl cannot be calculated (Patient's most recent lab result is older than the maximum 21 days allowed.).  ? ?  Latest Ref Rng & Units 05/24/2021  ?  9:19 AM 05/24/2020  ?  9:54 AM 06/13/2019  ?  9:10 AM  ?CMP  ?Glucose 65 - 99 mg/dL 80   84   82    ?BUN 7 - 25 mg/dL '17   13   12    '$ ?Creatinine 0.50 - 1.05 mg/dL 1.25   1.21   1.41    ?Sodium 135 - 146 mmol/L 143   143   143    ?Potassium 3.5 - 5.3 mmol/L 4.0   4.4   3.8    ?Chloride 98 - 110 mmol/L 107   108   108    ?CO2 20 - 32 mmol/L '29   26   27    '$ ?Calcium 8.6 - 10.4 mg/dL 9.3   9.6   9.3    ?Total Protein 6.1 - 8.1 g/dL 5.8   5.9   5.9    ?Total Bilirubin 0.2 - 1.2 mg/dL 0.5   0.6   0.4    ?AST 10 - 35 U/L '21   23   15    '$ ?ALT 6 - 29 U/L '17   21   11    '$ ? ? ?  Latest Ref Rng & Units 05/24/2021  ?  9:19 AM 05/24/2020  ?  9:54 AM 06/13/2019  ?  9:10 AM  ?CBC  ?WBC 3.8 - 10.8 Thousand/uL 2.5   3.2   3.0    ?Hemoglobin 11.7 - 15.5 g/dL 13.5   14.0   13.7    ?Hematocrit 35.0 - 45.0 % 38.8   41.1   40.0    ?Platelets 140 - 400 Thousand/uL 156   215   149    ? ? ?Lipid Panel ?Recent Labs  ?  05/24/21 ?0919  ?CHOL 147  ?TRIG 69  ?[place] 80  ?HDL 52  ?CHOLHDL 2.8  ? ?Lipid Panel  ?   ?Component Value Date/Time  ? CHOL 147 05/24/2021  0919  ? TRIG 69 05/24/2021 0919  ? HDL 52 05/24/2021 0919  ? CHOLHDL 2.8 05/24/2021 0919  ? VLDL 25 09/15/2016 0936  ? [place] 80 05/24/2021 0919  ?  ? ?HEMOGLOBIN A1C ?Lab Results  ?Component Value Date  ? HGBA1C 4.2 07/15/2014  ? MPG 74 07/15/2014  ? ?TSH ?08/05/2021: 2.490, normal. ?Radiology:  ? ?Cardiac Studies:  ? ?NA ? ?EKG:  ? ?EKG 10/19/2021: Normal sinus rhythm, normal axis, low-voltage complexes, abnormal EKG. ? ?Assessment  ? ?  ICD-10-CM   ?1. Palpitations  R00.2 LONG TERM MONITOR (3-14 DAYS)  ?  ?2. Nonspecific abnormal electrocardiogram (ECG) (EKG)   R94.31 PCV ECHOCARDIOGRAM COMPLETE  ?  ?  ? ?There are no discontinued medications.  ?No orders of the defined types were placed in this encounter. ? ?Orders Placed This Encounter  ?Procedures  ? LONG TERM MONITOR (3-14 DAYS)  ?  Standing Status:   Future  ?  Number of Occurrences:   1  ?  Standing Expiration Date:   11/19/2021  ?  Order Specific Question:   Where should this test be performed?  ?  Answer:   [facility]  ?  Order Specific Question:   Does the patient have an implanted cardiac device?  ?  Answer:   No  ?  Order Specific Question:   Prescribed days of wear  ?  Answer:   7  ?  Order Specific Question:   Type of enrollment  ?  Answer:   Home Enrollment  ?  Order Specific Question:   Vendor:  ?  Answer:   [employer]  ?  Order Specific Question:   Release to patient  ?  Answer:   Immediate  ? [facility] ECHOCARDIOGRAM COMPLETE  ?  Standing Status:   Future  ?  Standing Expiration Date:   10/20/2022  ? ?Recommendations:  ? ?KONYA FAUBLE is a 66 y.o. Caucasian female patient with hyperlipidemia, well controlled on atorvastatin, HIV disease presently on antiretroviral therapy referred to me for evaluation of new onset palpitations that started in December 2022, 3 months ago, described as skipped beats that can last a whole day and sometimes several minutes with no other associated symptoms but feels poorly and makes her anxious. ? ?Physical exam is unremarkable, suspect either PACs and PVCs and not atrial fibrillation or any other complex arrhythmias.  Patient extremely symptomatic, hence will perform 7 days of extended EKG monitoring and also in view of low voltage EKG HIV disease, would like to exclude cardiomyopathy, obtain echocardiogram. ? ?Patient has HIV-associated peripheral neuropathy and also for palpitations, empirically will try vitamin B1, B6 and B12 supplements, office visit in 6 weeks ? ? ? ?Adrian Prows, MD, [facility] ?10/19/2021, 6:21 PM ?Office: [phone] ?

## 2021-10-20 ENCOUNTER — Encounter: Payer: Self-pay | Admitting: Cardiology

## 2021-10-21 NOTE — Progress Notes (Signed)
EKG 10/19/2021: Sinus bradycardia at rate of 55 bpm, normal axis, incomplete right bundle branch block.  Poor R wave progression, cannot exclude anteroseptal infarct old.  Low-voltage complexes.  Pulmonary disease pattern.  Nonspecific T abnormality.

## 2021-10-24 ENCOUNTER — Ambulatory Visit: Payer: Federal, State, Local not specified - PPO

## 2021-10-24 DIAGNOSIS — R9431 Abnormal electrocardiogram [ECG] [EKG]: Secondary | ICD-10-CM

## 2021-12-01 ENCOUNTER — Ambulatory Visit: Payer: Federal, State, Local not specified - PPO | Admitting: Cardiology

## 2021-12-01 ENCOUNTER — Encounter: Payer: Self-pay | Admitting: Cardiology

## 2021-12-01 VITALS — BP 112/70 | HR 80 | Temp 98.4°F | Resp 16 | Ht 64.0 in | Wt 168.0 lb

## 2021-12-01 DIAGNOSIS — R9431 Abnormal electrocardiogram [ECG] [EKG]: Secondary | ICD-10-CM

## 2021-12-01 DIAGNOSIS — R002 Palpitations: Secondary | ICD-10-CM

## 2021-12-01 NOTE — Progress Notes (Signed)
? ?Primary Physician/Referring:  Sharilyn Sites, MD ? ?Patient ID: Theresa Morrison, female    DOB: Dec 05, 1955, 66 y.o.   MRN: [id number] ? ?No chief complaint on file. ? ?HPI:   ? ?Theresa Morrison  is a 66 y.o. Caucasian female patient with hyperlipidemia, well controlled on atorvastatin, HIV disease presently on antiretroviral therapy referred to me for evaluation of new onset palpitations that started in December 2022, 3 months ago, described as skipped beats that can last a whole day and sometimes several minutes with no other associated symptoms but feels poorly and makes her anxious. ? ?I did see her about 6 weeks ago, I had ordered an echocardiogram and also extended EKG monitoring, she now presents for follow-up.  States that episodes of palpitations have persisted, she has tried taking vitamin B12 and B6 supplements.  No dizziness or syncope. ? ?Social History  ? ?Tobacco Use  ? Smoking status: Never  ? Smokeless tobacco: Never  ?Substance Use Topics  ? Alcohol use: No  ?  Alcohol/week: 0.0 standard drinks  ? ?Marital Status: Married  ?ROS  ?Review of Systems  ?Cardiovascular:  Positive for palpitations. Negative for chest pain, dyspnea on exertion and leg swelling.  ?Objective  ?There were no vitals taken for this visit. There is no height or weight on file to calculate BMI.  ? ?  10/20/2021  ?  9:43 AM 06/22/2021  ?  9:06 AM 06/15/2020  ?  3:07 PM  ?Vitals with BMI  ?Height '5\' 4"'$  '5\' 4"'$    ?Weight 167 lbs 6 oz 165 lbs 6 oz 172 lbs  ?BMI 28.72 28.38   ?Systolic 672 094 709  ?Diastolic 72 73 72  ?Pulse 69 73 82  ?  ?Physical Exam ?Neck:  ?   Vascular: No JVD.  ?Cardiovascular:  ?   Rate and Rhythm: Normal rate and regular rhythm.  ?   Pulses: Intact distal pulses.  ?   Heart sounds: Normal heart sounds. No murmur heard. ?  No gallop.  ?Pulmonary:  ?   Effort: Pulmonary effort is normal.  ?   Breath sounds: Normal breath sounds.  ?Abdominal:  ?   General: Bowel sounds are normal.  ?   Palpations: Abdomen  is soft.  ?Musculoskeletal:  ?   Right lower leg: No edema.  ?   Left lower leg: No edema.  ?  ? ?Medications and allergies  ? ?Allergies  ?Allergen Reactions  ? Penicillins   ?  REACTION: facial swelling  ? Stavudine   ?  REACTION: peripheral neuropathy  ? Sulfamethoxazole-Trimethoprim   ?  REACTION: hives  ? Sulfa Antibiotics Rash  ? ?  ? ?Medication list after today's encounter  ? ?Current Outpatient Medications:  ?  atorvastatin (LIPITOR) 10 MG tablet, Take 10 mg by mouth daily., Disp: , Rfl:  ?  calcium carbonate (OS-CAL) 600 MG TABS, Take 1,200 mg by mouth daily. , Disp: , Rfl:  ?  Cholecalciferol (VITAMIN D-3) 1000 units CAPS, Take 1,000 Units by mouth daily., Disp: , Rfl:  ?  clopidogrel (PLAVIX) 75 MG tablet, TAKE 1 TABLET BY MOUTH DAILY, Disp: 90 tablet, Rfl: 1 ?  Multiple Vitamins-Minerals (MACULAR HEALTH FORMULA PO), Take 1 capsule by mouth daily., Disp: , Rfl:  ?  Omega-3 Fatty Acids (FISH OIL) 1000 MG CAPS, Take 1 capsule by mouth daily. , Disp: , Rfl:  ?  TRIUMEQ 600-50-300 MG tablet, TAKE 1 TABLET DAILY, Disp: 90 tablet, Rfl: 3 ? ?Laboratory examination:  ? ?  Recent Labs  ?  05/24/21 ?0919  ?NA 143  ?K 4.0  ?CL 107  ?CO2 29  ?GLUCOSE 80  ?BUN 17  ?CREATININE 1.25*  ?CALCIUM 9.3  ? ? ?CrCl cannot be calculated (Patient's most recent lab result is older than the maximum 21 days allowed.).  ? ?  Latest Ref Rng & Units 05/24/2021  ?  9:19 AM 05/24/2020  ?  9:54 AM 06/13/2019  ?  9:10 AM  ?CMP  ?Glucose 65 - 99 mg/dL 80   84   82    ?BUN 7 - 25 mg/dL '17   13   12    '$ ?Creatinine 0.50 - 1.05 mg/dL 1.25   1.21   1.41    ?Sodium 135 - 146 mmol/L 143   143   143    ?Potassium 3.5 - 5.3 mmol/L 4.0   4.4   3.8    ?Chloride 98 - 110 mmol/L 107   108   108    ?CO2 20 - 32 mmol/L '29   26   27    '$ ?Calcium 8.6 - 10.4 mg/dL 9.3   9.6   9.3    ?Total Protein 6.1 - 8.1 g/dL 5.8   5.9   5.9    ?Total Bilirubin 0.2 - 1.2 mg/dL 0.5   0.6   0.4    ?AST 10 - 35 U/L '21   23   15    '$ ?ALT 6 - 29 U/L '17   21   11    '$ ? ? ?  Latest  Ref Rng & Units 05/24/2021  ?  9:19 AM 05/24/2020  ?  9:54 AM 06/13/2019  ?  9:10 AM  ?CBC  ?WBC 3.8 - 10.8 Thousand/uL 2.5   3.2   3.0    ?Hemoglobin 11.7 - 15.5 g/dL 13.5   14.0   13.7    ?Hematocrit 35.0 - 45.0 % 38.8   41.1   40.0    ?Platelets 140 - 400 Thousand/uL 156   215   149    ? ? ?Lipid Panel ?Recent Labs  ?  05/24/21 ?0919  ?CHOL 147  ?TRIG 69  ?[place] 80  ?HDL 52  ?CHOLHDL 2.8  ? ? ?HEMOGLOBIN A1C ?Lab Results  ?Component Value Date  ? HGBA1C 4.2 07/15/2014  ? MPG 74 07/15/2014  ? ?TSH ?08/05/2021: 2.490, normal. ?Radiology:  ? ?Cardiac Studies:  ? ?Zio Patch Extended out patient EKG monitoring 7 days starting 2023: ?Dominant rhythm is normal sinus rhythm.  Minimum heart rate 46, maximum heart rate 141 bpm with average rate of 73 bpm.  There were rare PACs and PVCs.  There were rare ventricular trigeminy.  There were 5 diary entries correlated with PVCs and ventricular trigeminy. ? ?PCV ECHOCARDIOGRAM COMPLETE 10/24/2021  ?Left ventricle cavity is normal in size and wall thickness. Normal global wall motion. Normal LV systolic function with EF 56%. Normal diastolic filling pattern. ?Trace MR, TR. ?No evidence of pulmonary hypertension. ?Unlike previous study in 2015, mitral valve prolapse not appreciated on this study. ?   ? ?EKG:  ? ?EKG 10/19/2021: Normal sinus rhythm, normal axis, low-voltage complexes, abnormal EKG. ? ?Assessment  ? ?  ICD-10-CM   ?1. Palpitations  R00.2   ?  ?2. Nonspecific abnormal electrocardiogram (ECG) (EKG)  R94.31   ?  ?  ? ?There are no discontinued medications.  ?No orders of the defined types were placed in this encounter. ? ?No orders of the defined types were placed  in this encounter. ? ?Recommendations:  ? ?Theresa Morrison is a 66 y.o. Caucasian female patient with hyperlipidemia, well controlled on atorvastatin, HIV disease presently on antiretroviral therapy referred to me for evaluation of new onset palpitations that started in December 2022, 3 months ago,  described as skipped beats that can last a whole day and sometimes several minutes with no other associated symptoms but feels poorly and makes her anxious. ? ?Physical exam is unremarkable, her symptoms correlate with PVCs and not atrial fibrillation or any other complex arrhythmias.  Given normal LVEF by echocardiogram and no significant structural abnormality, I simply reassured her.  She could certainly try a low-dose of propranolol like 10 or 20 mg as needed 3 times daily however patient states that as long as she knows they are benign, she would like to limit that and she does not want to add any new medications.  She is willing to try fish oil capsules. ? ?She is presently taking B12 and B6 supplements.  Peripheral neuropathy and palpitations, advised her to continue the same for now and if there is no benefit in peripheral neuropathy, she can certainly discontinue the supplements.  From cardiac standpoint otherwise no further testing is indicated.  I will see her back on a as needed basis. ? ? ?Adrian Prows, MD, [facility] ?12/01/2021, 7:38 AM ?Office: [phone] ?

## 2022-06-07 ENCOUNTER — Other Ambulatory Visit: Payer: Federal, State, Local not specified - PPO

## 2022-06-07 ENCOUNTER — Other Ambulatory Visit: Payer: Self-pay

## 2022-06-07 DIAGNOSIS — B20 Human immunodeficiency virus [HIV] disease: Secondary | ICD-10-CM

## 2022-06-08 LAB — T-HELPER CELL (CD4) - (RCID CLINIC ONLY)
CD4 % Helper T Cell: 43 % (ref 33–65)
CD4 T Cell Abs: 289 /uL — ABNORMAL LOW (ref 400–1790)

## 2022-06-10 LAB — CBC
HCT: 39.1 % (ref 35.0–45.0)
Hemoglobin: 13.9 g/dL (ref 11.7–15.5)
MCH: 35.1 pg — ABNORMAL HIGH (ref 27.0–33.0)
MCHC: 35.5 g/dL (ref 32.0–36.0)
MCV: 98.7 fL (ref 80.0–100.0)
MPV: 9.9 fL (ref 7.5–12.5)
Platelets: 167 10*3/uL (ref 140–400)
RBC: 3.96 10*6/uL (ref 3.80–5.10)
RDW: 11.8 % (ref 11.0–15.0)
WBC: 2.9 10*3/uL — ABNORMAL LOW (ref 3.8–10.8)

## 2022-06-10 LAB — RPR: RPR Ser Ql: NONREACTIVE

## 2022-06-10 LAB — HIV-1 RNA QUANT-NO REFLEX-BLD
HIV 1 RNA Quant: NOT DETECTED Copies/mL
HIV-1 RNA Quant, Log: NOT DETECTED Log cps/mL

## 2022-06-10 LAB — LIPID PANEL
Cholesterol: 148 mg/dL (ref <200)
HDL: 55 mg/dL (ref >=50)
LDL Cholesterol (Calc): 76 mg/dL (calc)
Non-HDL Cholesterol (Calc): 93 mg/dL (calc) (ref <130)
Total CHOL/HDL Ratio: 2.7 (calc) (ref <5.0)
Triglycerides: 83 mg/dL (ref <150)

## 2022-06-10 LAB — COMPREHENSIVE METABOLIC PANEL
AG Ratio: 2.5 (calc) (ref 1.0–2.5)
ALT: 17 U/L (ref 6–29)
AST: 18 U/L (ref 10–35)
Albumin: 4.5 g/dL (ref 3.6–5.1)
Alkaline phosphatase (APISO): 145 U/L (ref 37–153)
BUN/Creatinine Ratio: 14 (calc) (ref 6–22)
BUN: 16 mg/dL (ref 7–25)
CO2: 29 mmol/L (ref 20–32)
Calcium: 9.8 mg/dL (ref 8.6–10.4)
Chloride: 107 mmol/L (ref 98–110)
Creat: 1.11 mg/dL — ABNORMAL HIGH (ref 0.50–1.05)
Globulin: 1.8 g/dL (calc) — ABNORMAL LOW (ref 1.9–3.7)
Glucose, Bld: 80 mg/dL (ref 65–99)
Potassium: 4 mmol/L (ref 3.5–5.3)
Sodium: 142 mmol/L (ref 135–146)
Total Bilirubin: 0.6 mg/dL (ref 0.2–1.2)
Total Protein: 6.3 g/dL (ref 6.1–8.1)

## 2022-06-21 ENCOUNTER — Encounter: Payer: Federal, State, Local not specified - PPO | Admitting: Internal Medicine

## 2022-06-29 ENCOUNTER — Other Ambulatory Visit: Payer: Self-pay

## 2022-06-29 ENCOUNTER — Encounter: Payer: Self-pay | Admitting: Internal Medicine

## 2022-06-29 ENCOUNTER — Ambulatory Visit (INDEPENDENT_AMBULATORY_CARE_PROVIDER_SITE_OTHER): Payer: Medicare Other | Admitting: Internal Medicine

## 2022-06-29 VITALS — BP 109/73 | HR 64 | Temp 97.6°F | Ht 64.0 in | Wt 165.0 lb

## 2022-06-29 DIAGNOSIS — N289 Disorder of kidney and ureter, unspecified: Secondary | ICD-10-CM

## 2022-06-29 DIAGNOSIS — B20 Human immunodeficiency virus [HIV] disease: Secondary | ICD-10-CM

## 2022-06-29 DIAGNOSIS — D5919 Other autoimmune hemolytic anemia: Secondary | ICD-10-CM

## 2022-06-29 MED ORDER — TRIUMEQ 600-50-300 MG PO TABS: 1.0000 | ORAL_TABLET | Freq: Every day | ORAL | 3 refills | Status: DC

## 2022-06-29 NOTE — Assessment & Plan Note (Signed)
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

## 2022-06-29 NOTE — Assessment & Plan Note (Signed)
Her infection remains under excellent, long-term control.  She will continue Triumeq and follow-up after lab work in 1 year.

## 2022-06-29 NOTE — Assessment & Plan Note (Signed)
She has mild, stable chronic renal insufficiency.  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  Her blood pressure is low normal.

## 2022-06-29 NOTE — Progress Notes (Signed)
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   CALCIUM CARBONATE (OS-CAL) 600 MG TABS    Take 1,200 mg by mouth daily.    CHOLECALCIFEROL (VITAMIN D-3) 1000 UNITS CAPS    Take 1,000 Units by mouth daily.   CLOPIDOGREL (PLAVIX) 75 MG TABLET    TAKE 1 TABLET BY MOUTH DAILY   MULTIPLE VITAMINS-MINERALS (MACULAR HEALTH FORMULA PO)    Take 1 capsule by mouth daily.  Modified Medications   Modified Medication Previous Medication   ABACAVIR-DOLUTEGRAVIR-LAMIVUDINE (TRIUMEQ) 600-50-300 MG TABLET TRIUMEQ 600-50-300 MG tablet      Take 1 tablet by mouth daily.    TAKE 1 TABLET DAILY  Discontinued Medications   No medications on file    Subjective: Theresa Morrison is in for her routine HIV follow-up visit.  She denies any problems obtaining, taking or tolerating her Triumeq.  She takes it each evening and does not recall missing any doses.  She is not on any new medications since her last visit.  She has  had her annual influenza vaccine but has never taken a COVID-vaccine and does not want one today.  She is not getting any regular exercise.  She denies feeling depressed.  Review of Systems: Review of Systems  Constitutional:  Negative for fever and weight loss.  Psychiatric/Behavioral:  Negative for depression.     Past Medical History:  Diagnosis Date   Anemia    Colon polyps    adenomatous   CVA (cerebral vascular accident) ([place]) 2000   Headache    HIV positive ([place]) 1987   Hypercholesterolemia    Hyperlipidemia    Internal hemorrhoids    Shingles    Toxoplasmosis     Social History   Tobacco Use   Smoking status: Never   Smokeless tobacco: Never  Substance Use Topics   Alcohol use: No    Alcohol/week: 0.0 standard drinks of alcohol   Drug use: No    Family History  Problem Relation Age of Onset   Heart attack Father    High Cholesterol Father    Breast cancer Mother 76       mets to lung and brain   Prostate cancer Brother    Prostate cancer Brother    Stroke Neg Hx    Migraines Neg Hx    Neuropathy Neg Hx    Colon cancer Neg Hx  Esophageal cancer Neg Hx    Pancreatic cancer Neg Hx    Stomach cancer Neg Hx    Liver disease Neg Hx     Allergies  Allergen Reactions   Penicillins     REACTION: facial swelling   Stavudine     REACTION: peripheral neuropathy   Sulfamethoxazole-Trimethoprim     REACTION: hives   Sulfa Antibiotics Rash    Health Maintenance  Topic Date Due   Medicare Annual Wellness (AWV)  Never done   COVID-19 Vaccine (1) Never done   PAP SMEAR-Modifier  02/11/2018   INFLUENZA VACCINE  02/21/2022   Pneumonia Vaccine 17+ Years old (3 - PPSV23 or PCV20) 05/14/2022   MAMMOGRAM  09/16/2023   COLONOSCOPY (Pts 45-66yr Insurance coverage will need to be confirmed)  06/29/2024   DTaP/Tdap/Td (2 - Td or Tdap) 03/28/2028   DEXA SCAN  Completed   Hepatitis C Screening  Completed   HIV Screening  Completed   Zoster Vaccines- Shingrix   Completed   HPV VACCINES  Aged Out    Objective:  Vitals:   06/29/22 0912  BP: 109/73  Pulse: 64  Temp: 97.6 F (36.4 C)  TempSrc: Oral  SpO2: 99%  Weight: 165 lb (74.8 kg)  Height: '5\' 4"'$  (1.626 m)   Body mass index is 28.32 kg/m.  Physical Exam Constitutional:      Comments: She is quiet and pleasant as usual.  There is no change in her central adiposity or her weight.  Cardiovascular:     Rate and Rhythm: Normal rate and regular rhythm.     Heart sounds: No murmur heard. Pulmonary:     Effort: Pulmonary effort is normal.     Breath sounds: Normal breath sounds.  Psychiatric:        Mood and Affect: Mood normal.     Lab Results Lab Results  Component Value Date   WBC 2.9 (L) 06/07/2022   HGB 13.9 06/07/2022   HCT 39.1 06/07/2022   MCV 98.7 06/07/2022   PLT 167 06/07/2022    Lab Results  Component Value Date   CREATININE 1.11 (H) 06/07/2022   BUN 16 06/07/2022   NA 142 06/07/2022   K 4.0 06/07/2022   CL 107 06/07/2022   CO2 29 06/07/2022    Lab Results  Component Value Date   ALT 17 06/07/2022   AST 18 06/07/2022   ALKPHOS 144 (H) 09/04/2017   BILITOT 0.6 06/07/2022    Lab Results  Component Value Date   CHOL 148 06/07/2022   HDL 55 06/07/2022   LDLCALC 76 06/07/2022   TRIG 83 06/07/2022   CHOLHDL 2.7 06/07/2022   Lab Results  Component Value Date   LABRPR NON-REACTIVE 06/07/2022   HIV 1 RNA Quant (Copies/mL)  Date Value  06/07/2022 Not Detected  05/24/2021 Not Detected  05/24/2020 <20   CD4 T Cell Abs (/uL)  Date Value  06/07/2022 289 (L)  05/24/2021 289 (L)  05/24/2020 288 (L)     Problem List Items Addressed This Visit       High   Human immunodeficiency virus (HIV) disease ([place]    Her infection remains under excellent, long-term control.  She will continue Triumeq and follow-up after lab work in 1 year.      Relevant Medications   abacavir-dolutegravir-lamiVUDine (TRIUMEQ) 600-50-300 MG tablet   Other  Relevant Orders   CBC   [facility]CD[facility]   Comprehensive metabolic panel   Lipid  panel   RPR   HIV-1 RNA quant-no reflex-bld   Renal insufficiency    She has mild, stable chronic renal insufficiency.  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  Her blood pressure is low normal.      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         Michel Bickers, MD [facility] [phone] pager   [phone] cell 06/29/2022, 9:26 AM

## 2022-07-03 ENCOUNTER — Encounter (INDEPENDENT_AMBULATORY_CARE_PROVIDER_SITE_OTHER): Payer: Federal, State, Local not specified - PPO | Admitting: Ophthalmology

## 2022-07-03 DIAGNOSIS — H353132 Nonexudative age-related macular degeneration, bilateral, intermediate dry stage: Secondary | ICD-10-CM | POA: Diagnosis not present

## 2022-07-03 DIAGNOSIS — D3131 Benign neoplasm of right choroid: Secondary | ICD-10-CM

## 2022-07-03 DIAGNOSIS — H43813 Vitreous degeneration, bilateral: Secondary | ICD-10-CM

## 2022-07-05 ENCOUNTER — Encounter (INDEPENDENT_AMBULATORY_CARE_PROVIDER_SITE_OTHER): Payer: Federal, State, Local not specified - PPO | Admitting: Ophthalmology

## 2022-07-05 DIAGNOSIS — H26492 Other secondary cataract, left eye: Secondary | ICD-10-CM | POA: Diagnosis not present

## 2022-07-05 DIAGNOSIS — H353221 Exudative age-related macular degeneration, left eye, with active choroidal neovascularization: Secondary | ICD-10-CM

## 2022-07-05 DIAGNOSIS — H353112 Nonexudative age-related macular degeneration, right eye, intermediate dry stage: Secondary | ICD-10-CM

## 2022-07-05 DIAGNOSIS — D3132 Benign neoplasm of left choroid: Secondary | ICD-10-CM | POA: Diagnosis not present

## 2022-08-02 ENCOUNTER — Encounter (INDEPENDENT_AMBULATORY_CARE_PROVIDER_SITE_OTHER): Payer: Federal, State, Local not specified - PPO | Admitting: Ophthalmology

## 2022-09-15 ENCOUNTER — Other Ambulatory Visit: Payer: Self-pay | Admitting: Obstetrics and Gynecology

## 2022-09-15 DIAGNOSIS — Z1231 Encounter for screening mammogram for malignant neoplasm of breast: Secondary | ICD-10-CM

## 2022-10-18 ENCOUNTER — Other Ambulatory Visit (HOSPITAL_COMMUNITY): Payer: Self-pay | Admitting: Family Medicine

## 2022-10-18 DIAGNOSIS — R221 Localized swelling, mass and lump, neck: Secondary | ICD-10-CM

## 2022-10-31 ENCOUNTER — Ambulatory Visit (HOSPITAL_COMMUNITY)
Admission: RE | Admit: 2022-10-31 | Discharge: 2022-10-31 | Disposition: A | Discharge status: Home / Self Care | Payer: Medicare Other | Source: Ambulatory Visit | Attending: Family Medicine | Admitting: Family Medicine

## 2022-10-31 ENCOUNTER — Other Ambulatory Visit (HOSPITAL_COMMUNITY): Payer: Self-pay | Admitting: Family Medicine

## 2022-10-31 DIAGNOSIS — R221 Localized swelling, mass and lump, neck: Secondary | ICD-10-CM

## 2022-11-01 ENCOUNTER — Ambulatory Visit
Admission: RE | Admit: 2022-11-01 | Discharge: 2022-11-01 | Disposition: A | Discharge status: Home / Self Care | Payer: Federal, State, Local not specified - PPO | Source: Ambulatory Visit | Attending: Obstetrics and Gynecology | Admitting: Obstetrics and Gynecology

## 2022-11-01 DIAGNOSIS — Z1231 Encounter for screening mammogram for malignant neoplasm of breast: Secondary | ICD-10-CM

## 2022-12-11 LAB — AMB RESULTS CONSOLE CBG: Glucose: 110

## 2022-12-11 NOTE — Progress Notes (Signed)
Pt has PCP. No SDOH needs at this time.  

## 2022-12-28 ENCOUNTER — Encounter: Payer: Self-pay | Admitting: *Deleted

## 2022-12-28 NOTE — Progress Notes (Signed)
Pt attended screening event on 12/11/2022, where screening results were wnl. At the event, Pt shared that she had PCP and no SDOH insecurities was indicated. Per chart review, Pt PCP is listed as Dr. Assunta Found at [facility]. [facility] is not visibile in [facility]. Pt was contacted by phone for PCP status. Pt phone number was invalid. Due to this, [facility] was contacted to verify if Pt is established at the medical office. Front desk receptions confirmed that Pt is established Pt and was recently seen on March. Chart review show that Pt has upcoming appointment with [facility] on 06/15/23 and 06/29/23. No additional health equity team indicated at this time.

## 2023-02-19 ENCOUNTER — Ambulatory Visit (HOSPITAL_COMMUNITY)
Admission: RE | Admit: 2023-02-19 | Discharge: 2023-02-19 | Disposition: A | Discharge status: Home / Self Care | Payer: Medicare Other | Source: Ambulatory Visit | Attending: Family Medicine | Admitting: Family Medicine

## 2023-02-19 ENCOUNTER — Other Ambulatory Visit (HOSPITAL_COMMUNITY): Payer: Self-pay | Admitting: Family Medicine

## 2023-02-19 DIAGNOSIS — S90912S Unspecified superficial injury of left ankle, sequela: Secondary | ICD-10-CM | POA: Diagnosis present

## 2023-03-01 ENCOUNTER — Ambulatory Visit: Payer: Medicare Other | Admitting: Orthopedic Surgery

## 2023-03-05 ENCOUNTER — Encounter: Payer: Self-pay | Admitting: Orthopedic Surgery

## 2023-03-05 ENCOUNTER — Ambulatory Visit (INDEPENDENT_AMBULATORY_CARE_PROVIDER_SITE_OTHER): Payer: Medicare Other | Admitting: Orthopedic Surgery

## 2023-03-05 DIAGNOSIS — S93492A Sprain of other ligament of left ankle, initial encounter: Secondary | ICD-10-CM | POA: Diagnosis not present

## 2023-03-05 NOTE — Progress Notes (Signed)
Office Visit Note   Patient: Theresa Morrison           Date of Birth: 04/07/1956           MRN: [id number] Visit Date: 03/05/2023              Requested by: Assunta Found, MD [address] [place],  [place] [address] PCP: Assunta Found, MD  Chief Complaint  Patient presents with   Left Ankle - Injury      HPI: Patient is a 67 year old woman who is seen for initial injury for her left ankle.  Patient states she was on a hike in February when she rolled her ankle.  She states she has been having persistent pain and swelling.  Assessment & Plan: Visit Diagnoses:  1. Sprain of anterior talofibular ligament of left ankle, initial encounter     Plan: Will place her in an ASO recommended Voltaren gel reevaluate in 4 weeks.  Follow-Up Instructions: Return in about 4 weeks (around 04/02/2023).   Ortho Exam  Patient is alert, oriented, no adenopathy, well-dressed, normal affect, normal respiratory effort. Examination patient has a palpable pulse.  Radiograph shows a congruent ankle without fracture.  She is tender to palpation of the anterior talofibular ligament tender to palpation over the deltoid as well as the syndesmosis.  Anterior drawer is stable.  Imaging: No results found. No images are attached to the encounter.  Labs: Lab Results  Component Value Date   HGBA1C 4.2 07/15/2014   ESRSEDRATE 9 03/09/2015   ESRSEDRATE 3 07/13/2014   CRP 0.6 03/09/2015   CRP 3.5 (H) 07/13/2014   LABORGA NO GROWTH 5 DAYS 07/13/2014   LABORGA NO GROWTH 5 DAYS 07/13/2014     Lab Results  Component Value Date   ALBUMIN 4.0 09/04/2017   ALBUMIN 4.3 03/02/2017   ALBUMIN 4.2 10/31/2016    No results found for: "MG" No results found for: "VD25OH"  No results found for: "PREALBUMIN"    Latest Ref Rng & Units 06/07/2022    9:05 AM 05/24/2021    9:19 AM 05/24/2020    9:54 AM  CBC EXTENDED  WBC 3.8 - 10.8 Thousand/uL 2.9  2.5  3.2   RBC 3.80 - 5.10 Million/uL 3.96  3.85   4.00   Hemoglobin 11.7 - 15.5 g/dL 84.6  96.2  95.2   HCT 35.0 - 45.0 % 39.1  38.8  41.1   Platelets 140 - 400 Thousand/uL 167  156  215      There is no height or weight on file to calculate BMI.  Orders:  No orders of the defined types were placed in this encounter.  No orders of the defined types were placed in this encounter.    Procedures: No procedures performed  Clinical Data: No additional findings.  ROS:  All other systems negative, except as noted in the HPI. Review of Systems  Objective: Vital Signs: There were no vitals taken for this visit.  Specialty Comments:  No specialty comments available.  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   Elevated alkaline phosphatase level 01/14/2013   Renal insufficiency 01/09/2012   Dyslipidemia 05/11/2011   Hip pain, left 03/08/2011   ROTATOR CUFF SYNDROME, RIGHT 07/12/2010   Human immunodeficiency virus (HIV) disease (HCC)  10/11/2006   HSV 10/11/2006   RECTAL BLEEDING 10/11/2006   SYMPTOM, ABNORMAL LOSS OF WEIGHT 10/11/2006   HERPES ZOSTER 10/02/2006   CHLAMYDIA TRACHOMATIS, LOWER GU 10/02/2006   LIPODYSTROPHY 10/02/2006   PERIPHERAL NEUROPATHY 10/02/2006   History of cardiovascular disorder 10/02/2006   ARTHROSCOPY, KNEE, HX OF 10/02/2006   Past Medical History:  Diagnosis Date   Anemia    Colon polyps    adenomatous   CVA (cerebral vascular accident) (H[facility]) 2000   Headache    HIV positive (H[facility]) 1987   Hypercholesterolemia    Hyperlipidemia    Internal hemorrhoids    Shingles    Toxoplasmosis     Family History  Problem Relation Age of Onset   Heart attack Father    High Cholesterol Father    Breast cancer Mother 50       mets to lung and brain   Prostate cancer Brother    Prostate cancer  Brother    Stroke Neg Hx    Migraines Neg Hx    Neuropathy Neg Hx    Colon cancer Neg Hx    Esophageal cancer Neg Hx    Pancreatic cancer Neg Hx    Stomach cancer Neg Hx    Liver disease Neg Hx     Past Surgical History:  Procedure Laterality Date   APPENDECTOMY  2001   KNEE ARTHROSCOPY Right    x 2   SHOULDER SURGERY Right    TONSILLECTOMY     ULNAR NERVE TRANSPOSITION Left 03/20/2017   Procedure: LEFT ELBOW ULNAR NERVE DECOMPRESSION;  Surgeon: Cindee Salt, MD;  Location: [facility];  Service: Orthopedics;  Laterality: Left;   Social History   Occupational History    Employer: UNEMPLOYED  Tobacco Use   Smoking status: Never   Smokeless tobacco: Never  Substance and Sexual Activity   Alcohol use: No    Alcohol/week: 0.0 standard drinks of alcohol   Drug use: No   Sexual activity: Not Currently    Comment: declined condoms 06/14/20

## 2023-04-02 ENCOUNTER — Ambulatory Visit (INDEPENDENT_AMBULATORY_CARE_PROVIDER_SITE_OTHER): Payer: Medicare Other | Admitting: Orthopedic Surgery

## 2023-04-02 ENCOUNTER — Encounter: Payer: Self-pay | Admitting: Orthopedic Surgery

## 2023-04-02 DIAGNOSIS — S93492A Sprain of other ligament of left ankle, initial encounter: Secondary | ICD-10-CM

## 2023-04-02 NOTE — Progress Notes (Signed)
Office Visit Note   Patient: Theresa Morrison           Date of Birth: 06-06-1956           MRN: [id number] Visit Date: 04/02/2023              Requested by: Assunta Found, MD [address] [place],  [place] [address] PCP: Assunta Found, MD  Chief Complaint  Patient presents with   Left Ankle - Follow-up      HPI: Patient is a 67 year old woman who is seen for initial evaluation for left ankle sprain.  Patient states she has a supination external rotation injury has had swelling has been using a ankle stabilizing orthosis and Voltaren gel.  Assessment & Plan: Visit Diagnoses:  1. High ankle sprain of left lower extremity, initial encounter     Plan: Recommended continue with the ASO and the Voltaren gel.  Discussed that the high ankle sprain will take longer to heal than a regular ankle sprain.  Follow-Up Instructions: Return if symptoms worsen or fail to improve.   Ortho Exam  Patient is alert, oriented, no adenopathy, well-dressed, normal affect, normal respiratory effort. Examination patient has minimal tenderness to palpation over the deltoid or anterior talofibular ligament.  Anterior drawer is stable.  Patient has a palpable dorsalis pedis pulse.  Patient has swelling over the syndesmosis between the tibia and fibula.  Patient is point tender to palpation over this area and lateral compression is painful consistent with a high ankle sprain.  The pain in the deltoid ligament and anterior talofibular ligament has resolved.  Imaging: No results found. No images are attached to the encounter.  Labs: Lab Results  Component Value Date   HGBA1C 4.2 07/15/2014   ESRSEDRATE 9 03/09/2015   ESRSEDRATE 3 07/13/2014   CRP 0.6 03/09/2015   CRP 3.5 (H) 07/13/2014   LABORGA NO GROWTH 5 DAYS 07/13/2014   LABORGA NO GROWTH 5 DAYS 07/13/2014     Lab Results  Component Value Date   ALBUMIN 4.0 09/04/2017   ALBUMIN 4.3 03/02/2017   ALBUMIN 4.2 10/31/2016    No  results found for: "MG" No results found for: "VD25OH"  No results found for: "PREALBUMIN"    Latest Ref Rng & Units 06/07/2022    9:05 AM 05/24/2021    9:19 AM 05/24/2020    9:54 AM  CBC EXTENDED  WBC 3.8 - 10.8 Thousand/uL 2.9  2.5  3.2   RBC 3.80 - 5.10 Million/uL 3.96  3.85  4.00   Hemoglobin 11.7 - 15.5 g/dL 19.1  47.8  29.5   HCT 35.0 - 45.0 % 39.1  38.8  41.1   Platelets 140 - 400 Thousand/uL 167  156  215      There is no height or weight on file to calculate BMI.  Orders:  No orders of the defined types were placed in this encounter.  No orders of the defined types were placed in this encounter.    Procedures: No procedures performed  Clinical Data: No additional findings.  ROS:  All other systems negative, except as noted in the HPI. Review of Systems  Objective: Vital Signs: There were no vitals taken for this visit.  Specialty Comments:  No specialty comments available.  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   Elevated alkaline phosphatase level 01/14/2013   Renal insufficiency 01/09/2012   Dyslipidemia 05/11/2011   Hip pain, left 03/08/2011   ROTATOR CUFF SYNDROME, RIGHT 07/12/2010   Human immunodeficiency virus (HIV) disease (HCC) 10/11/2006   HSV 10/11/2006   RECTAL BLEEDING 10/11/2006   SYMPTOM, ABNORMAL LOSS OF WEIGHT 10/11/2006   HERPES ZOSTER 10/02/2006   CHLAMYDIA TRACHOMATIS, LOWER GU 10/02/2006   LIPODYSTROPHY 10/02/2006   PERIPHERAL NEUROPATHY 10/02/2006   History of cardiovascular disorder 10/02/2006   ARTHROSCOPY, KNEE, HX OF 10/02/2006   Past Medical History:  Diagnosis Date   Anemia    Colon polyps    adenomatous   CVA (cerebral vascular accident) (HCC) 2000   Headache    HIV positive (HCC) 1987   Hypercholesterolemia     Hyperlipidemia    Internal hemorrhoids    Shingles    Toxoplasmosis     Family History  Problem Relation Age of Onset   Heart attack Father    High Cholesterol Father    Breast cancer Mother 50       mets to lung and brain   Prostate cancer Brother    Prostate cancer Brother    Stroke Neg Hx    Migraines Neg Hx    Neuropathy Neg Hx    Colon cancer Neg Hx    Esophageal cancer Neg Hx    Pancreatic cancer Neg Hx    Stomach cancer Neg Hx    Liver disease Neg Hx     Past Surgical History:  Procedure Laterality Date   APPENDECTOMY  2001   KNEE ARTHROSCOPY Right    x 2   SHOULDER SURGERY Right    TONSILLECTOMY     ULNAR NERVE TRANSPOSITION Left 03/20/2017   Procedure: LEFT ELBOW ULNAR NERVE DECOMPRESSION;  Surgeon: Cindee Salt, MD;  Location: [facility];  Service: Orthopedics;  Laterality: Left;   Social History   Occupational History    Employer: UNEMPLOYED  Tobacco Use   Smoking status: Never   Smokeless tobacco: Never  Substance and Sexual Activity   Alcohol use: No    Alcohol/week: 0.0 standard drinks of alcohol   Drug use: No   Sexual activity: Not Currently    Comment: declined condoms 06/14/20

## 2023-04-10 NOTE — Progress Notes (Signed)
No new SDOH needs since 12/11/22

## 2023-04-30 ENCOUNTER — Encounter: Payer: Self-pay | Admitting: *Deleted

## 2023-04-30 NOTE — Progress Notes (Signed)
Pt attended 04/09/23 screening event where her b/p was 110/72. At the event, the pt indicated her PCP was Dr. Phillips Odor and pt did not identify any SDOH insecurities. Chart review indicates pt was seen by Dr. Assunta Found at [facility] for her AWV on 10/18/22 and media referral notes indicated pt was seen by Dr. Phillips Odor on 02/19/23, where she was given a referral to ortho. Consequently, chart review confirms pt was seen by Dr. Lajoyce Corners on 03/05/23 and 9/924, and those visit notes were also sent to her PCP Dr. Phillips Odor. (PCP past visits visible in [facility] but not future appt) No additional health equity team support indicated at this time.

## 2023-06-15 ENCOUNTER — Other Ambulatory Visit: Payer: Self-pay

## 2023-06-15 ENCOUNTER — Other Ambulatory Visit: Payer: Medicare Other

## 2023-06-15 DIAGNOSIS — Z113 Encounter for screening for infections with a predominantly sexual mode of transmission: Secondary | ICD-10-CM

## 2023-06-15 DIAGNOSIS — B20 Human immunodeficiency virus [HIV] disease: Secondary | ICD-10-CM

## 2023-06-15 NOTE — Addendum Note (Signed)
Addended by: Harley Alto on: 06/15/2023 09:04 AM   Modules accepted: Orders

## 2023-06-18 LAB — COMPLETE METABOLIC PANEL WITH GFR
AG Ratio: 2.6 (calc) — ABNORMAL HIGH (ref 1.0–2.5)
ALT: 15 U/L (ref 6–29)
AST: 18 U/L (ref 10–35)
Albumin: 4.4 g/dL (ref 3.6–5.1)
Alkaline phosphatase (APISO): 141 U/L (ref 37–153)
BUN/Creatinine Ratio: 14 (calc) (ref 6–22)
BUN: 15 mg/dL (ref 7–25)
CO2: 29 mmol/L (ref 20–32)
Calcium: 9.3 mg/dL (ref 8.6–10.4)
Chloride: 107 mmol/L (ref 98–110)
Creat: 1.06 mg/dL — ABNORMAL HIGH (ref 0.50–1.05)
Globulin: 1.7 g/dL — ABNORMAL LOW (ref 1.9–3.7)
Glucose, Bld: 85 mg/dL (ref 65–99)
Potassium: 3.8 mmol/L (ref 3.5–5.3)
Sodium: 144 mmol/L (ref 135–146)
Total Bilirubin: 0.4 mg/dL (ref 0.2–1.2)
Total Protein: 6.1 g/dL (ref 6.1–8.1)
eGFR: 58 mL/min/{1.73_m2} — ABNORMAL LOW (ref >=60)

## 2023-06-18 LAB — CBC WITH DIFFERENTIAL/PLATELET
Absolute Lymphocytes: 679 {cells}/uL — ABNORMAL LOW (ref 850–3900)
Absolute Monocytes: 313 {cells}/uL (ref 200–950)
Basophils Absolute: 22 {cells}/uL (ref 0–200)
Basophils Relative: 0.7 %
Eosinophils Absolute: 0 {cells}/uL — ABNORMAL LOW (ref 15–500)
Eosinophils Relative: 0 %
HCT: 41.5 % (ref 35.0–45.0)
Hemoglobin: 14 g/dL (ref 11.7–15.5)
MCH: 34.4 pg — ABNORMAL HIGH (ref 27.0–33.0)
MCHC: 33.7 g/dL (ref 32.0–36.0)
MCV: 102 fL — ABNORMAL HIGH (ref 80.0–100.0)
MPV: 10 fL (ref 7.5–12.5)
Monocytes Relative: 10.1 %
Neutro Abs: 2086 {cells}/uL (ref 1500–7800)
Neutrophils Relative %: 67.3 %
Platelets: 175 10*3/uL (ref 140–400)
RBC: 4.07 10*6/uL (ref 3.80–5.10)
RDW: 11.8 % (ref 11.0–15.0)
Total Lymphocyte: 21.9 %
WBC: 3.1 10*3/uL — ABNORMAL LOW (ref 3.8–10.8)

## 2023-06-18 LAB — RPR: RPR Ser Ql: NONREACTIVE

## 2023-06-18 LAB — T-HELPER CELLS (CD4) COUNT (NOT AT ARMC)
Absolute CD4: 324 {cells}/uL — ABNORMAL LOW (ref 490–1740)
CD4 T Helper %: 44 % (ref 30–61)
Total lymphocyte count: 740 {cells}/uL — ABNORMAL LOW (ref 850–3900)

## 2023-06-18 LAB — HIV-1 RNA QUANT-NO REFLEX-BLD
HIV 1 RNA Quant: NOT DETECTED {copies}/mL
HIV-1 RNA Quant, Log: NOT DETECTED {Log}

## 2023-06-29 ENCOUNTER — Ambulatory Visit: Payer: Medicare Other | Admitting: Internal Medicine

## 2023-07-03 ENCOUNTER — Ambulatory Visit: Payer: Medicare Other | Admitting: Internal Medicine

## 2023-07-20 ENCOUNTER — Other Ambulatory Visit: Payer: Self-pay

## 2023-07-20 DIAGNOSIS — B20 Human immunodeficiency virus [HIV] disease: Secondary | ICD-10-CM

## 2023-07-24 ENCOUNTER — Encounter: Payer: Self-pay | Admitting: Internal Medicine

## 2023-07-24 ENCOUNTER — Ambulatory Visit (INDEPENDENT_AMBULATORY_CARE_PROVIDER_SITE_OTHER): Payer: Medicare Other | Admitting: Internal Medicine

## 2023-07-24 ENCOUNTER — Other Ambulatory Visit: Payer: Self-pay

## 2023-07-24 VITALS — BP 122/78 | HR 72 | Resp 16 | Ht 64.0 in | Wt 160.1 lb

## 2023-07-24 DIAGNOSIS — B20 Human immunodeficiency virus [HIV] disease: Secondary | ICD-10-CM

## 2023-07-24 DIAGNOSIS — E881 Lipodystrophy, not elsewhere classified: Secondary | ICD-10-CM | POA: Diagnosis not present

## 2023-07-24 DIAGNOSIS — Z8673 Personal history of transient ischemic attack (TIA), and cerebral infarction without residual deficits: Secondary | ICD-10-CM

## 2023-07-24 MED ORDER — ABACAVIR-DOLUTEGRAVIR-LAMIVUD 600-50-300 MG PO TABS: 1.0000 | ORAL_TABLET | Freq: Every day | ORAL | 3 refills | Status: DC

## 2023-07-24 NOTE — Patient Instructions (Signed)
 Think about triumeq  vs biktarvy. Abacavir  may or maynot be associated with increased risk of cardiovascular disease (abacavir  is in triumeq ). I am fine either way   Also google reprieve hiv and read about the result from the NEJM article and let me know if you want to keep the lipitor at 10 mg or go to 40 mg  See me in 1 year and we can do labs after ward.

## 2023-07-24 NOTE — Addendum Note (Signed)
 Addended by: Rutha Bouchard T on: 07/24/2023 09:30 AM   Modules accepted: Level of Service

## 2023-07-24 NOTE — Progress Notes (Signed)
 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   ATORVASTATIN  (LIPITOR) 10 MG TABLET    Take 10 mg by mouth daily.   CALCIUM  CARBONATE (OS-CAL) 600 MG TABS    Take 1,200 mg by mouth daily.    CHOLECALCIFEROL (VITAMIN D-3) 1000 UNITS CAPS    Take 1,000 Units by mouth daily.   CLOPIDOGREL  (PLAVIX ) 75 MG TABLET    TAKE 1 TABLET BY MOUTH DAILY   MULTIPLE VITAMINS-MINERALS (MACULAR HEALTH FORMULA PO)    Take 1 capsule by mouth daily.  Modified Medications   No medications on file  Discontinued Medications   No medications on file    Subjective: Theresa Morrison is in for her routine HIV follow-up visit.  She denies any problems obtaining, taking or tolerating her Triumeq .  She takes it each evening and does not recall missing any doses.  She is not on any new medications since her last visit.  She has had her annual influenza vaccine but has never taken a COVID-vaccine and does not want one today.  She is not getting any regular exercise.  She  denies feeling depressed.  ----------- 07/24/23 [facility] f/u First time seeing me Lipoatrophy -- she has abd fat and upper trunk and wonder if anything to do about it. She started hiv tx from 63s  Hiv- wants to continue triumeq   Hx stroke- maintained on plavix  (was on dual platelet therapy with aspirin  initially). She was having frequent superficial bleeding so she stopped the aspirin . She also takes statin 10 mg  Social -lives with husband -- seronegative -no smoking drinking -not working  We reviewed labs and excellent hiv control. Very compliant    Review of Systems: Review of Systems  Constitutional:  Negative for fever and weight loss.  Psychiatric/Behavioral:  Negative for depression.     Past Medical History:  Diagnosis Date   Anemia    Colon polyps    adenomatous   CVA (cerebral vascular accident) (HCC) 2000   Headache    HIV positive (HCC) 1987   Hypercholesterolemia    Hyperlipidemia    Internal hemorrhoids    Shingles    Toxoplasmosis     Social History   Tobacco Use   Smoking status: Never   Smokeless tobacco: Never  Substance Use Topics   Alcohol use: No    Alcohol/week: 0.0 standard drinks of alcohol   Drug use: No    Family History  Problem Relation Age of Onset   Heart attack Father  High Cholesterol Father    Breast cancer Mother 50       mets to lung and brain   Prostate cancer Brother    Prostate cancer Brother    Stroke Neg Hx    Migraines Neg Hx    Neuropathy Neg Hx    Colon cancer Neg Hx    Esophageal cancer Neg Hx    Pancreatic cancer Neg Hx    Stomach cancer Neg Hx    Liver disease Neg Hx     Allergies  Allergen Reactions   Penicillins     REACTION: facial swelling   Stavudine     REACTION: peripheral neuropathy   Sulfamethoxazole-Trimethoprim     REACTION: hives   Sulfa Antibiotics Rash    Health Maintenance  Topic Date Due   Medicare Annual Wellness (AWV)  Never done   Pneumonia Vaccine 74+ Years old (4  of 4 - PPSV23 or PCV20) 05/02/2023   COVID-19 Vaccine (1) 08/09/2023 (Originally 05/14/1961)   Colonoscopy  06/29/2024   MAMMOGRAM  10/31/2024   DTaP/Tdap/Td (2 - Td or Tdap) 03/28/2028   INFLUENZA VACCINE  Completed   DEXA SCAN  Completed   Hepatitis C Screening  Completed   Zoster Vaccines- Shingrix  Completed   HPV VACCINES  Aged Out    Objective:  Vitals:   07/24/23 0858  BP: 122/78  Pulse: 72  Resp: 16  Weight: 160 lb 1.6 oz (72.6 kg)  Height: 5' 4 (1.626 m)   Body mass index is 27.48 kg/m.  Physical Exam Constitutional:      Comments: She is quiet and pleasant as usual.  There is no change in her central adiposity or her weight.  Cardiovascular:     Rate and Rhythm: Normal rate and regular rhythm.     Heart sounds: No murmur heard. Pulmonary:     Effort: Pulmonary effort is normal.     Breath sounds: Normal breath sounds.  Psychiatric:        Mood and Affect: Mood normal.     Lab Results Lab Results  Component Value Date   WBC 3.1 (L) 06/15/2023   HGB 14.0 06/15/2023   HCT 41.5 06/15/2023   MCV 102.0 (H) 06/15/2023   PLT 175 06/15/2023    Lab Results  Component Value Date   CREATININE 1.06 (H) 06/15/2023   BUN 15 06/15/2023   NA 144 06/15/2023   K 3.8 06/15/2023   CL 107 06/15/2023   CO2 29 06/15/2023    Lab Results  Component Value Date   ALT 15 06/15/2023   AST 18 06/15/2023   ALKPHOS 144 (H) 09/04/2017   BILITOT 0.4 06/15/2023    Lab Results  Component Value Date   CHOL 148 06/07/2022   HDL 55 06/07/2022   LDLCALC 76 06/07/2022   TRIG 83 06/07/2022   CHOLHDL 2.7 06/07/2022   Lab Results  Component Value Date   LABRPR NON-REACTIVE 06/15/2023   HIV 1 RNA Quant (Copies/mL)  Date Value  06/15/2023 Not Detected  06/07/2022 Not Detected  05/24/2021 Not Detected   CD4 T Cell Abs (/uL)  Date Value  06/07/2022 289 (L)  05/24/2021 289 (L)  05/24/2020 288 (L)     Problem List Items Addressed This Visit       Musculoskeletal  and Integument   LIPODYSTROPHY   Other Visit Diagnoses       HIV disease (HCC)    -  Primary   Relevant Medications   abacavir -dolutegravir -lamiVUDine (TRIUMEQ ) 600-50-300 MG  tablet     History of stroke            #hiv Well controlled on triumeq  and wants to continue Discuss possible association abacavir  with cv risk but she wants to continue. Hx cva before starting triumeq     -discussed u=u -encourage compliance -continue current HIV medication -labs 1 yr during f/u -f/u in 1 yr   #hx cva #reprieve trial -- discussed with her the dose of statin/result of trial She wants to continue just the 10 mg atorvastatin  a day  -continue atorvastatin  10 mg daily    #hcm -vaccination Needs prevnar 20 but doesn't want -std Monogamous and doesn't want to do it -cancer screening Defer to pcp Pending colonoscopy/pap with pcp    Constance ONEIDA Passer, MD [facility] for Infectious Disease [facility] [phone] pager   [phone] cell 07/24/2023, 9:03 AM

## 2023-09-22 ENCOUNTER — Other Ambulatory Visit: Payer: Self-pay

## 2023-09-22 ENCOUNTER — Emergency Department (HOSPITAL_COMMUNITY)

## 2023-09-22 ENCOUNTER — Encounter (HOSPITAL_COMMUNITY): Payer: Self-pay | Admitting: Pharmacy Technician

## 2023-09-22 ENCOUNTER — Emergency Department (HOSPITAL_COMMUNITY)
Admission: EM | Admit: 2023-09-22 | Discharge: 2023-09-22 | Disposition: A | Discharge status: Home / Self Care | Attending: Emergency Medicine | Admitting: Emergency Medicine

## 2023-09-22 DIAGNOSIS — Z21 Asymptomatic human immunodeficiency virus [HIV] infection status: Secondary | ICD-10-CM | POA: Diagnosis not present

## 2023-09-22 DIAGNOSIS — Z79899 Other long term (current) drug therapy: Secondary | ICD-10-CM | POA: Diagnosis not present

## 2023-09-22 DIAGNOSIS — R531 Weakness: Secondary | ICD-10-CM | POA: Diagnosis present

## 2023-09-22 LAB — CBC WITH DIFFERENTIAL/PLATELET
Abs Immature Granulocytes: 0.01 10*3/uL (ref 0.00–0.07)
Basophils Absolute: 0 10*3/uL (ref 0.0–0.1)
Basophils Relative: 1 %
Eosinophils Absolute: 0 10*3/uL (ref 0.0–0.5)
Eosinophils Relative: 0 %
HCT: 41.6 % (ref 36.0–46.0)
Hemoglobin: 14 g/dL (ref 12.0–15.0)
Immature Granulocytes: 0 %
Lymphocytes Relative: 22 %
Lymphs Abs: 0.9 10*3/uL (ref 0.7–4.0)
MCH: 34.1 pg — ABNORMAL HIGH (ref 26.0–34.0)
MCHC: 33.7 g/dL (ref 30.0–36.0)
MCV: 101.5 fL — ABNORMAL HIGH (ref 80.0–100.0)
Monocytes Absolute: 0.4 10*3/uL (ref 0.1–1.0)
Monocytes Relative: 11 %
Neutro Abs: 2.6 10*3/uL (ref 1.7–7.7)
Neutrophils Relative %: 66 %
Platelets: 144 10*3/uL — ABNORMAL LOW (ref 150–400)
RBC: 4.1 MIL/uL (ref 3.87–5.11)
RDW: 11.6 % (ref 11.5–15.5)
WBC: 3.9 10*3/uL — ABNORMAL LOW (ref 4.0–10.5)
nRBC: 0 % (ref 0.0–0.2)

## 2023-09-22 LAB — URINALYSIS, ROUTINE W REFLEX MICROSCOPIC
Bilirubin Urine: NEGATIVE
Glucose, UA: NEGATIVE mg/dL
Hgb urine dipstick: NEGATIVE
Ketones, ur: NEGATIVE mg/dL
Nitrite: NEGATIVE
Protein, ur: NEGATIVE mg/dL
Specific Gravity, Urine: 1.005 (ref 1.005–1.030)
pH: 6 (ref 5.0–8.0)

## 2023-09-22 LAB — COMPREHENSIVE METABOLIC PANEL
ALT: 24 U/L (ref 0–44)
AST: 24 U/L (ref 15–41)
Albumin: 3.9 g/dL (ref 3.5–5.0)
Alkaline Phosphatase: 136 U/L — ABNORMAL HIGH (ref 38–126)
Anion gap: 11 (ref 5–15)
BUN: 17 mg/dL (ref 8–23)
CO2: 26 mmol/L (ref 22–32)
Calcium: 10 mg/dL (ref 8.9–10.3)
Chloride: 104 mmol/L (ref 98–111)
Creatinine, Ser: 1.09 mg/dL — ABNORMAL HIGH (ref 0.44–1.00)
GFR, Estimated: 56 mL/min — ABNORMAL LOW (ref >60)
Glucose, Bld: 71 mg/dL (ref 70–99)
Potassium: 3.8 mmol/L (ref 3.5–5.1)
Sodium: 141 mmol/L (ref 135–145)
Total Bilirubin: 0.6 mg/dL (ref 0.0–1.2)
Total Protein: 6.5 g/dL (ref 6.5–8.1)

## 2023-09-22 MED ORDER — SODIUM CHLORIDE 0.9 % IV BOLUS
1000.0000 mL | Freq: Once | INTRAVENOUS | Status: AC
  Administered 2023-09-22: 1000 mL via INTRAVENOUS

## 2023-09-22 NOTE — ED Notes (Signed)
 Patient Alert and oriented to baseline. Stable and ambulatory to baseline. Patient verbalized understanding of the discharge instructions.  Patient belongings were taken by the patient.

## 2023-09-22 NOTE — ED Triage Notes (Addendum)
 Pt here pov with reports of dizziness which initially started on Sunday. Went to PCP and was given steroids. States now she has bil leg weakness and "groggy like I just woke up from anesthesia."

## 2023-09-22 NOTE — Discharge Instructions (Signed)
 Your lab tests, exam and imaging today are all reassuring with no obvious source found for today's symptoms.  It is possible you are having residual side effects from the meclizine you are using this week as this can make you drowsy.  Since you have stopped taking this medicine look for improvement in your fatigue over the next several days.  Plan follow-up care with your primary doctor for recheck if this does not improve your symptoms.

## 2023-09-22 NOTE — ED Provider Notes (Signed)
 [facility] Provider Note   CSN: [id number] Arrival date & time: 09/22/23  1053     History  Chief Complaint  Patient presents with   Dizziness   Weakness    Theresa Morrison is a 68 y.o. female with a past medical history significant for renal insufficiency, prior history of hemolytic anemia, HIV positive compliant with her medications with stable CD4 counts, history of vertigo, distant history of CVA with occasional right hand weakness and dysarthria but not currently, presenting with complaint of generalized weakness.  She developed classic vertigo symptoms on Sunday, 1 week ago, saw her PCP earlier this week and was placed on meclizine and prednisone and her vertigo has resolved but she continues to complain of generalized weakness and "groggy like I just woke up from anesthesia".  She did have a headache earlier in the week which has improved although she still has complaint of a mild frontal discomfort, stating not enough to even take a Tylenol for.  She denies fevers, neck pain or stiffness, visual changes, chest pain, shortness of breath, no nausea vomiting or abdominal pain.  She ate breakfast prior to arrival.  Denies fevers.  She stopped taking the meclizine yesterday morning after her vertigo resolved.  The history is provided by the patient.       Home Medications Prior to Admission medications   Medication Sig Start Date End Date Taking? Authorizing Provider  abacavir-dolutegravir-lamiVUDine (TRIUMEQ) 600-50-300 MG tablet Take 1 tablet by mouth daily. 07/24/23 07/18/24 Yes Vu, Tonita Phoenix, MD  atorvastatin (LIPITOR) 10 MG tablet Take 10 mg by mouth daily.   Yes [provider]  calcium carbonate (OS-CAL) 600 MG TABS Take 1,200 mg by mouth daily.    Yes [provider]  Cholecalciferol (VITAMIN D-3) 1000 units CAPS Take 1,000 Units by mouth daily.   Yes [provider]  clopidogrel (PLAVIX) 75 MG tablet  TAKE 1 TABLET BY MOUTH DAILY 11/06/16  Yes Cliffton Asters, MD  faricimab-svoa (VABYSMO) 6 MG/0.05ML SOLN intravitreal injection 6 mg by Intravitreal route See admin instructions. Eye injection every 4 months in office   Yes [provider]  meclizine (ANTIVERT) 25 MG tablet Take 25 mg by mouth every 6 (six) hours as needed. 09/17/23  Yes [provider]  Multiple Vitamins-Minerals (MACULAR HEALTH FORMULA PO) Take 1 capsule by mouth daily.   Yes [provider]      Allergies    Penicillins, Stavudine, Sulfamethoxazole-trimethoprim, and Sulfa antibiotics    Review of Systems   Review of Systems  Constitutional:  Positive for fatigue. Negative for chills and fever.  HENT:  Negative for congestion and sore throat.   Eyes: Negative.   Respiratory:  Negative for chest tightness and shortness of breath.   Cardiovascular:  Negative for chest pain.  Gastrointestinal:  Negative for abdominal pain, nausea and vomiting.  Genitourinary: Negative.   Musculoskeletal:  Negative for arthralgias, joint swelling and neck pain.  Skin: Negative.  Negative for rash and wound.  Neurological:  Positive for dizziness, weakness and headaches. Negative for light-headedness and numbness.  Psychiatric/Behavioral: Negative.    All other systems reviewed and are negative.   Physical Exam Updated Vital Signs BP 108/63   Pulse 82   Temp 97.9 F (36.6 C) (Oral)   Resp 16   SpO2 97%  Physical Exam Vitals and nursing note reviewed.  Constitutional:      General: She is not in acute distress.  Appearance: She is well-developed.  HENT:     Head: Normocephalic and atraumatic.     Mouth/Throat:     Mouth: Mucous membranes are moist.  Eyes:     Conjunctiva/sclera: Conjunctivae normal.  Cardiovascular:     Rate and Rhythm: Normal rate and regular rhythm.     Heart sounds: Normal heart sounds.  Pulmonary:     Effort: Pulmonary effort is normal.     Breath sounds: Normal breath  sounds. No wheezing.  Abdominal:     General: Bowel sounds are normal.     Palpations: Abdomen is soft.     Tenderness: There is no abdominal tenderness.  Musculoskeletal:        General: Normal range of motion.     Cervical back: Normal range of motion.  Skin:    General: Skin is warm and dry.  Neurological:     General: No focal deficit present.     Mental Status: She is alert and oriented to person, place, and time.     Cranial Nerves: Cranial nerves 2-12 are intact. No dysarthria or facial asymmetry.     Sensory: Sensation is intact.     Motor: Motor function is intact.     Coordination: Coordination normal. Heel to Shin Test normal. Rapid alternating movements normal.     Comments: Equal grip strength.    ED Results / Procedures / Treatments   Labs (all labs ordered are listed, but only abnormal results are displayed) Labs Reviewed  CBC WITH DIFFERENTIAL/PLATELET - Abnormal; Notable for the following components:      Result Value   WBC 3.9 (*)    MCV 101.5 (*)    MCH 34.1 (*)    Platelets 144 (*)    All other components within normal limits  COMPREHENSIVE METABOLIC PANEL - Abnormal; Notable for the following components:   Creatinine, Ser 1.09 (*)    Alkaline Phosphatase 136 (*)    GFR, Estimated 56 (*)    All other components within normal limits  URINALYSIS, ROUTINE W REFLEX MICROSCOPIC - Abnormal; Notable for the following components:   Color, Urine STRAW (*)    Leukocytes,Ua MODERATE (*)    Bacteria, UA RARE (*)    All other components within normal limits    EKG None  Radiology CT Head Wo Contrast Result Date: 09/22/2023 CLINICAL DATA:  Headache EXAM: CT HEAD WITHOUT CONTRAST TECHNIQUE: Contiguous axial images were obtained from the base of the skull through the vertex without intravenous contrast. RADIATION DOSE REDUCTION: This exam was performed according to the departmental dose-optimization program which includes automated exposure control, adjustment of  the mA and/or kV according to patient size and/or use of iterative reconstruction technique. COMPARISON:  MRI 10/26/2015 FINDINGS: Brain: No acute territorial infarction, hemorrhage or intracranial mass. Focal hypodensity within the left white matter is chronic. Elsewhere mild hypodensity in the white matter consistent with chronic small vessel ischemic disease. The ventricles are nonenlarged Vascular: No hyperdense vessels.  No unexpected calcification Skull: Normal. Negative for fracture or focal lesion. Sinuses/Orbits: No acute finding. Other: None IMPRESSION: No CT evidence for acute intracranial abnormality. Electronically Signed   By: Jasmine Pang M.D.   On: 09/22/2023 15:27   DG Chest Portable 1 View Result Date: 09/22/2023 CLINICAL DATA:  Weakness with dizziness for 6 days. EXAM: PORTABLE CHEST 1 VIEW COMPARISON:  CT 03/30/2020.  Radiographs 01/05/2020 and 07/01/2014. FINDINGS: 1217 hours. The heart size and mediastinal contours are stable with a moderate size hiatal hernia. The lungs  appear clear. There is no pleural effusion or pneumothorax. No acute osseous findings are evident. Telemetry leads overlie the chest. Possible postsurgical changes at the right acromioclavicular joint. IMPRESSION: No evidence of acute cardiopulmonary process. Moderate size hiatal hernia. Electronically Signed   By: Carey Bullocks M.D.   On: 09/22/2023 12:46    Procedures Procedures    Medications Ordered in ED Medications  sodium chloride 0.9 % bolus 1,000 mL (0 mLs Intravenous Stopped 09/22/23 1401)    ED Course/ Medical Decision Making/ A&P                                 Medical Decision Making Patient presenting with generalized weakness and fatigue of unclear etiology.She did have vertigo 1 week ago and had been on meclizine when seen by her PCP, Took her last dose yesterday and the Vertigo has resolved but she has persistent generalized fatigue and weakness.   It is possible she is having side effects  from the meclizine itself making her drowsy.  Her exam and workup today is reassuring, she has a nonfocal neuroexam.  No evidence for infection or any other acute process.  She was advised to continue to hold her meclizine but would recommend close follow-up with her PCP if her symptoms are not improving over the next several days.  Amount and/or Complexity of Data Reviewed Labs: ordered.    Details: Labs reviewed and unremarkable including a c-Met, CBC and urinalysis. Radiology: ordered.    Details: Portable chest reviewed, no acute findings, no pneumonia, agree with interpretation.  CT head was also completed given recent vertigo episodes, negative for acute findings.           Final Clinical Impression(s) / ED Diagnoses Final diagnoses:  Weakness    Rx / DC Orders ED Discharge Orders     None         Victoriano Lain 09/22/23 Kristian Covey, MD 09/23/23 [phone]

## 2023-11-13 ENCOUNTER — Other Ambulatory Visit: Payer: Self-pay | Admitting: Obstetrics and Gynecology

## 2023-11-13 DIAGNOSIS — Z1231 Encounter for screening mammogram for malignant neoplasm of breast: Secondary | ICD-10-CM

## 2023-11-15 ENCOUNTER — Ambulatory Visit
Admission: RE | Admit: 2023-11-15 | Discharge: 2023-11-15 | Disposition: A | Discharge status: Home / Self Care | Source: Ambulatory Visit | Attending: Obstetrics and Gynecology | Admitting: Obstetrics and Gynecology

## 2023-11-15 DIAGNOSIS — Z1231 Encounter for screening mammogram for malignant neoplasm of breast: Secondary | ICD-10-CM

## 2023-11-28 ENCOUNTER — Ambulatory Visit (INDEPENDENT_AMBULATORY_CARE_PROVIDER_SITE_OTHER): Admitting: Family

## 2023-11-28 ENCOUNTER — Other Ambulatory Visit (INDEPENDENT_AMBULATORY_CARE_PROVIDER_SITE_OTHER): Payer: Self-pay

## 2023-11-28 ENCOUNTER — Encounter: Payer: Self-pay | Admitting: Family

## 2023-11-28 DIAGNOSIS — M25572 Pain in left ankle and joints of left foot: Secondary | ICD-10-CM | POA: Diagnosis not present

## 2023-11-28 DIAGNOSIS — G8929 Other chronic pain: Secondary | ICD-10-CM

## 2023-11-28 DIAGNOSIS — S93492A Sprain of other ligament of left ankle, initial encounter: Secondary | ICD-10-CM | POA: Diagnosis not present

## 2023-11-28 DIAGNOSIS — M25571 Pain in right ankle and joints of right foot: Secondary | ICD-10-CM | POA: Diagnosis not present

## 2023-11-28 NOTE — Progress Notes (Signed)
 Office Visit Note   Patient: Theresa Morrison           Date of Birth: 1956-01-24           MRN: [id number] Visit Date: 11/28/2023              Requested by: Minus Amel, MD [address] [place],  [place] [address] PCP: Minus Amel, MD  Chief Complaint  Patient presents with   Left Ankle - Pain      HPI: The patient is a 68 year old woman who presents today in follow-up after an ankle sprain last February.  She initially sought treatment in July of last year.  Radiographs at that time were unrevealing there was no evidence of a healed or missed fracture.  Was also seen and evaluated by Dr. Julio Ohm it was felt that she had had an syndesmosis injury.  She initially felt she was improving in the ASO and also used Voltaren gel  Reports throughout the spring when she began becoming more active she has continued to have lateral and posterior ankle pain with associated swelling  Assessment & Plan: Visit Diagnoses:  1. High ankle sprain of left lower extremity, initial encounter   2. Chronic pain of right ankle   3. Chronic pain of left ankle     Plan: High ankle sprain; left ankle osteoarthritis, traumatic.  Discussed expected course.  Offered Depo-Medrol  injection.  Patient declined recommended Voltaren gel and anti-inflammatories by mouth.  She may continue the ASO or lace up ankle boots when more active  Follow-Up Instructions: Return if symptoms worsen or fail to improve.   Ortho Exam  Patient is alert, oriented, no adenopathy, well-dressed, normal affect, normal respiratory effort.  On examination of the left ankle there is a palpable dorsalis pedis pulse.  She is tender to palpation over the anterior talofibular ligament as well as the distal fibula.  Syndesmosis tender anterior joint line tender  Anterior drawer is stable  Imaging: XR Ankle Complete Left Result Date: 11/28/2023 Radiographs of the left ankle are negative for acute finding.  There is no widening  of the syndesmosis.  Degenerative changes seen  No images are attached to the encounter.  Labs: Lab Results  Component Value Date   HGBA1C 4.2 07/15/2014   ESRSEDRATE 9 03/09/2015   ESRSEDRATE 3 07/13/2014   CRP 0.6 03/09/2015   CRP 3.5 (H) 07/13/2014   LABORGA NO GROWTH 5 DAYS 07/13/2014   LABORGA NO GROWTH 5 DAYS 07/13/2014     Lab Results  Component Value Date   ALBUMIN 3.9 09/22/2023   ALBUMIN 4.0 09/04/2017   ALBUMIN 4.3 03/02/2017    No results found for: "MG" No results found for: "VD25OH"  No results found for: "PREALBUMIN"    Latest Ref Rng & Units 09/22/2023   12:07 PM 06/15/2023    9:07 AM 06/07/2022    9:05 AM  CBC EXTENDED  WBC 4.0 - 10.5 K/uL 3.9  3.1  2.9   RBC 3.87 - 5.11 MIL/uL 4.10  4.07  3.96   Hemoglobin 12.0 - 15.0 g/dL 10.2  72.5  36.6   HCT 36.0 - 46.0 % 41.6  41.5  39.1   Platelets 150 - 400 K/uL 144  175  167   NEUT# 1.7 - 7.7 K/uL 2.6  2,086    Lymph# 0.7 - 4.0 K/uL 0.9        There is no height or weight on file to calculate BMI.  Orders:  Orders Placed  This Encounter  Procedures   XR Ankle Complete Left   No orders of the defined types were placed in this encounter.    Procedures: No procedures performed  Clinical Data: No additional findings.  ROS:  All other systems negative, except as noted in the HPI. Review of Systems  Objective: Vital Signs: There were no vitals taken for this visit.  Specialty Comments:  No specialty comments available.  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   Elevated alkaline phosphatase level 01/14/2013   Renal insufficiency 01/09/2012   Dyslipidemia 05/11/2011   Hip pain, left 03/08/2011   Disorder of bursae and tendons in shoulder region 07/12/2010   Human immunodeficiency virus  (HIV) disease (HCC) 10/11/2006   HSV 10/11/2006   RECTAL BLEEDING 10/11/2006   SYMPTOM, ABNORMAL LOSS OF WEIGHT 10/11/2006   HERPES ZOSTER 10/02/2006   CHLAMYDIA TRACHOMATIS, LOWER GU 10/02/2006   LIPODYSTROPHY 10/02/2006   PERIPHERAL NEUROPATHY 10/02/2006   History of cardiovascular disorder 10/02/2006   ARTHROSCOPY, KNEE, HX OF 10/02/2006   Past Medical History:  Diagnosis Date   Anemia    Colon polyps    adenomatous   CVA (cerebral vascular accident) (HCC) 2000   Headache    HIV positive (HCC) 1987   Hypercholesterolemia    Hyperlipidemia    Internal hemorrhoids    Shingles    Toxoplasmosis     Family History  Problem Relation Age of Onset   Heart attack Father    High Cholesterol Father    Breast cancer Mother 50       mets to lung and brain   Prostate cancer Brother    Prostate cancer Brother    Stroke Neg Hx    Migraines Neg Hx    Neuropathy Neg Hx    Colon cancer Neg Hx    Esophageal cancer Neg Hx    Pancreatic cancer Neg Hx    Stomach cancer Neg Hx    Liver disease Neg Hx     Past Surgical History:  Procedure Laterality Date   APPENDECTOMY  2001   KNEE ARTHROSCOPY Right    x 2   SHOULDER SURGERY Right    TONSILLECTOMY     ULNAR NERVE TRANSPOSITION Left 03/20/2017   Procedure: LEFT ELBOW ULNAR NERVE DECOMPRESSION;  Surgeon: Lyanne Sample, MD;  Location: [facility];  Service: Orthopedics;  Laterality: Left;   Social History   Occupational History    Employer: UNEMPLOYED  Tobacco Use   Smoking status: Never   Smokeless tobacco: Never  Substance and Sexual Activity   Alcohol use: No    Alcohol/week: 0.0 standard drinks of alcohol   Drug use: No   Sexual activity: Not Currently    Comment: declined condoms 06/14/20

## 2023-12-21 NOTE — Progress Notes (Signed)
 The ASCVD Risk score (Arnett DK, et al., 2019) failed to calculate for the following reasons:   Risk score cannot be calculated because patient has a medical history suggesting prior/existing ASCVD  Arlon Bergamo, BSN, RN

## 2024-03-05 ENCOUNTER — Encounter: Payer: Self-pay | Admitting: Plastic Surgery

## 2024-03-05 ENCOUNTER — Ambulatory Visit (INDEPENDENT_AMBULATORY_CARE_PROVIDER_SITE_OTHER): Admitting: Plastic Surgery

## 2024-03-05 VITALS — BP 120/74 | HR 71 | Ht 63.5 in | Wt 168.1 lb

## 2024-03-05 DIAGNOSIS — B20 Human immunodeficiency virus [HIV] disease: Secondary | ICD-10-CM | POA: Diagnosis not present

## 2024-03-05 DIAGNOSIS — E881 Lipodystrophy, not elsewhere classified: Secondary | ICD-10-CM

## 2024-03-05 DIAGNOSIS — M542 Cervicalgia: Secondary | ICD-10-CM | POA: Diagnosis not present

## 2024-03-05 DIAGNOSIS — M546 Pain in thoracic spine: Secondary | ICD-10-CM

## 2024-03-05 DIAGNOSIS — Z7902 Long term (current) use of antithrombotics/antiplatelets: Secondary | ICD-10-CM

## 2024-03-05 DIAGNOSIS — N62 Hypertrophy of breast: Secondary | ICD-10-CM | POA: Diagnosis not present

## 2024-03-05 DIAGNOSIS — Z8673 Personal history of transient ischemic attack (TIA), and cerebral infarction without residual deficits: Secondary | ICD-10-CM

## 2024-03-05 NOTE — Progress Notes (Signed)
 Referring Provider Marvine Rush, MD [address] [place],  [place] [address]   CC:  Chief Complaint  Patient presents with   Advice Only      Theresa Morrison is an 68 y.o. female.  HPI: Theresa Morrison is a 68 year old female who presents today with complaints of enlarged breasts which are causing discomfort.  She feels that the size of her breast has increased due to use of certain medications to treat infectious disease.  She is interested in reduction in the size of her breast.  The patient has a history of stroke as well as a history of heart palpitations.  She is currently on Plavix .  Allergies  Allergen Reactions   Penicillins     REACTION: facial swelling   Stavudine     REACTION: peripheral neuropathy   Sulfamethoxazole-Trimethoprim     REACTION: hives   Sulfa Antibiotics Rash    Outpatient Encounter Medications as of 03/05/2024  Medication Sig Note   abacavir -dolutegravir -lamiVUDine (TRIUMEQ ) 600-50-300 MG tablet Take 1 tablet by mouth daily.    atorvastatin  (LIPITOR) 10 MG tablet Take 10 mg by mouth daily.    calcium  carbonate (OS-CAL) 600 MG TABS Take 1,200 mg by mouth daily.     Cholecalciferol (VITAMIN D-3) 1000 units CAPS Take 1,000 Units by mouth daily.    clopidogrel  (PLAVIX ) 75 MG tablet TAKE 1 TABLET BY MOUTH DAILY 03/15/2017: Pt. Is to call her Medical doctor that prescribes the Plavix  to determine if she needs to stop it or not,.  She has HX of stroke in 2000.     faricimab-svoa (VABYSMO) 6 MG/0.05ML SOLN intravitreal injection 6 mg by Intravitreal route See admin instructions. Eye injection every 4 months in office 09/22/2023: Last administered January 2025   Multiple Vitamins-Minerals (MACULAR HEALTH FORMULA PO) Take 1 capsule by mouth daily.    meclizine (ANTIVERT) 25 MG tablet Take 25 mg by mouth every 6 (six) hours as needed. (Patient not taking: Reported on 03/05/2024)    No facility-administered encounter medications on file as of 03/05/2024.      Past Medical History:  Diagnosis Date   Anemia    Colon polyps    adenomatous   CVA (cerebral vascular accident) (HCC) 2000   Headache    HIV positive (HCC) 1987   Hypercholesterolemia    Hyperlipidemia    Internal hemorrhoids    Shingles    Toxoplasmosis     Past Surgical History:  Procedure Laterality Date   APPENDECTOMY  2001   KNEE ARTHROSCOPY Right    x 2   SHOULDER SURGERY Right    TONSILLECTOMY     ULNAR NERVE TRANSPOSITION Left 03/20/2017   Procedure: LEFT ELBOW ULNAR NERVE DECOMPRESSION;  Surgeon: Murrell Kuba, MD;  Location: [facility];  Service: Orthopedics;  Laterality: Left;    Family History  Problem Relation Age of Onset   Heart attack Father    High Cholesterol Father    Breast cancer Mother 50       mets to lung and brain   Prostate cancer Brother    Prostate cancer Brother    Stroke Neg Hx    Migraines Neg Hx    Neuropathy Neg Hx    Colon cancer Neg Hx    Esophageal cancer Neg Hx    Pancreatic cancer Neg Hx    Stomach cancer Neg Hx    Liver disease Neg Hx     Social History   Social History Narrative   Lives with  spouse and son   Caffeine use: 12oz daily     Review of Systems General: Denies fevers, chills, weight loss CV: Denies chest pain, shortness of breath, palpitations Breast: Large breasts which are causing upper back and neck pain.  Physical Exam    03/05/2024    3:17 PM 09/22/2023    4:10 PM 09/22/2023    4:01 PM  Vitals with BMI  Height 5' 3.5    Weight 168 lbs 2 oz    BMI 29.31    Systolic 120 109 94  Diastolic 74 64 63  Pulse 71  74    General:  No acute distress,  Alert and oriented, Non-Toxic, Normal speech and affect Breast: Patient has large pendulous breast with grade 2 ptosis.  There are no dominant masses on physical examination and the nipples are normal in appearance without evidence of nipple discharge the sternal notch to nipple distance on the right is 34 cm and 35 cm on the left the fold to  nipple distance on the right is 22 cm 22 cm on the left Mammogram: April 2025 BI-RADS 1 Assessment/Plan Symptomatic macromastia: Patient has moderately large breasts and would likely benefit from a breast reduction.  I believe I can remove 500 g per breast.  I showed her the location of the incisions and we discussed the unpredictable nature of scarring and wound healing.  We discussed the risks of bleeding, infection, seroma formation.  She understands the risk of bleeding is unacceptably high on Plavix  which she will need to stop for surgery.  She understands I will use drains postoperatively.  We discussed the risk of nipple loss due to nipple ischemia.  We discussed the postoperative limitations including no heavy lifting greater than 20 pounds, no vigorous activity, and no submerging the incisions underwater for 6 weeks.  She will be encouraged to begin ambulation immediately after surgery to help decrease the risk of DVT.  She may return to light activity as tolerated.  We will need clearance from her primary care provider to hold her Plavix  for 7 days prior to surgery and 2 days after surgery.  If she cannot stop her Plavix  we will be unable to proceed.  Will need clearance from her cardiologist.  All questions were answered to her satisfaction.  Photographs were obtained today with her consent.  Will submit her for bilateral breast reduction at her request.  HIV: Patient is nondetectable viral load by HIV RNA quantification for the past 4 years.  Theresa Morrison 03/05/2024, 5:27 PM

## 2024-03-06 ENCOUNTER — Telehealth: Payer: Self-pay | Admitting: *Deleted

## 2024-03-06 ENCOUNTER — Telehealth: Payer: Self-pay

## 2024-03-06 NOTE — Telephone Encounter (Signed)
   Name: SHEWANDA SHARPE  DOB: 05-04-1956  MRN: [id number]  Primary Cardiologist: None  Chart reviewed as part of pre-operative protocol coverage. Because of Eilyn Polack Pence's past medical history and time since last visit, she will require a follow-up in-office visit in order to better assess preoperative cardiovascular risk. Pt has not been seen since 2023.   Pre-op covering staff: - Please schedule appointment and call patient to inform them. If patient already had an upcoming appointment within acceptable timeframe, please add pre-op clearance to the appointment notes so provider is aware. - Please contact requesting surgeon's office via preferred method (i.e, phone, fax) to inform them of need for appointment prior to surgery.    Damien JAYSON Braver, NP  03/06/2024, 11:14 AM

## 2024-03-06 NOTE — Telephone Encounter (Signed)
 Patient has been scheduled for IN OFFICE visit

## 2024-03-06 NOTE — Telephone Encounter (Signed)
 Faxed surgical clearances to Dr. Gordy Bergamo (Cardiology) [phone] and Dr. Norleen General (PCP) [phone]. Confirmation of receipt.

## 2024-03-06 NOTE — Telephone Encounter (Signed)
   Pre-operative Risk Assessment    Patient Name: Theresa Morrison  DOB: June 13, 1956 MRN: [id number]   Date of last office visit: 12/01/21 DR. LADONA Date of next office visit: NONE   Request for Surgical Clearance    Procedure:  B/L BREAST REDUCTION  Date of Surgery:  Clearance TBD                                Surgeon:  DR. JACKSON TAYLOR Surgeon's Group or Practice Name:  [facility] Phone number:  [phone] Fax number:  [phone]   Type of Clearance Requested:   - Medical  - Pharmacy:  Hold Clopidogrel  (Plavix )     Type of Anesthesia:  General    Additional requests/questions:    Bonney Niels Jest   03/06/2024, 11:01 AM

## 2024-03-10 ENCOUNTER — Telehealth: Payer: Self-pay | Admitting: Plastic Surgery

## 2024-03-10 NOTE — Telephone Encounter (Signed)
 Patient would like to reschedule her surgery, please reach out

## 2024-03-21 ENCOUNTER — Ambulatory Visit (INDEPENDENT_AMBULATORY_CARE_PROVIDER_SITE_OTHER): Admitting: Family

## 2024-03-21 ENCOUNTER — Encounter (HOSPITAL_BASED_OUTPATIENT_CLINIC_OR_DEPARTMENT_OTHER): Payer: Self-pay | Admitting: Family

## 2024-03-21 VITALS — BP 108/54 | HR 79 | Ht 63.5 in | Wt 167.5 lb

## 2024-03-21 DIAGNOSIS — Z8673 Personal history of transient ischemic attack (TIA), and cerebral infarction without residual deficits: Secondary | ICD-10-CM | POA: Diagnosis not present

## 2024-03-21 DIAGNOSIS — Z01818 Encounter for other preprocedural examination: Secondary | ICD-10-CM

## 2024-03-21 DIAGNOSIS — E785 Hyperlipidemia, unspecified: Secondary | ICD-10-CM | POA: Diagnosis not present

## 2024-03-21 NOTE — Patient Instructions (Signed)
 Medication Instructions:   Your physician recommends that you continue on your current medications as directed. Please refer to the Current Medication list given to you today.  PER CAITLIN WALKER, NP--YOU MAY HOLD PLAVIX  5 DAYS PRIOR TO YOUR PROCEDURE AND RESUME BACK AT SURGEON'S DISCRETION  *If you need a refill on your cardiac medications before your next appointment, please call your pharmacy*    Follow-Up:  AS NEEDED WITH CAITLIN WALKER, NP   Other Instructions  PER CAITLIN WALKER, NP--YOU MAY HOLD PLAVIX  5 DAYS PRIOR TO YOUR PROCEDURE AND RESUME BACK AT [facility] DISCRETION

## 2024-03-21 NOTE — Progress Notes (Signed)
 Cardiology Office Note   Date:  03/21/2024  ID:  Theresa Morrison, Theresa Morrison 11-25-1955, MRN [id number] PCP: Marvine Rush, MD  [facility] Providers Cardiologist:  Gordy Bergamo, MD     History of Present Illness Theresa Morrison is a 68 y.o. female with history of hyperlipidemia, HIV, CVA 2000.   Established with Dr. Ganji for palpitations.  Echo 10/2021 LVEFEF 56%, trace MR, TR. monitor with predominantly normal sinus rhythm with rare PVC/PAC less than 1% burden with triggered episodes associated with PVC.  She was offered as needed propranolol but preferred to avoid medical therapy and was recommended for symptom monitoring.  Presents today for preoperative clearance for bilateral breast reduction with Dr. Leonce Birmingham.  There is request to hold Plavix . She has remote history of CVA with no residual and thought to be potentially related to her HIV medications at the time. Notes her dad had fatal MI at 24 year sold. She notes she has not had palpitations in some time. Occasional chest pain at rest with no clear aggravating factors. No exertional chest discomfort.Occasional L ankle swelling related to prior injury. No orthopnea, PND, exertional dyspnea. She reports she has been told she had abnormal EKG for some time.   ROS: Please see the history of present illness.    All other systems reviewed and are negative.   Studies Reviewed EKG Interpretation Date/Time:  Friday March 21 2024 13:41:52 EDT Ventricular Rate:  79 PR Interval:  160 QRS Duration:  90 QT Interval:  412 QTC Calculation: 472 R Axis:   -52  Text Interpretation: Normal sinus rhythm Left anterior fascicular block Stable flat lateral T waves. Anterior TWI similar to previous V1-V3. Poor R wave progression Confirmed by Vannie Mora ([id number]) on 03/21/2024 1:48:42 PM    Cardiac Studies & Procedures   ______________________________________________________________________________________________      ECHOCARDIOGRAM  PCV ECHOCARDIOGRAM COMPLETE 10/24/2021  Narrative Echocardiogram 10/24/2021: Left ventricle cavity is normal in size and wall thickness. Normal global wall motion. Normal LV systolic function with EF 56%. Normal diastolic filling pattern. Trace MR, TR. No evidence of pulmonary hypertension. Unlike previous study in 2015, mitral valve prolapse not appreciated on this study.    MONITORS  LONG TERM MONITOR (3-14 DAYS) 11/08/2021  Narrative Zio Patch Extended out patient EKG monitoring 7 days starting 2023: Dominant rhythm is normal sinus rhythm.  Minimum heart rate 46, maximum heart rate 141 bpm with average rate of 73 bpm.  There were rare PACs and PVCs.  There were rare ventricular trigeminy.  There were 5 diary entries correlated with PVCs and ventricular trigeminy.       ______________________________________________________________________________________________      Risk Assessment/Calculations           Physical Exam VS:  BP (!) 108/54   Pulse 79   Ht 5' 3.5 (1.613 m)   Wt 167 lb 8 oz (76 kg)   SpO2 95%   BMI 29.21 kg/m        Wt Readings from Last 3 Encounters:  03/21/24 167 lb 8 oz (76 kg)  03/05/24 168 lb 1.6 oz (76.2 kg)  07/24/23 160 lb 1.6 oz (72.6 kg)    GEN: Well nourished, well developed in no acute distress NECK: No JVD; No carotid bruits CARDIAC: RRR, no murmurs, rubs, gallops RESPIRATORY:  Clear to auscultation without rales, wheezing or rhonchi  ABDOMEN: Soft, non-tender, non-distended EXTREMITIES:  No edema; No deformity   ASSESSMENT AND PLAN  Preop - According to the Revised Cardiac  Risk Index (RCRI), her Perioperative Risk of Major Cardiac Event is (%): 0.4. Her Functional Capacity in METs is: 6.61 according to the Duke Activity Status Index (DASI). Per AHA/ACC guidelines, she is deemed acceptable risk for the planned procedure without additional cardiovascular testing. Will route to surgical team so they are aware. From cardiac  perspective, may hold Plavix  5 days prior to planned procedure. Due to hx of CVA 2000 would also recommend clearance from primary care provider regarding Plavix  hold.  HLD - continue Atorvastatin  10mg  daily per PCP. LDL goal <70 given history of CVA.  Abnormal EKG - stable anterior TWI and poor R wave progression. Reports longstanding history of abnormal EKG. Her EKG today is similar to one year ago, no anginal symptoms, no further workup recommended.   Family history of cardiovascular disease- No anginal symptoms. Discussed coronary calcium  score given family history. As she is asymptomatic, prefers to defer which is reasonable.         Dispo: follow up a needed with cardiology   Signed, Reche GORMAN Finder, NP

## 2024-04-03 ENCOUNTER — Encounter: Payer: Self-pay | Admitting: Plastic Surgery

## 2024-04-03 ENCOUNTER — Ambulatory Visit (INDEPENDENT_AMBULATORY_CARE_PROVIDER_SITE_OTHER): Admitting: Plastic Surgery

## 2024-04-03 VITALS — BP 111/67 | HR 58 | Ht 63.5 in | Wt 166.0 lb

## 2024-04-03 DIAGNOSIS — N62 Hypertrophy of breast: Secondary | ICD-10-CM

## 2024-04-03 MED ORDER — ONDANSETRON 4 MG PO TBDP: 4.0000 mg | ORAL_TABLET | Freq: Three times a day (TID) | ORAL | Status: AC | PRN

## 2024-04-03 MED ORDER — OXYCODONE-ACETAMINOPHEN 5-325 MG PO TABS: 1.0000 | ORAL_TABLET | Freq: Four times a day (QID) | ORAL | 0 refills | Status: AC | PRN

## 2024-04-03 NOTE — Progress Notes (Signed)
 Patient ID: Theresa Morrison, female    DOB: [birth year], 68 y.o.   MRN: [id number]  Chief Complaint  Patient presents with   Pre-op Exam    No diagnosis found.   History of Present Illness: Theresa Morrison is a 68 y.o.  female  with a history of symptomatic macromastia.  She presents for preoperative evaluation for upcoming procedure, bilateral breast reduction, scheduled for September 19 with Dr. Waddell.  The patient has not had problems with anesthesia.  Specifically she denies postoperative nausea and vomiting  Summary of Previous Visit: [name] is a 68 year old female who presents today with complaints of enlarged breasts which are causing discomfort. She feels that the size of her breast has increased due to use of certain medications to treat infectious disease. She is interested in reduction in the size of her breast. The patient has a history of stroke as well as a history of heart palpitations. She is currently on Plavix .   Preoperative clearance was sent to her cardiologist.  She is felt to be at low risk for major cardiac event and acceptable risk for a breast reduction.  Minimum weight to be removed is 500 g per breast   PMH Significant for: HIV with nondetectable viral load for 4 years, heart palpitations   Past Medical History: Allergies: Allergies  Allergen Reactions   Penicillins     REACTION: facial swelling   Stavudine     REACTION: peripheral neuropathy   Sulfamethoxazole-Trimethoprim     REACTION: hives   Sulfa Antibiotics Rash    Current Medications:  Current Outpatient Medications:    abacavir -dolutegravir -lamiVUDine (TRIUMEQ ) 600-50-300 MG tablet, Take 1 tablet by mouth daily., Disp: 90 tablet, Rfl: 3   atorvastatin  (LIPITOR) 10 MG tablet, Take 10 mg by mouth daily., Disp: , Rfl:    calcium  carbonate (OS-CAL) 600 MG TABS, Take 1,200 mg by mouth daily. , Disp: , Rfl:    Cholecalciferol (VITAMIN D-3) 1000 units CAPS, Take 1,000 Units by  mouth daily., Disp: , Rfl:    clopidogrel  (PLAVIX ) 75 MG tablet, TAKE 1 TABLET BY MOUTH DAILY, Disp: 90 tablet, Rfl: 1   faricimab-svoa (VABYSMO) 6 MG/0.05ML SOLN intravitreal injection, 6 mg by Intravitreal route See admin instructions. Eye injection every 4 months in office, Disp: , Rfl:    Multiple Vitamins-Minerals (MACULAR HEALTH FORMULA PO), Take 1 capsule by mouth daily., Disp: , Rfl:   Past Medical Problems: Past Medical History:  Diagnosis Date   Anemia    Colon polyps    adenomatous   CVA (cerebral vascular accident) (HCC) 2000   Headache    HIV positive (HCC) 1987   Hypercholesterolemia    Hyperlipidemia    Internal hemorrhoids    Shingles    Toxoplasmosis     Past Surgical History: Past Surgical History:  Procedure Laterality Date   APPENDECTOMY  2001   KNEE ARTHROSCOPY Right    x 2   SHOULDER SURGERY Right    TONSILLECTOMY     ULNAR NERVE TRANSPOSITION Left 03/20/2017   Procedure: LEFT ELBOW ULNAR NERVE DECOMPRESSION;  Surgeon: Murrell Kuba, MD;  Location: [facility];  Service: Orthopedics;  Laterality: Left;    Social History: Social History   Socioeconomic History   Marital status: Married    Spouse name: Franky   Number of children: 2   Years of education: 12   Highest education level: Not on file  Occupational History    Employer: UNEMPLOYED  Tobacco Use  Smoking status: Never   Smokeless tobacco: Never  Substance and Sexual Activity   Alcohol use: No    Alcohol/week: 0.0 standard drinks of alcohol   Drug use: No   Sexual activity: Not Currently    Comment: declined condoms 06/14/20  Other Topics Concern   Not on file  Social History Narrative   Lives with spouse and son   Caffeine use: 12oz daily   Social Drivers of Corporate investment banker Strain: Not on file  Food Insecurity: No Food Insecurity (12/11/2022)   Hunger Vital Sign    Worried About Running Out of Food in the Last Year: Never true    Ran Out of Food in the  Last Year: Never true  Transportation Needs: No Transportation Needs (12/11/2022)   PRAPARE - Administrator, Civil Service (Medical): No    Lack of Transportation (Non-Medical): No  Physical Activity: Not on file  Stress: Not on file  Social Connections: Not on file  Intimate Partner Violence: Not At Risk (12/11/2022)   Humiliation, Afraid, Rape, and Kick questionnaire    Fear of Current or Ex-Partner: No    Emotionally Abused: No    Physically Abused: No    Sexually Abused: No    Family History: Family History  Problem Relation Age of Onset   Heart attack Father    High Cholesterol Father    Breast cancer Mother 50       mets to lung and brain   Prostate cancer Brother    Prostate cancer Brother    Stroke Neg Hx    Migraines Neg Hx    Neuropathy Neg Hx    Colon cancer Neg Hx    Esophageal cancer Neg Hx    Pancreatic cancer Neg Hx    Stomach cancer Neg Hx    Liver disease Neg Hx     Review of Systems: No specific complaints today. Upper back and neck pain No chest pain no palpitations  Physical Exam: Vital Signs BP 111/67 (BP Location: Left Arm, Patient Position: Sitting, Cuff Size: Normal)   Pulse (!) 58   Ht 5' 3.5 (1.613 m)   Wt 166 lb (75.3 kg)   SpO2 97%   BMI 28.94 kg/m   Physical Exam unchanged Constitutional:      General: Not in acute distress.    Appearance: Normal appearance. Not ill-appearing.  HENT:     Head: Normocephalic and atraumatic.  Eyes:     Pupils: Pupils are equal, round. Cardiovascular:     Rate and Rhythm: Normal rate.    Pulses: Normal pulses.  Pulmonary:     Effort: No respiratory distress or increased work of breathing.  Speaks in full sentences. Abdominal:     General: Abdomen is flat. No distension.   Musculoskeletal: Normal range of motion. No lower extremity swelling or edema. No varicosities.   Breasts:Patient has large pendulous breast with grade 2 ptosis. There are no dominant masses on physical  examination and the nipples are normal in appearance without evidence of nipple discharge the sternal notch to nipple distance on the right is 34 cm and 35 cm on the left the fold to nipple distance on the right is 22 cm 22 cm on the lef  Skin:    General: Skin is warm and dry.     Findings: No erythema or rash.  Neurological:     Mental Status: Alert and oriented to person, place, and time.  Psychiatric:  Mood and Affect: Mood normal.        Behavior: Behavior normal.    Assessment/Plan: The patient is scheduled for a bilateral breast reduction with Dr. Waddell.  Risks, benefits, and alternatives of procedure discussed, questions answered and consent obtained.    Smoking Status: Does not smoke;  Last Mammogram: April 2025; Results: BI-RADS 1  Caprini Score: 6; Risk Factors include: Age, surgery greater than 45 minutes, BMI greater than 25, and length of planned surgery. Recommendation for mechanical but no pharmacological prophylaxis. Encourage early ambulation.   Pictures obtained: Yes  Post-op Rx sent to pharmacy: Oxycodone  and Zofran   Patient was provided with the  General Surgical Risk consent document and Pain Medication Agreement prior to their appointment.  They had adequate time to read through the risk consent documents and Pain Medication Agreement. We also discussed them in person together during this preop appointment. All of their questions were answered to their satisfaction.  Recommended calling if they have any further questions.  Risk consent form and Pain Medication Agreement to be scanned into patient's chart.  I discussed again the risks of bleeding, infection, asymmetry, loss of nipple due to ischemia, difficulty with mammograms in the future, difficulty with wound healing including the need for minor wound care.  We discussed the postoperative limitations including no heavy lifting greater than 20 pounds, no vigorous activity, no submerging incisions in water.  We  discussed the importance of early ambulation.  All questions were answered to her satisfaction.  She will stop her Plavix  on the Sunday prior to her surgery on Friday.  Will proceed with bilateral breast reduction at her request.   Electronically signed by: Leonce KATHEE Waddell, MD 04/03/2024 9:19 AM

## 2024-04-03 NOTE — H&P (View-Only) (Signed)
 Patient ID: Theresa Morrison, female    DOB: [birth year], 68 y.o.   MRN: [id number]  Chief Complaint  Patient presents with   Pre-op Exam    No diagnosis found.   History of Present Illness: Theresa Morrison is a 68 y.o.  female  with a history of symptomatic macromastia.  She presents for preoperative evaluation for upcoming procedure, bilateral breast reduction, scheduled for September 19 with Dr. Waddell.  The patient has not had problems with anesthesia.  Specifically she denies postoperative nausea and vomiting  Summary of Previous Visit: Theresa Morrison is a 68 year old female who presents today with complaints of enlarged breasts which are causing discomfort. She feels that the size of her breast has increased due to use of certain medications to treat infectious disease. She is interested in reduction in the size of her breast. The patient has a history of stroke as well as a history of heart palpitations. She is currently on Plavix .   Preoperative clearance was sent to her cardiologist.  She is felt to be at low risk for major cardiac event and acceptable risk for a breast reduction.  Minimum weight to be removed is 500 g per breast   PMH Significant for: HIV with nondetectable viral load for 4 years, heart palpitations   Past Medical History: Allergies: Allergies  Allergen Reactions   Penicillins     REACTION: facial swelling   Stavudine     REACTION: peripheral neuropathy   Sulfamethoxazole-Trimethoprim     REACTION: hives   Sulfa Antibiotics Rash    Current Medications:  Current Outpatient Medications:    abacavir -dolutegravir -lamiVUDine (TRIUMEQ ) 600-50-300 MG tablet, Take 1 tablet by mouth daily., Disp: 90 tablet, Rfl: 3   atorvastatin  (LIPITOR) 10 MG tablet, Take 10 mg by mouth daily., Disp: , Rfl:    calcium  carbonate (OS-CAL) 600 MG TABS, Take 1,200 mg by mouth daily. , Disp: , Rfl:    Cholecalciferol (VITAMIN D-3) 1000 units CAPS, Take 1,000 Units by  mouth daily., Disp: , Rfl:    clopidogrel  (PLAVIX ) 75 MG tablet, TAKE 1 TABLET BY MOUTH DAILY, Disp: 90 tablet, Rfl: 1   faricimab-svoa (VABYSMO) 6 MG/0.05ML SOLN intravitreal injection, 6 mg by Intravitreal route See admin instructions. Eye injection every 4 months in office, Disp: , Rfl:    Multiple Vitamins-Minerals (MACULAR HEALTH FORMULA PO), Take 1 capsule by mouth daily., Disp: , Rfl:   Past Medical Problems: Past Medical History:  Diagnosis Date   Anemia    Colon polyps    adenomatous   CVA (cerebral vascular accident) (HCC) 2000   Headache    HIV positive (HCC) 1987   Hypercholesterolemia    Hyperlipidemia    Internal hemorrhoids    Shingles    Toxoplasmosis     Past Surgical History: Past Surgical History:  Procedure Laterality Date   APPENDECTOMY  2001   KNEE ARTHROSCOPY Right    x 2   SHOULDER SURGERY Right    TONSILLECTOMY     ULNAR NERVE TRANSPOSITION Left 03/20/2017   Procedure: LEFT ELBOW ULNAR NERVE DECOMPRESSION;  Surgeon: Murrell Kuba, MD;  Location: [facility];  Service: Orthopedics;  Laterality: Left;    Social History: Social History   Socioeconomic History   Marital status: Married    Spouse name: Franky   Number of children: 2   Years of education: 12   Highest education level: Not on file  Occupational History    Employer: UNEMPLOYED  Tobacco Use  Smoking status: Never   Smokeless tobacco: Never  Substance and Sexual Activity   Alcohol use: No    Alcohol/week: 0.0 standard drinks of alcohol   Drug use: No   Sexual activity: Not Currently    Comment: declined condoms 06/14/20  Other Topics Concern   Not on file  Social History Narrative   Lives with spouse and son   Caffeine use: 12oz daily   Social Drivers of Corporate investment banker Strain: Not on file  Food Insecurity: No Food Insecurity (12/11/2022)   Hunger Vital Sign    Worried About Running Out of Food in the Last Year: Never true    Ran Out of Food in the  Last Year: Never true  Transportation Needs: No Transportation Needs (12/11/2022)   PRAPARE - Administrator, Civil Service (Medical): No    Lack of Transportation (Non-Medical): No  Physical Activity: Not on file  Stress: Not on file  Social Connections: Not on file  Intimate Partner Violence: Not At Risk (12/11/2022)   Humiliation, Afraid, Rape, and Kick questionnaire    Fear of Current or Ex-Partner: No    Emotionally Abused: No    Physically Abused: No    Sexually Abused: No    Family History: Family History  Problem Relation Age of Onset   Heart attack Father    High Cholesterol Father    Breast cancer Mother 50       mets to lung and brain   Prostate cancer Brother    Prostate cancer Brother    Stroke Neg Hx    Migraines Neg Hx    Neuropathy Neg Hx    Colon cancer Neg Hx    Esophageal cancer Neg Hx    Pancreatic cancer Neg Hx    Stomach cancer Neg Hx    Liver disease Neg Hx     Review of Systems: No specific complaints today. Upper back and neck pain No chest pain no palpitations  Physical Exam: Vital Signs BP 111/67 (BP Location: Left Arm, Patient Position: Sitting, Cuff Size: Normal)   Pulse (!) 58   Ht 5' 3.5 (1.613 m)   Wt 166 lb (75.3 kg)   SpO2 97%   BMI 28.94 kg/m   Physical Exam unchanged Constitutional:      General: Not in acute distress.    Appearance: Normal appearance. Not ill-appearing.  HENT:     Head: Normocephalic and atraumatic.  Eyes:     Pupils: Pupils are equal, round. Cardiovascular:     Rate and Rhythm: Normal rate.    Pulses: Normal pulses.  Pulmonary:     Effort: No respiratory distress or increased work of breathing.  Speaks in full sentences. Abdominal:     General: Abdomen is flat. No distension.   Musculoskeletal: Normal range of motion. No lower extremity swelling or edema. No varicosities.   Breasts:Patient has large pendulous breast with grade 2 ptosis. There are no dominant masses on physical  examination and the nipples are normal in appearance without evidence of nipple discharge the sternal notch to nipple distance on the right is 34 cm and 35 cm on the left the fold to nipple distance on the right is 22 cm 22 cm on the lef  Skin:    General: Skin is warm and dry.     Findings: No erythema or rash.  Neurological:     Mental Status: Alert and oriented to person, place, and time.  Psychiatric:  Mood and Affect: Mood normal.        Behavior: Behavior normal.    Assessment/Plan: The patient is scheduled for a bilateral breast reduction with Dr. Waddell.  Risks, benefits, and alternatives of procedure discussed, questions answered and consent obtained.    Smoking Status: Does not smoke;  Last Mammogram: April 2025; Results: BI-RADS 1  Caprini Score: 6; Risk Factors include: Age, surgery greater than 45 minutes, BMI greater than 25, and length of planned surgery. Recommendation for mechanical but no pharmacological prophylaxis. Encourage early ambulation.   Pictures obtained: Yes  Post-op Rx sent to pharmacy: Oxycodone  and Zofran   Patient was provided with the  General Surgical Risk consent document and Pain Medication Agreement prior to their appointment.  They had adequate time to read through the risk consent documents and Pain Medication Agreement. We also discussed them in person together during this preop appointment. All of their questions were answered to their satisfaction.  Recommended calling if they have any further questions.  Risk consent form and Pain Medication Agreement to be scanned into patient's chart.  I discussed again the risks of bleeding, infection, asymmetry, loss of nipple due to ischemia, difficulty with mammograms in the future, difficulty with wound healing including the need for minor wound care.  We discussed the postoperative limitations including no heavy lifting greater than 20 pounds, no vigorous activity, no submerging incisions in water.  We  discussed the importance of early ambulation.  All questions were answered to her satisfaction.  She will stop her Plavix  on the Sunday prior to her surgery on Friday.  Will proceed with bilateral breast reduction at her request.   Electronically signed by: Leonce KATHEE Waddell, MD 04/03/2024 9:19 AM

## 2024-04-07 ENCOUNTER — Encounter (HOSPITAL_BASED_OUTPATIENT_CLINIC_OR_DEPARTMENT_OTHER): Payer: Self-pay | Admitting: Plastic Surgery

## 2024-04-07 ENCOUNTER — Other Ambulatory Visit: Payer: Self-pay

## 2024-04-10 MED ORDER — CHLORHEXIDINE GLUCONATE CLOTH 2 % EX PADS: 6.0000 | MEDICATED_PAD | Freq: Once | CUTANEOUS | Status: DC

## 2024-04-10 NOTE — Progress Notes (Signed)

## 2024-04-11 ENCOUNTER — Other Ambulatory Visit: Payer: Self-pay

## 2024-04-11 ENCOUNTER — Ambulatory Visit (HOSPITAL_BASED_OUTPATIENT_CLINIC_OR_DEPARTMENT_OTHER): Admitting: Anesthesiology

## 2024-04-11 ENCOUNTER — Ambulatory Visit (HOSPITAL_BASED_OUTPATIENT_CLINIC_OR_DEPARTMENT_OTHER)
Admission: RE | Admit: 2024-04-11 | Discharge: 2024-04-11 | Disposition: A | Discharge status: Home / Self Care | Attending: Plastic Surgery | Admitting: Plastic Surgery

## 2024-04-11 ENCOUNTER — Encounter (HOSPITAL_BASED_OUTPATIENT_CLINIC_OR_DEPARTMENT_OTHER): Payer: Self-pay | Admitting: Plastic Surgery

## 2024-04-11 ENCOUNTER — Encounter (HOSPITAL_BASED_OUTPATIENT_CLINIC_OR_DEPARTMENT_OTHER)
Admission: RE | Disposition: A | Discharge status: Home / Self Care | Payer: Self-pay | Source: Home / Self Care | Attending: Plastic Surgery

## 2024-04-11 DIAGNOSIS — Z7902 Long term (current) use of antithrombotics/antiplatelets: Secondary | ICD-10-CM | POA: Insufficient documentation

## 2024-04-11 DIAGNOSIS — N62 Hypertrophy of breast: Secondary | ICD-10-CM | POA: Diagnosis present

## 2024-04-11 DIAGNOSIS — M549 Dorsalgia, unspecified: Secondary | ICD-10-CM | POA: Diagnosis not present

## 2024-04-11 DIAGNOSIS — Z21 Asymptomatic human immunodeficiency virus [HIV] infection status: Secondary | ICD-10-CM | POA: Diagnosis not present

## 2024-04-11 DIAGNOSIS — M546 Pain in thoracic spine: Secondary | ICD-10-CM | POA: Diagnosis not present

## 2024-04-11 DIAGNOSIS — F32A Depression, unspecified: Secondary | ICD-10-CM

## 2024-04-11 DIAGNOSIS — N189 Chronic kidney disease, unspecified: Secondary | ICD-10-CM | POA: Diagnosis not present

## 2024-04-11 DIAGNOSIS — N6022 Fibroadenosis of left breast: Secondary | ICD-10-CM | POA: Diagnosis not present

## 2024-04-11 DIAGNOSIS — M542 Cervicalgia: Secondary | ICD-10-CM | POA: Diagnosis not present

## 2024-04-11 DIAGNOSIS — E7800 Pure hypercholesterolemia, unspecified: Secondary | ICD-10-CM | POA: Diagnosis not present

## 2024-04-11 DIAGNOSIS — I639 Cerebral infarction, unspecified: Secondary | ICD-10-CM

## 2024-04-11 DIAGNOSIS — N6021 Fibroadenosis of right breast: Secondary | ICD-10-CM | POA: Diagnosis not present

## 2024-04-11 DIAGNOSIS — R920 Mammographic microcalcification found on diagnostic imaging of breast: Secondary | ICD-10-CM | POA: Insufficient documentation

## 2024-04-11 DIAGNOSIS — Z01818 Encounter for other preprocedural examination: Secondary | ICD-10-CM

## 2024-04-11 HISTORY — DX: Autoimmune hemolytic anemia, unspecified: D59.10

## 2024-04-11 HISTORY — PX: BREAST REDUCTION SURGERY: SHX8

## 2024-04-11 SURGERY — MAMMOPLASTY, REDUCTION
Anesthesia: General | Site: Breast | Laterality: Bilateral

## 2024-04-11 MED ORDER — MIDAZOLAM HCL 2 MG/2ML IJ SOLN: 0.5000 mg | Freq: Once | INTRAMUSCULAR | Status: DC | PRN

## 2024-04-11 MED ORDER — EPHEDRINE 5 MG/ML INJ
INTRAVENOUS | Status: AC
  Filled 2024-04-11: qty 5

## 2024-04-11 MED ORDER — CLINDAMYCIN PHOSPHATE 900 MG/50ML IV SOLN
INTRAVENOUS | Status: DC | PRN
  Administered 2024-04-11: 900 mg via INTRAVENOUS

## 2024-04-11 MED ORDER — LIDOCAINE 2% (20 MG/ML) 5 ML SYRINGE
INTRAMUSCULAR | Status: AC
  Filled 2024-04-11: qty 5

## 2024-04-11 MED ORDER — SUGAMMADEX SODIUM 200 MG/2ML IV SOLN
INTRAVENOUS | Status: DC | PRN
  Administered 2024-04-11: 200 mg via INTRAVENOUS

## 2024-04-11 MED ORDER — MIDAZOLAM HCL 2 MG/2ML IJ SOLN
INTRAMUSCULAR | Status: AC
  Filled 2024-04-11: qty 2

## 2024-04-11 MED ORDER — FENTANYL CITRATE (PF) 100 MCG/2ML IJ SOLN
INTRAMUSCULAR | Status: AC
  Filled 2024-04-11: qty 2

## 2024-04-11 MED ORDER — EPHEDRINE 5 MG/ML INJ
INTRAVENOUS | Status: AC
  Filled 2024-04-11: qty 15

## 2024-04-11 MED ORDER — PHENYLEPHRINE 80 MCG/ML (10ML) SYRINGE FOR IV PUSH (FOR BLOOD PRESSURE SUPPORT)
PREFILLED_SYRINGE | INTRAVENOUS | Status: AC
Start: 2024-04-11 — End: 2024-04-11
  Filled 2024-04-11: qty 10

## 2024-04-11 MED ORDER — PHENYLEPHRINE 80 MCG/ML (10ML) SYRINGE FOR IV PUSH (FOR BLOOD PRESSURE SUPPORT)
PREFILLED_SYRINGE | INTRAVENOUS | Status: AC
  Filled 2024-04-11: qty 20

## 2024-04-11 MED ORDER — CLINDAMYCIN PHOSPHATE 900 MG/50ML IV SOLN: 900.0000 mg | INTRAVENOUS | Status: DC

## 2024-04-11 MED ORDER — ROCURONIUM BROMIDE 10 MG/ML (PF) SYRINGE
PREFILLED_SYRINGE | INTRAVENOUS | Status: AC
  Filled 2024-04-11: qty 10

## 2024-04-11 MED ORDER — DEXAMETHASONE SODIUM PHOSPHATE 10 MG/ML IJ SOLN
INTRAMUSCULAR | Status: AC
  Filled 2024-04-11: qty 3

## 2024-04-11 MED ORDER — HYDROMORPHONE HCL 1 MG/ML IJ SOLN
INTRAMUSCULAR | Status: AC
  Filled 2024-04-11: qty 0.5

## 2024-04-11 MED ORDER — ACETAMINOPHEN 500 MG PO TABS
1000.0000 mg | ORAL_TABLET | Freq: Once | ORAL | Status: AC
  Administered 2024-04-11: 1000 mg via ORAL

## 2024-04-11 MED ORDER — FENTANYL CITRATE (PF) 100 MCG/2ML IJ SOLN
INTRAMUSCULAR | Status: DC | PRN
  Administered 2024-04-11: 100 ug via INTRAVENOUS

## 2024-04-11 MED ORDER — MEPERIDINE HCL 25 MG/ML IJ SOLN: 6.2500 mg | INTRAMUSCULAR | Status: DC | PRN

## 2024-04-11 MED ORDER — LIDOCAINE HCL (CARDIAC) PF 100 MG/5ML IV SOSY
PREFILLED_SYRINGE | INTRAVENOUS | Status: DC | PRN
  Administered 2024-04-11: 20 mg via INTRAVENOUS

## 2024-04-11 MED ORDER — HYDROMORPHONE HCL 1 MG/ML IJ SOLN
0.2500 mg | INTRAMUSCULAR | Status: DC | PRN
  Administered 2024-04-11: .5 mg via INTRAVENOUS
  Administered 2024-04-11: 0.5 mg via INTRAVENOUS

## 2024-04-11 MED ORDER — EPHEDRINE SULFATE (PRESSORS) 50 MG/ML IJ SOLN
INTRAMUSCULAR | Status: DC | PRN
  Administered 2024-04-11 (×2): 5 mg via INTRAVENOUS

## 2024-04-11 MED ORDER — SODIUM CHLORIDE (PF) 0.9 % IJ SOLN
INTRAMUSCULAR | Status: DC | PRN
  Administered 2024-04-11: 100 mL

## 2024-04-11 MED ORDER — DEXAMETHASONE SODIUM PHOSPHATE 4 MG/ML IJ SOLN
INTRAMUSCULAR | Status: DC | PRN
  Administered 2024-04-11: 5 mg via INTRAVENOUS

## 2024-04-11 MED ORDER — LACTATED RINGERS IV SOLN: INTRAVENOUS | Status: DC

## 2024-04-11 MED ORDER — NITROGLYCERIN 2 % TD OINT
TOPICAL_OINTMENT | TRANSDERMAL | Status: AC
  Filled 2024-04-11: qty 30

## 2024-04-11 MED ORDER — PROPOFOL 500 MG/50ML IV EMUL
INTRAVENOUS | Status: DC | PRN
  Administered 2024-04-11: 30 ug/kg/min via INTRAVENOUS

## 2024-04-11 MED ORDER — LACTATED RINGERS IV SOLN: INTRAVENOUS | Status: DC | PRN

## 2024-04-11 MED ORDER — SCOPOLAMINE 1 MG/3DAYS TD PT72
MEDICATED_PATCH | TRANSDERMAL | Status: AC
  Filled 2024-04-11: qty 1

## 2024-04-11 MED ORDER — OXYCODONE HCL 5 MG PO TABS
ORAL_TABLET | ORAL | Status: AC
  Filled 2024-04-11: qty 1

## 2024-04-11 MED ORDER — NITROGLYCERIN 2 % TD OINT
TOPICAL_OINTMENT | TRANSDERMAL | Status: DC | PRN
  Administered 2024-04-11: .5 [in_us] via TOPICAL

## 2024-04-11 MED ORDER — PHENYLEPHRINE HCL (PRESSORS) 10 MG/ML IV SOLN
INTRAVENOUS | Status: DC | PRN
  Administered 2024-04-11 (×5): 80 ug via INTRAVENOUS
  Administered 2024-04-11: 160 ug via INTRAVENOUS
  Administered 2024-04-11 (×2): 80 ug via INTRAVENOUS

## 2024-04-11 MED ORDER — OXYCODONE HCL 5 MG PO TABS
5.0000 mg | ORAL_TABLET | Freq: Once | ORAL | Status: AC | PRN
  Administered 2024-04-11: 5 mg via ORAL

## 2024-04-11 MED ORDER — CLINDAMYCIN PHOSPHATE 900 MG/50ML IV SOLN
INTRAVENOUS | Status: AC
  Filled 2024-04-11: qty 50

## 2024-04-11 MED ORDER — ONDANSETRON HCL 4 MG/2ML IJ SOLN
INTRAMUSCULAR | Status: DC | PRN
  Administered 2024-04-11: 4 mg via INTRAVENOUS

## 2024-04-11 MED ORDER — PROPOFOL 500 MG/50ML IV EMUL
INTRAVENOUS | Status: AC
  Filled 2024-04-11: qty 50

## 2024-04-11 MED ORDER — 0.9 % SODIUM CHLORIDE (POUR BTL) OPTIME
TOPICAL | Status: DC | PRN
  Administered 2024-04-11 (×3): 1000 mL

## 2024-04-11 MED ORDER — OXYCODONE HCL 5 MG/5ML PO SOLN: 5.0000 mg | Freq: Once | ORAL | Status: AC | PRN

## 2024-04-11 MED ORDER — ONDANSETRON HCL 4 MG/2ML IJ SOLN
INTRAMUSCULAR | Status: AC
  Filled 2024-04-11: qty 4

## 2024-04-11 MED ORDER — ROCURONIUM BROMIDE 100 MG/10ML IV SOLN
INTRAVENOUS | Status: DC | PRN
  Administered 2024-04-11: 50 mg via INTRAVENOUS

## 2024-04-11 MED ORDER — SCOPOLAMINE 1 MG/3DAYS TD PT72
1.0000 | MEDICATED_PATCH | TRANSDERMAL | Status: DC
  Administered 2024-04-11: 1 mg via TRANSDERMAL

## 2024-04-11 MED ORDER — KETOROLAC TROMETHAMINE 30 MG/ML IJ SOLN
INTRAMUSCULAR | Status: AC
  Filled 2024-04-11: qty 1

## 2024-04-11 MED ORDER — ACETAMINOPHEN 500 MG PO TABS
ORAL_TABLET | ORAL | Status: AC
  Filled 2024-04-11: qty 2

## 2024-04-11 MED ORDER — PROPOFOL 10 MG/ML IV BOLUS
INTRAVENOUS | Status: DC | PRN
  Administered 2024-04-11: 140 mg via INTRAVENOUS

## 2024-04-11 MED ORDER — MIDAZOLAM HCL 2 MG/2ML IJ SOLN
INTRAMUSCULAR | Status: DC | PRN
  Administered 2024-04-11: 2 mg via INTRAVENOUS

## 2024-04-11 SURGICAL SUPPLY — 60 items
BINDER BREAST 3XL (GAUZE/BANDAGES/DRESSINGS) IMPLANT
BINDER BREAST LRG (GAUZE/BANDAGES/DRESSINGS) IMPLANT
BINDER BREAST MEDIUM (GAUZE/BANDAGES/DRESSINGS) IMPLANT
BINDER BREAST XLRG (GAUZE/BANDAGES/DRESSINGS) IMPLANT
BINDER BREAST XXLRG (GAUZE/BANDAGES/DRESSINGS) IMPLANT
BIOPATCH RED 1 DISK 7.0 (GAUZE/BANDAGES/DRESSINGS) ×2 IMPLANT
BLADE HEX COATED 2.75 (ELECTRODE) IMPLANT
BLADE SURG 10 STRL SS (BLADE) ×6 IMPLANT
BLADE SURG 15 STRL LF DISP TIS (BLADE) ×1 IMPLANT
CANISTER SUCT 1200ML W/VALVE (MISCELLANEOUS) IMPLANT
COTTONBALL LRG STERILE PKG (GAUZE/BANDAGES/DRESSINGS) IMPLANT
DERMABOND ADVANCED .7 DNX12 (GAUZE/BANDAGES/DRESSINGS) ×4 IMPLANT
DRAIN CHANNEL 19F RND (DRAIN) ×2 IMPLANT
DRAPE IMP U-DRAPE 54X76 (DRAPES) IMPLANT
DRAPE UTILITY XL STRL (DRAPES) ×1 IMPLANT
DRSG TEGADERM 4X4.75 (GAUZE/BANDAGES/DRESSINGS) ×2 IMPLANT
DRSG TELFA 3X8 NADH STRL (GAUZE/BANDAGES/DRESSINGS) IMPLANT
ELECTRODE BLDE 4.0 EZ CLN MEGD (MISCELLANEOUS) ×1 IMPLANT
ELECTRODE REM PT RTRN 9FT ADLT (ELECTROSURGICAL) ×2 IMPLANT
EVACUATOR SILICONE 100CC (DRAIN) ×2 IMPLANT
GAUZE PAD ABD 8X10 STRL (GAUZE/BANDAGES/DRESSINGS) ×2 IMPLANT
GAUZE SPONGE 2X2 STRL 8-PLY (GAUZE/BANDAGES/DRESSINGS) ×2 IMPLANT
GAUZE XEROFORM 5X9 LF (GAUZE/BANDAGES/DRESSINGS) IMPLANT
GLOVE BIO SURGEON STRL SZ 6.5 (GLOVE) IMPLANT
GLOVE BIO SURGEON STRL SZ7.5 (GLOVE) IMPLANT
GLOVE BIO SURGEON STRL SZ8 (GLOVE) ×1 IMPLANT
GLOVE BIOGEL PI IND STRL 7.0 (GLOVE) IMPLANT
GLOVE BIOGEL PI IND STRL 8 (GLOVE) ×1 IMPLANT
GOWN STRL REUS W/ TWL LRG LVL3 (GOWN DISPOSABLE) ×1 IMPLANT
GOWN STRL REUS W/TWL XL LVL3 (GOWN DISPOSABLE) ×1 IMPLANT
HEMOSTAT ARISTA ABSORB 3G PWDR (HEMOSTASIS) IMPLANT
HIBICLENS CHG 4% 4OZ BTL (MISCELLANEOUS) ×1 IMPLANT
MANIFOLD NEPTUNE II (INSTRUMENTS) ×1 IMPLANT
MARKER SKIN DUAL TIP RULER LAB (MISCELLANEOUS) ×1 IMPLANT
NDL HYPO 22X1.5 SAFETY MO (MISCELLANEOUS) ×2 IMPLANT
NEEDLE HYPO 22X1.5 SAFETY MO (MISCELLANEOUS) ×2 IMPLANT
NS IRRIG 1000ML POUR BTL (IV SOLUTION) ×1 IMPLANT
PACK BASIN DAY SURGERY FS (CUSTOM PROCEDURE TRAY) ×1 IMPLANT
PACK UNIVERSAL I (CUSTOM PROCEDURE TRAY) ×1 IMPLANT
PENCIL SMOKE EVACUATOR (MISCELLANEOUS) ×2 IMPLANT
PIN SAFETY STERILE (MISCELLANEOUS) ×1 IMPLANT
SLEEVE SCD COMPRESS KNEE MED (STOCKING) ×1 IMPLANT
SPONGE T-LAP 18X18 ~~LOC~~+RFID (SPONGE) ×3 IMPLANT
STAPLER SKIN PROX WIDE 3.9 (STAPLE) ×1 IMPLANT
STRIP CLOSURE SKIN 1/2X4 (GAUZE/BANDAGES/DRESSINGS) IMPLANT
SUT MNCRL AB 3-0 PS2 27 (SUTURE) ×4 IMPLANT
SUT MNCRL AB 4-0 PS2 18 (SUTURE) ×4 IMPLANT
SUT MON AB 2-0 CT1 36 (SUTURE) ×1 IMPLANT
SUT MON AB 5-0 PS2 18 (SUTURE) IMPLANT
SUT SILK 2 0 SH (SUTURE) ×2 IMPLANT
SUT VIC AB 3-0 SH 27X BRD (SUTURE) IMPLANT
SYR 20ML LL LF (SYRINGE) ×2 IMPLANT
SYR BULB IRRIG 60ML STRL (SYRINGE) IMPLANT
SYR CONTROL 10ML LL (SYRINGE) ×1 IMPLANT
TAPE MEASURE VINYL STERILE (MISCELLANEOUS) IMPLANT
TOWEL GREEN STERILE FF (TOWEL DISPOSABLE) ×2 IMPLANT
TRAY DSU PREP LF (CUSTOM PROCEDURE TRAY) ×1 IMPLANT
TUBE CONNECTING 20X1/4 (TUBING) ×1 IMPLANT
UNDERPAD 30X36 HEAVY ABSORB (UNDERPADS AND DIAPERS) ×2 IMPLANT
YANKAUER SUCT BULB TIP NO VENT (SUCTIONS) ×1 IMPLANT

## 2024-04-11 NOTE — Op Note (Signed)
 DATE OF OPERATION: 04/11/2024   LOCATION: [facility] operating Room   PREOPERATIVE DIAGNOSIS: Symptomatic macromastia   POSTOPERATIVE DIAGNOSIS: Same   PROCEDURE: Bilateral breast reduction   SURGEON: Marinell Birmingham, MD   ASSISTANT: Estefana Peck   EBL: 100 cc   CONDITION: Stable   COMPLICATIONS: None   INDICATION: The patient, Theresa Morrison, is a 68 y.o. female born on 1956-01-13, is here for treatment upper back and neck pain secondary to large breast size.     PROCEDURE DETAILS:  The patient was seen prior to surgery and marked.  IV antibiotics were given. The patient was taken to the operating room,SCDs were placed, and given a general anesthetic. A standard time out was performed and all information was confirmed by those in the room.    The chest was prepped and draped in usual sterile manner.  A 42 mm cookie cutter was used to outline the proposed nipple areolar complexes bilaterally and an 8 cm based inferior pedicle was outlined on each breast.  The right breast was addressed first.  Laparotomy tape was placed at the base of breast as a tourniquet and the pedicle was de-epithelialized sharply.  Electrocautery was used to dissect the borders of the pedicle to the chest wall.  Electrocautery was then used to resect the medial lateral and superior triangles of breast tissue and to develop within the superior skin flap.  The breast tissue removed constituted the bulk of the reduction and weighed 637 gms.  The subfascial space over the pectoralis muscle and the subcutaneous tissues were infiltrated with 50 mL of a mixture of Exparel , quarter percent plain Marcaine , and saline.  The surgical wound was irrigated and hemostasis ensured with electrocautery.  A 19 French round drain was placed behind the pedicle and brought out through a separate stab incision.  Arista hemostatic agent was placed on the surgical wounds.  The T point was approximated with a single 2-0  Monocryl suture and the skin edges were tailor tacked in place with skin clips.  Dermis was approximated with interrupted and running 3-0 Monocryl sutures and the skin was closed with a running 4-0 Monocryl subcuticular stitch.  Attention was turned to the left breast where similar procedure was performed.  After placing a laparotomy tape at the base of the breast as a tourniquet the pedicle was de-epithelialized sharply and dissected to the chest wall with electrocautery.  Medial lateral and superior triangles of breast tissue were resected and the superior skin flap was developed and thinned.  The breast tissue removed constituted the bulk of the reduction and weighed 742 gms.  All breast tissue was sent to pathology for routine examination.  Again the subfascial space was infiltrated with the Exparel  mixture and the surgical bed irrigated.  After ensuring hemostasis a 19 French round drain was placed behind the pedicle and brought out through a separate stab incision.  Arista hemostatic agent was placed on the surgical wounds the T point was approximated with a 2-0 Monocryl suture and the skin edges were tailor tacked in place with skin clips.  Dermis was closed with interrupted and running 3-0 Monocryl sutures and skin was closed with running 4-0 Monocryl subcuticular stitch.  The nipples had good bleeding but seem to be a little congested I elected to place 1/2 inch of Nitropaste on each nipple to be left in place for 24 hours.  All incisions were then sealed with Dermabond the patient was placed in a supportive, compressive garment.  She was awakened from anesthesia without incident and transferred to the recovery room in good condition.  All instrument needle and sponge counts were reported as correct and there were no complications appreciated during the procedure. The patient was allowed to wake up and taken to recovery room in stable condition at the end of the case. The family was notified at the end of  the case.    The advanced practice practitioner (APP) assisted throughout the case.  The APP was essential in retraction and counter traction when needed to make the case progress smoothly.  This retraction and assistance made it possible to see the tissue plans for the procedure.  The assistance was needed for blood control, tissue re-approximation and assisted with closure of the incision site.

## 2024-04-11 NOTE — Interval H&P Note (Signed)
 History and Physical Interval Note: No change in exam or indication for surgery All questions answered Markedd for a bilateral breast reduction with her assistance Will proceed at her request  04/11/2024 11:57 AM  Theresa Morrison  has presented today for surgery, with the diagnosis of n62.  The various methods of treatment have been discussed with the patient and family. After consideration of risks, benefits and other options for treatment, the patient has consented to  Procedure(s): MAMMOPLASTY, REDUCTION (Bilateral) as a surgical intervention.  The patient's history has been reviewed, patient examined, no change in status, stable for surgery.  I have reviewed the patient's chart and labs.  Questions were answered to the patient's satisfaction.     [name]

## 2024-04-11 NOTE — Anesthesia Procedure Notes (Signed)
 Procedure Name: Intubation Date/Time: 04/11/2024 12:27 PM  Performed by: Burnard Rosaline HERO, CRNAPre-anesthesia Checklist: Patient identified, Emergency Drugs available, Suction available and Patient being monitored Patient Re-evaluated:Patient Re-evaluated prior to induction Oxygen Delivery Method: Circle system utilized Preoxygenation: Pre-oxygenation with 100% oxygen Induction Type: IV induction Ventilation: Mask ventilation without difficulty Laryngoscope Size: Mac and 3 Grade View: Grade I Tube type: Oral Tube size: 7.0 mm Number of attempts: 1 Airway Equipment and Method: Stylet and Oral airway Placement Confirmation: ETT inserted through vocal cords under direct vision, positive ETCO2, breath sounds checked- equal and bilateral and CO2 detector Secured at: 22 cm Tube secured with: Tape Dental Injury: Teeth and Oropharynx as per pre-operative assessment

## 2024-04-11 NOTE — Anesthesia Postprocedure Evaluation (Signed)
 Anesthesia Post Note  Patient: Theresa Morrison  Procedure(s) Performed: MAMMOPLASTY, REDUCTION (Bilateral: Breast)     Patient location during evaluation: Phase II Anesthesia Type: General Level of consciousness: awake and alert, oriented and patient cooperative Pain management: pain level controlled Vital Signs Assessment: post-procedure vital signs reviewed and stable Respiratory status: spontaneous breathing, nonlabored ventilation and respiratory function stable Cardiovascular status: blood pressure returned to baseline and stable Postop Assessment: no apparent nausea or vomiting and able to ambulate Anesthetic complications: no   No notable events documented.  Last Vitals:  Vitals:   04/11/24 1645 04/11/24 1658  BP: 107/64 95/62  Pulse: 62 66  Resp: (!) 6 (!) 9  Temp:    SpO2: 94% 99%    Last Pain:  Vitals:   04/11/24 1645  TempSrc:   PainSc: Asleep                 Kinsly Hild,E. Ac Colan

## 2024-04-11 NOTE — Discharge Instructions (Addendum)
 INSTRUCTIONS FOR AFTER BREAST SURGERY   You will likely have some questions about what to expect following your operation.  The following information will help you and your family understand what to expect when you are discharged from the hospital.  It is important to follow these guidelines to help ensure a smooth recovery and reduce complication.  Postoperative instructions include information on: diet, wound care, medications and physical activity.  AFTER SURGERY Expect to go home after the procedure.  In some cases, you may need to spend one night in the hospital for observation.  DIET Breast surgery does not require a specific diet.  However, the healthier you eat the better your body will heal. It is important to increasing your protein intake.  This means limiting the foods with sugar and carbohydrates.  Focus on vegetables and some meat.  If you have liposuction during your procedure be sure to drink water.  If your urine is bright yellow, then it is concentrated, and you need to drink more water.  As a general rule after surgery, you should have 8 ounces of water every hour while awake.  If you find you are persistently nauseated or unable to take in liquids let us  know.  NO TOBACCO USE or EXPOSURE.  This will slow your healing process and lead to a wound.  WOUND CARE Leave the binder on at all times except when showering . Use fragrance free soap like Dial, Dove or [place].   You currently have white nonstick dressings over your nipples. Leave these in place for 24 hours.  After 24 hours you can remove the dressings over your nipples and the binder to shower. Once dry apply binder or sports bra. No baths, pools or hot tubs for four weeks. We close your incision to leave the smallest and best-looking scar. No ointment or creams on your incisions for four weeks.  No Neosporin (Too many skin reactions).  A few weeks after surgery you can use Mederma and start massaging the scar. We ask you to  wear your binder or sports bra for the first 6 weeks around the clock, including while sleeping. This provides added comfort and helps reduce the fluid accumulation at the surgery site. NO Ice or heating pads to the operative site.  You have a very high risk of a BURN before you feel the temperature change. Continue to empty, recharge, & record drainage from drains 2-3 times a day, as needed.   ACTIVITY No heavy lifting until cleared by the doctor.  This usually means no more than a half-gallon of milk.  It is OK to walk and climb stairs. Moving your legs is very important to decrease your risk of a blood clot.  It will also help keep you from getting deconditioned.  Every 1 to 2 hours get up and walk for 5 minutes. This will help with a quicker recovery back to normal.  Let pain be your guide so you don't do too much.  This time is for you to recover.  You will be more comfortable if you sleep and rest with your head elevated either with a few pillows under you or in a recliner.  No stomach sleeping for a three months.  WORK Everyone returns to work at different times. As a rough guide, most people take at least 1 - 2 weeks off prior to returning to work. If you need documentation for your job, give the forms to the front staff at the clinic.  DRIVING  Arrange for someone to bring you home from the hospital after your surgery.  You may be able to drive a few days after surgery but not while taking any narcotics or valium .  BOWEL MOVEMENTS Constipation can occur after anesthesia and while taking pain medication.  It is important to stay ahead for your comfort.  We recommend taking Milk of Magnesia (2 tablespoons; twice a day) while taking the pain pills.  MEDICATIONS You may be prescribed should start after surgery At your preoperative visit for you history and physical you may have been given the following medications: Zofran  4 mg:  This is to treat nausea and vomiting.  You can take this every 6  hours as needed and only if needed. Oxycodone  5 mg:  This is only to be used after you have taken the Motrin or the Tylenol . Every 8 hours as needed. DO NOT RESTART YOUR PLAVIX  UNTIL AFTER YOUR DRAINS ARE REMOVED AT YOUR APPOINTMENT ON [facility] 04/14/24.    Over the counter Medication to take: Ibuprofen (Motrin) 600 mg:  Take this every 6 hours.  If you have additional pain then take 500 mg of the Tylenol  every 8 hours.  Only take the Norco after you have tried these two. MiraLAX or Milk of Magnesia: Take this according to the bottle if you take the Norco.  WHEN TO CALL Call your surgeon's office if any of the following occur: Fever 101 degrees F or greater Excessive bleeding or fluid from the incision site. Pain that increases over time without aid from the medications Redness, warmth, or pus draining from incision sites Persistent nausea or inability to take in liquids Severe misshapen area that underwent the operation.  Here are some resources for breast cancer patients:  Plastic surgery website: https://www.plasticsurgery.org/for-medical-professionals/education-and-resources/publications/breast-reconstruction-magazine Breast Reconstruction Awareness Campaign:  ChessContest[employer]fr Plastic surgery Implant information:  https://www.plasticsurgery.org/patient-safety/breast-implant-safety    *You had 1000 mg of tylenol  at 10:50am  Post Anesthesia Home Care Instructions  Activity: Get plenty of rest for the remainder of the day. A responsible individual must stay with you for 24 hours following the procedure.  For the next 24 hours, DO NOT: -Drive a car -Advertising copywriter -Drink alcoholic beverages -Take any medication unless instructed by your physician -Make any legal decisions or sign important papers.  Meals: Start with liquid foods such as gelatin or soup. Progress to regular foods as tolerated. Avoid greasy, spicy, heavy foods. If nausea and/or vomiting occur, drink  only clear liquids until the nausea and/or vomiting subsides. Call your physician if vomiting continues.  Special Instructions/Symptoms: Your throat may feel dry or sore from the anesthesia or the breathing tube placed in your throat during surgery. If this causes discomfort, gargle with warm salt water. The discomfort should disappear within 24 hours.  If you had a scopolamine  patch placed behind your ear for the management of post- operative nausea and/or vomiting:  1. The medication in the patch is effective for 72 hours, after which it should be removed.  Wrap patch in a tissue and discard in the trash. Wash hands thoroughly with soap and water. 2. You may remove the patch earlier than 72 hours if you experience unpleasant side effects which may include dry mouth, dizziness or visual disturbances. 3. Avoid touching the patch. Wash your hands with soap and water after contact with the patch.      About my Jackson-Pratt Bulb Drain  What is a Jackson-Pratt bulb? A Jackson-Pratt is a soft, round device used to collect drainage. It is  connected to a long, thin drainage catheter, which is held in place by one or two small stiches near your surgical incision site. When the bulb is squeezed, it forms a vacuum, forcing the drainage to empty into the bulb.  Emptying the Jackson-Pratt bulb- To empty the bulb: 1. Release the plug on the top of the bulb. 2. Pour the bulb's contents into a measuring container which your nurse will provide. 3. Record the time emptied and amount of drainage. Empty the drain(s) as often as your     doctor or nurse recommends.  Date                  Time                    Amount (Drain 1)                 Amount (Drain  2)  _____________________________________________________________________  _____________________________________________________________________  _____________________________________________________________________  _____________________________________________________________________  _____________________________________________________________________  _____________________________________________________________________  _____________________________________________________________________  _____________________________________________________________________  Squeezing the Jackson-Pratt Bulb- To squeeze the bulb: 1. Make sure the plug at the top of the bulb is open. 2. Squeeze the bulb tightly in your fist. You will hear air squeezing from the bulb. 3. Replace the plug while the bulb is squeezed. 4. Use a safety pin to attach the bulb to your clothing. This will keep the catheter from     pulling at the bulb insertion site.  When to call your doctor- Call your doctor if: Drain site becomes red, swollen or hot. You have a fever greater than 101 degrees F. There is oozing at the drain site. Drain falls out (apply a guaze bandage over the drain hole and secure it with tape). Drainage increases daily not related to activity patterns. (You will usually have more drainage when you are active than when you are resting.) Drainage has a bad odor.

## 2024-04-11 NOTE — Transfer of Care (Signed)
 Immediate Anesthesia Transfer of Care Note  Patient: Theresa Morrison  Procedure(s) Performed: MAMMOPLASTY, REDUCTION (Bilateral: Breast)  Patient Location: PACU  Anesthesia Type:General  Level of Consciousness: drowsy and patient cooperative  Airway & Oxygen Therapy: Patient Spontanous Breathing and Patient connected to nasal cannula oxygen  Post-op Assessment: Report given to RN and Post -op Vital signs reviewed and stable  Post vital signs: Reviewed and stable  Last Vitals:  Vitals Value Taken Time  BP 110/71 04/11/24 16:05  Temp    Pulse 63 04/11/24 16:06  Resp 11 04/11/24 16:06  SpO2 98 % 04/11/24 16:06  Vitals shown include unfiled device data.  Last Pain:  Vitals:   04/11/24 1045  TempSrc: Temporal  PainSc: 0-No pain         Complications: No notable events documented.

## 2024-04-11 NOTE — Anesthesia Preprocedure Evaluation (Addendum)
 Anesthesia Evaluation  Patient identified by MRN, date of birth, ID band Patient awake    Reviewed: Allergy & Precautions, NPO status , Patient's Chart, lab work & pertinent test results  History of Anesthesia Complications Negative for: history of anesthetic complications  Airway Mallampati: II  TM Distance: >3 FB Neck ROM: Full    Dental  (+) Chipped, Dental Advisory Given   Pulmonary neg pulmonary ROS   breath sounds clear to auscultation       Cardiovascular (-) angina  Rhythm:Regular Rate:Normal  Echocardiogram 10/24/2021:  Left ventricle cavity is normal in size and wall thickness. Normal global  wall motion. Normal LV systolic function with EF 56%. Normal diastolic  filling pattern.  Trace MR, TR.  No evidence of pulmonary hypertension.  Unlike previous study in 2015, mitral valve prolapse not appreciated on  this study.     Neuro/Psych  Headaches   Depression    CVA, No Residual Symptoms    GI/Hepatic negative GI ROS, Neg liver ROS,,,  Endo/Other  negative endocrine ROS    Renal/GU Renal InsufficiencyRenal disease     Musculoskeletal   Abdominal   Peds  Hematology  (+) HIVPlavix: last 7d ago   Anesthesia Other Findings   Reproductive/Obstetrics                              Anesthesia Physical Anesthesia Plan  ASA: 3  Anesthesia Plan: General   Post-op Pain Management: Tylenol  PO (pre-op)*   Induction: Intravenous  PONV Risk Score and Plan: 3 and Ondansetron , Dexamethasone  and Scopolamine  patch - Pre-op  Airway Management Planned: Oral ETT  Additional Equipment: None  Intra-op Plan:   Post-operative Plan: Extubation in OR  Informed Consent: I have reviewed the patients History and Physical, chart, labs and discussed the procedure including the risks, benefits and alternatives for the proposed anesthesia with the patient or authorized representative who has indicated  his/her understanding and acceptance.     Dental advisory given  Plan Discussed with: CRNA and Surgeon  Anesthesia Plan Comments:          Anesthesia Quick Evaluation

## 2024-04-12 ENCOUNTER — Encounter (HOSPITAL_BASED_OUTPATIENT_CLINIC_OR_DEPARTMENT_OTHER): Payer: Self-pay | Admitting: Plastic Surgery

## 2024-04-12 ENCOUNTER — Telehealth (INDEPENDENT_AMBULATORY_CARE_PROVIDER_SITE_OTHER): Payer: Self-pay

## 2024-04-12 DIAGNOSIS — R509 Fever, unspecified: Secondary | ICD-10-CM

## 2024-04-12 NOTE — Telephone Encounter (Signed)
 Patient had one episode of fever 101 F. No other associated signs or symptoms. Reassured patient and recommended to take Tylenol  500 mg every 6 hours.  If any other associated symptoms develop instructed to call back to discuss best next steps. Patient expressed understanding and agrees with the plan.  Theresa Morrison M. Connar Keating, MD [facility]

## 2024-04-14 ENCOUNTER — Encounter: Payer: Self-pay | Admitting: Student

## 2024-04-14 ENCOUNTER — Telehealth: Payer: Self-pay | Admitting: Student

## 2024-04-14 ENCOUNTER — Encounter: Admitting: Student

## 2024-04-14 ENCOUNTER — Ambulatory Visit (INDEPENDENT_AMBULATORY_CARE_PROVIDER_SITE_OTHER): Admitting: Student

## 2024-04-14 VITALS — BP 101/66 | HR 87 | Temp 98.4°F | Ht 63.0 in | Wt 165.1 lb

## 2024-04-14 DIAGNOSIS — N62 Hypertrophy of breast: Secondary | ICD-10-CM

## 2024-04-14 LAB — SURGICAL PATHOLOGY

## 2024-04-14 NOTE — Progress Notes (Signed)
 Patient is a 68 year old female who recently underwent bilateral breast reduction with Dr. Waddell on 04/11/2024.  Intraoperatively, patient had 637 g removed from the right breast and 742 g removed from the left breast.  Nitropaste was applied to the nipples at the end of the case and left in place for 24 hours.  Patient is about 3 days postop and presents to the clinic today for postoperative follow-up.  Today, patient reports she is doing okay.  She states that she was experiencing fevers on Saturday of 101 F, which went away after taking Tylenol .  She also states that she had a fever on Sunday of 102 F, which went away after she took Tylenol .  She states that today her temperature was 61 F, and she states that she has not taken Tylenol  today.  She reports she otherwise has been feeling fine.  She states that she has not been having any significant pain.  She denies any nausea or vomiting.  She reports has been eating and drinking without issue.  She reports she has been up and ambulating.  She denies any chest pain, shortness of breath or swelling of her lower extremities.  Patient states that each of her drains have been putting out about 30 cc/day for the past day or so.  Patient also states that she has not started back on her Plavix  yet.  Vitals:   04/14/24 1316  BP: 101/66  Pulse: 87  Temp: 98.4 F (36.9 C)  SpO2: 100%  Vitals are stable, patient is afebrile  Chaperone present on exam.  On exam, patient is sitting upright in no acute distress.  Breasts are overall soft and symmetric.  There is bruising noted over both of the breast bilaterally, more so on the right than the left.  There is no overlying erythema.  No obvious fluid collections on exam.  NAC's do appear to be somewhat dark, but it is difficult to tell if this is from the bruising or not.  Sensation was intact to each of the nipples upon palpation.  They are warm and there is capillary refill noted.  Incisions are clean dry and  intact.  There was a small blister noted over the the skin on the right lateral breast.  JP drains are in place and functioning with serosanguineous drainage in each of the bulbs.  JP drains were removed without any difficulty.  Patient tolerated well.  I discussed with the patient that her nipples do appear to be somewhat dark, but this could be from bruising.  I discussed with her that we will have to allow them to declare themselves.  I did discuss the possibility of ischemia with her.  She expressed understanding.  I discussed with the patient that she may apply a small amount of Vaseline to the blister on her right breast once a day and closely monitor it.  Patient expressed understanding.  I discussed with the patient to continue to monitor her temperature and her symptoms.  I discussed with her that if she feels that she has any worsening symptoms or persistent fevers to let us  know.  She expressed understanding.  I discussed with the patient the importance of wearing compression at all times and avoiding strenuous activities.  Now that the drains have been removed, patient may restart her Plavix .  She states that she is going to restart this tonight.  I would like the patient to follow back up next week.  I instructed her to call if she  has any questions or concerns about anything.

## 2024-04-14 NOTE — Progress Notes (Deleted)
 Patient is a 69 year old female who recently underwent bilateral breast reduction with Dr. Waddell on 04/11/2024.  Intraoperatively, patient had 637 g removed from the right breast and 742 g removed from the left breast.  Nitropaste was applied to the nipples at the end of the case and left in place for 24 hours.  Patient is about 3 days postop and presents to the clinic today for postoperative follow-up.  Today,

## 2024-04-14 NOTE — Telephone Encounter (Signed)
 Pt just called and stated she is sched todayat 130pm, Pt sx was 04-11-24, she is to come for drain removal, pt stated she has been running a fever all weekend and wants to know what to do, clinical please call pt is poss. Thank you

## 2024-04-18 ENCOUNTER — Encounter: Admitting: Surgical

## 2024-04-22 ENCOUNTER — Ambulatory Visit (INDEPENDENT_AMBULATORY_CARE_PROVIDER_SITE_OTHER): Admitting: Surgical

## 2024-04-22 ENCOUNTER — Encounter: Admitting: Surgical

## 2024-04-22 VITALS — BP 116/71 | HR 63

## 2024-04-22 DIAGNOSIS — M546 Pain in thoracic spine: Secondary | ICD-10-CM

## 2024-04-22 DIAGNOSIS — N62 Hypertrophy of breast: Secondary | ICD-10-CM

## 2024-04-22 NOTE — Progress Notes (Signed)
 Patient is a 68 y.o.-year-old female status post bilateral breast reduction with Dr.  Waddell. Patient is 1.5 weeks postop.  Patient reports she is overall doing well.  She has noticed some improvement in the bruising of her bilateral breast.  She reports she has not noticed any issues with the nipple areola.  She is back on her blood thinners and taking these without issue.  She has not noticed any increase swelling or bruising since restarting.  Chaperone present on exam Bilateral NAC's are viable, warm to palpation, sensation intact.  They are still slightly darker in color than preoperatively, but they are obviously warm and have capillary refill bilateral breast incisions are intact. There is no erythema or cellulitic changes noted. No obvious subcutaneous fluid collections noted with palpation. Some mild ecchymosis noted of bilateral breast.  A/P:  Patient is overall doing well, there is no signs of nipple areolar necrosis on exam.  No signs of infection or concern.    Recommend continuing with compressive garment 24/7 until 6 weeks post-op,  avoiding strenuous activity/heavy lifting until 6 weeks post-op  Recommend following up in 2 to 3 weeks  All of the patient's questions were answered to their content. Recommend calling with any questions or concerns.  Pictures were obtained of the patient and placed in the chart with the patient's or guardian's permission.

## 2024-05-02 ENCOUNTER — Encounter: Admitting: Surgical

## 2024-05-07 ENCOUNTER — Encounter: Payer: Self-pay | Admitting: Student

## 2024-05-07 ENCOUNTER — Ambulatory Visit (INDEPENDENT_AMBULATORY_CARE_PROVIDER_SITE_OTHER): Admitting: Student

## 2024-05-07 VITALS — BP 105/68 | HR 84 | Ht 63.0 in | Wt 162.2 lb

## 2024-05-07 DIAGNOSIS — N62 Hypertrophy of breast: Secondary | ICD-10-CM

## 2024-05-07 DIAGNOSIS — Z9889 Other specified postprocedural states: Secondary | ICD-10-CM

## 2024-05-07 MED ORDER — DOXYCYCLINE HYCLATE 100 MG PO TABS
100.0000 mg | ORAL_TABLET | Freq: Two times a day (BID) | ORAL | 0 refills | Status: AC
Start: 2024-05-07 — End: 2024-05-12

## 2024-05-07 NOTE — Progress Notes (Signed)
 Patient is a 68 year old female who underwent bilateral breast reduction with Dr. Waddell on 04/11/2024.  Patient is almost 4 weeks postop.  She presents to the clinic today for postoperative with concerns about her surgical site.  Patient was last seen in the clinic on 04/22/2024.  At this visit, patient was overall doing well.  On exam, NAC's were viable, warm to palpation and sensation was intact.  They were slightly darker in color than preoperatively, but are obviously warm and have capillary refill.  Incisions are intact.  There is no overlying erythema or cellulitic changes.  No fluid collections on exam.  Today, patient reports she is doing okay.  She states that a few days ago, she noticed a little bit of bloody drainage on her sports bra, and then yesterday, she noticed a lot more on her sports bra and a nonstick dressing.  She denies any fevers or chills.  She denies any significant pain to the area.  She denies any other problems issues or concerns.  Chaperone present on exam.  On exam, patient sitting upright in no acute distress.  Breasts are overall soft and symmetric.  There is a little bit of erythema noted to the inferior breasts bilaterally/medially.  There are no cellulitic changes.  I suspect that this is some discoloration.  There is no tenderness to palpation.  NAC's appear to be healthy, sensations are intact.  Incisions to the left breast are intact and healing well.  To the right breast, there is about a 6 x 2 cm area of breakdown near the T-zone.  There is some exudate noted to the superior aspect of it, healthy appearing tissue throughout the remainder.  Remainder of the incisions to the right breast are healing well.  There is some residual Dermabond noted over the incisions bilaterally.  Discussed with the patient that she has some breakdown of the right T-zone.  Recommended that she clean the area with Vashe and apply Xeroform over it daily.  Will try to order supplies through  [facility] .  I discussed with her that if she is unable to buy the supplies herself or [facility]  cannot supply them, she should apply a thick layer of Vaseline and a nonstick gauze over the area daily.  I also discussed with the patient that the wound will most likely worsen before it improves.  Patient expressed understanding.  Given erythema to the medial/inferior breasts, will cover patient with antibiotic.  Discussed with her to closely monitor these areas and if she has any worsening redness, pain, fevers or chills she should let us  know.  Patient expressed understanding.  Discussed with patient to continue to keep a close eye on her surgical site and if she has any further questions or concerns she can call us .  Patient to otherwise follow back up next week.  Instructed her to call if she has any questions or concerns about anything.  Pictures were obtained of the patient and placed in the chart with the patient's or guardian's permission.

## 2024-05-08 ENCOUNTER — Encounter: Admitting: Surgical

## 2024-05-15 ENCOUNTER — Ambulatory Visit (INDEPENDENT_AMBULATORY_CARE_PROVIDER_SITE_OTHER): Admitting: Plastic Surgery

## 2024-05-15 ENCOUNTER — Telehealth: Payer: Self-pay

## 2024-05-15 DIAGNOSIS — Z9889 Other specified postprocedural states: Secondary | ICD-10-CM

## 2024-05-15 DIAGNOSIS — N62 Hypertrophy of breast: Secondary | ICD-10-CM

## 2024-05-15 DIAGNOSIS — S21001S Unspecified open wound of right breast, sequela: Secondary | ICD-10-CM

## 2024-05-15 NOTE — Telephone Encounter (Signed)
 Faxed [facility]  wound order requesting a change in products. We are changing the Xeroform to Adaptic. Confirmation of receipt.

## 2024-05-15 NOTE — Progress Notes (Signed)
 Theresa Morrison underwent a bilateral breast reduction on September 19.  She did well during the first part of her recovery however approximately a month postop she was noted to have separation of the lateral portion of the inframammary incision starting at the T-junction the separation has been stable and is healing but is still somewhat uncomfortable.  She returns today for evaluation  On evaluation the wound is clean with healing granulating tissue.  There is no evidence of infection.  I have encouraged her to be slightly more aggressive when she showers.  If the nonadherent dressing is stuck to the wound she can shower to remove that.  She will continue with Adaptic and Vashe soaked gauze.  She will return in 2 weeks sooner if needed.

## 2024-05-26 ENCOUNTER — Encounter: Payer: Self-pay | Admitting: Radiology

## 2024-05-29 ENCOUNTER — Ambulatory Visit (INDEPENDENT_AMBULATORY_CARE_PROVIDER_SITE_OTHER): Admitting: Plastic Surgery

## 2024-05-29 DIAGNOSIS — T8131XD Disruption of external operation (surgical) wound, not elsewhere classified, subsequent encounter: Secondary | ICD-10-CM

## 2024-05-29 DIAGNOSIS — Z9889 Other specified postprocedural states: Secondary | ICD-10-CM

## 2024-05-29 NOTE — Progress Notes (Signed)
 Ms. Holstine returns today almost 2 months postop from a bilateral breast reduction.  While her overall shape and size is quite nice she did have breakdown of the inframammary incision on the right side.  The total length of the incision is approximately 9 cm.  On evaluation the wound is clean and granulating nicely.  Approximately 50% of the wound has reepithelialized.  We discussed that while this is not the outcome that we had hoped for she is healing nicely and that the wound should be healed within the next month.  In 1 year if the scar is not acceptable she could easily undergo a simple scar revision.  Continue dressings as instructed.  Follow-up with me in 1 month sooner if there are any questions or concerns.

## 2024-06-11 ENCOUNTER — Encounter: Admitting: Student

## 2024-06-24 ENCOUNTER — Other Ambulatory Visit: Payer: Medicare Other

## 2024-06-24 ENCOUNTER — Other Ambulatory Visit: Payer: Self-pay

## 2024-06-24 DIAGNOSIS — Z113 Encounter for screening for infections with a predominantly sexual mode of transmission: Secondary | ICD-10-CM

## 2024-06-24 DIAGNOSIS — B20 Human immunodeficiency virus [HIV] disease: Secondary | ICD-10-CM

## 2024-06-24 DIAGNOSIS — Z79899 Other long term (current) drug therapy: Secondary | ICD-10-CM

## 2024-06-25 LAB — T-HELPER CELL (CD4) - (RCID CLINIC ONLY)
CD4 % Helper T Cell: 42 % (ref 33–65)
CD4 T Cell Abs: 259 /uL — ABNORMAL LOW (ref 400–1790)

## 2024-06-26 LAB — CBC WITH DIFFERENTIAL/PLATELET
Absolute Lymphocytes: 715 {cells}/uL — ABNORMAL LOW (ref 850–3900)
Absolute Monocytes: 286 {cells}/uL (ref 200–950)
Basophils Absolute: 21 {cells}/uL (ref 0–200)
Basophils Relative: 0.8 %
Eosinophils Absolute: 10 {cells}/uL — ABNORMAL LOW (ref 15–500)
Eosinophils Relative: 0.4 %
HCT: 39.5 % (ref 35.9–46.0)
Hemoglobin: 13.1 g/dL (ref 11.7–15.5)
MCH: 33.2 pg — ABNORMAL HIGH (ref 27.0–33.0)
MCHC: 33.2 g/dL (ref 31.6–35.4)
MCV: 100.3 fL (ref 81.4–101.7)
MPV: 10.2 fL (ref 7.5–12.5)
Monocytes Relative: 11 %
Neutro Abs: 1568 {cells}/uL (ref 1500–7800)
Neutrophils Relative %: 60.3 %
Platelets: 162 Thousand/uL (ref 140–400)
RBC: 3.94 Million/uL (ref 3.80–5.10)
RDW: 12 % (ref 11.0–15.0)
Total Lymphocyte: 27.5 %
WBC: 2.6 Thousand/uL — ABNORMAL LOW (ref 3.8–10.8)

## 2024-06-26 LAB — COMPLETE METABOLIC PANEL WITHOUT GFR
AG Ratio: 2.5 (calc) (ref 1.0–2.5)
ALT: 18 U/L (ref 6–29)
AST: 23 U/L (ref 10–35)
Albumin: 4.2 g/dL (ref 3.6–5.1)
Alkaline phosphatase (APISO): 140 U/L (ref 37–153)
BUN: 9 mg/dL (ref 7–25)
CO2: 28 mmol/L (ref 20–32)
Calcium: 9.5 mg/dL (ref 8.6–10.4)
Chloride: 108 mmol/L (ref 98–110)
Creat: 1 mg/dL (ref 0.50–1.05)
Globulin: 1.7 g/dL — ABNORMAL LOW (ref 1.9–3.7)
Glucose, Bld: 85 mg/dL (ref 65–99)
Potassium: 3.7 mmol/L (ref 3.5–5.3)
Sodium: 144 mmol/L (ref 135–146)
Total Bilirubin: 0.4 mg/dL (ref 0.2–1.2)
Total Protein: 5.9 g/dL — ABNORMAL LOW (ref 6.1–8.1)

## 2024-06-26 LAB — SYPHILIS: RPR W/REFLEX TO RPR TITER AND TREPONEMAL ANTIBODIES, TRADITIONAL SCREENING AND DIAGNOSIS ALGORITHM: RPR Ser Ql: NONREACTIVE

## 2024-06-26 LAB — HIV-1 RNA QUANT-NO REFLEX-BLD
HIV 1 RNA Quant: NOT DETECTED {copies}/mL
HIV-1 RNA Quant, Log: NOT DETECTED {Log_copies}/mL

## 2024-06-26 LAB — LIPID PANEL
Cholesterol: 121 mg/dL (ref <200)
HDL: 45 mg/dL — ABNORMAL LOW (ref >=50)
LDL Cholesterol (Calc): 57 mg/dL
Non-HDL Cholesterol (Calc): 76 mg/dL (ref <130)
Total CHOL/HDL Ratio: 2.7 (calc) (ref <5.0)
Triglycerides: 100 mg/dL (ref <150)

## 2024-06-30 ENCOUNTER — Ambulatory Visit: Admitting: Plastic Surgery

## 2024-06-30 ENCOUNTER — Encounter: Admitting: Student

## 2024-06-30 ENCOUNTER — Encounter: Payer: Self-pay | Admitting: Plastic Surgery

## 2024-06-30 VITALS — BP 120/70 | HR 65 | Wt 160.0 lb

## 2024-06-30 DIAGNOSIS — Z9889 Other specified postprocedural states: Secondary | ICD-10-CM

## 2024-06-30 DIAGNOSIS — T8131XD Disruption of external operation (surgical) wound, not elsewhere classified, subsequent encounter: Secondary | ICD-10-CM

## 2024-06-30 NOTE — Progress Notes (Signed)
 Ms. Offenberger returns today for evaluation of the wound on the right breast.  This is largely reepithelialized and she has an area approximately 1 cm x 1 cm that is still healing.  Overall she has nice shape and symmetry of the breast.  She will continue to use Vaseline on the area that is reepithelialized and  She will return to see me in 1 month sooner if she needs anything.

## 2024-07-02 ENCOUNTER — Encounter: Admitting: Student

## 2024-07-07 ENCOUNTER — Encounter: Admitting: Plastic Surgery

## 2024-07-08 ENCOUNTER — Other Ambulatory Visit: Payer: Self-pay

## 2024-07-08 ENCOUNTER — Encounter: Payer: Self-pay | Admitting: Internal Medicine

## 2024-07-08 ENCOUNTER — Ambulatory Visit: Payer: Self-pay | Admitting: Internal Medicine

## 2024-07-08 VITALS — BP 113/74 | HR 64 | Temp 97.7°F | Resp 16 | Wt 162.0 lb

## 2024-07-08 DIAGNOSIS — E669 Obesity, unspecified: Secondary | ICD-10-CM

## 2024-07-08 DIAGNOSIS — E881 Lipodystrophy, unspecified: Secondary | ICD-10-CM

## 2024-07-08 DIAGNOSIS — Z23 Encounter for immunization: Secondary | ICD-10-CM

## 2024-07-08 DIAGNOSIS — B20 Human immunodeficiency virus [HIV] disease: Secondary | ICD-10-CM

## 2024-07-08 MED ORDER — ABACAVIR-DOLUTEGRAVIR-LAMIVUD 600-50-300 MG PO TABS: 1.0000 | ORAL_TABLET | Freq: Every day | ORAL | 11 refills | Status: DC

## 2024-07-08 NOTE — Patient Instructions (Signed)
 All labs look great   Referral to bariatric surgery placed    Next year if you plan to do labs before hand please request these: Hiv viral load Cd4 Quantiferon gold (tb screening)

## 2024-07-08 NOTE — Progress Notes (Signed)
 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   Elevated alkaline phosphatase level 01/14/2013   Renal insufficiency 01/09/2012   Dyslipidemia 05/11/2011   Hip pain, left 03/08/2011   Disorder of bursae and tendons in shoulder region 07/12/2010   Human immunodeficiency virus (HIV) disease (HCC) 10/11/2006   HSV 10/11/2006   RECTAL BLEEDING 10/11/2006   SYMPTOM, ABNORMAL LOSS OF WEIGHT 10/11/2006   HERPES ZOSTER 10/02/2006   CHLAMYDIA TRACHOMATIS, 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   ATORVASTATIN  (LIPITOR) 10 MG TABLET    Take 10 mg by mouth daily.   CALCIUM  CARBONATE (OS-CAL) 600 MG TABS    Take 1,200 mg by mouth daily.    CHOLECALCIFEROL (VITAMIN D-3) 1000 UNITS CAPS    Take 1,000 Units by mouth daily.   CLOPIDOGREL  (PLAVIX ) 75 MG TABLET    TAKE 1 TABLET BY MOUTH DAILY   FARICIMAB-SVOA (VABYSMO) 6 MG/0.05ML SOLN INTRAVITREAL INJECTION    6 mg by Intravitreal route See admin instructions. Eye injection every 4 months in office   MULTIPLE VITAMINS-MINERALS (MACULAR HEALTH FORMULA PO)    Take 1 capsule by mouth daily.  Modified Medications   No medications on file  Discontinued Medications   No medications on file    Subjective: Theresa Morrison is in for her routine HIV follow-up visit.  She denies any problems obtaining, taking or tolerating her Triumeq .  She takes it each evening and does not recall missing any doses.  She is not on any new medications since her last visit.   She has had her annual influenza vaccine but has never taken a COVID-vaccine and does not want one today.  She is not getting any regular exercise.  She denies feeling depressed.  ----------- 07/[facility]/23 [facility] f[facility]u First time seeing me Lipoatrophy -- she has abd fat and upper trunk and wonder if anything to do about it. She started hiv tx from 63s  Hiv- wants to continue triumeq   Hx stroke- maintained on plavix  (was on dual platelet therapy with aspirin  initially). She was having frequent superficial bleeding so she stopped the aspirin . She also takes statin 10 mg  Social -lives with husband -- seronegative -no smoking drinking -not working  We reviewed labs and excellent hiv control. Very compliant  07/08/24 [facility] f/u See a&p for details Compliant with triumeq   Review of Systems: Review of Systems  Constitutional:  Negative for fever and weight loss.  Psychiatric/Behavioral:  Negative for depression.     Past Medical History:  Diagnosis Date   Autoimmune hemolytic anemia (HCC)    Colon polyps    adenomatous   CVA (cerebral vascular accident) (HCC) 2000   Headache    HIV positive (HCC) 1987   Hypercholesterolemia    Hyperlipidemia    Internal hemorrhoids    Shingles    Toxoplasmosis     Social History   Tobacco Use   Smoking status: Never   Smokeless tobacco: Never  Substance Use  Topics   Alcohol use: No    Alcohol/week: 0.0 standard drinks of alcohol   Drug use: No    Family History  Problem Relation Age of Onset   Heart attack Father    High Cholesterol Father    Breast cancer Mother 50       mets to lung and brain   Prostate cancer Brother    Prostate cancer Brother    Stroke Neg Hx    Migraines Neg Hx    Neuropathy Neg Hx    Colon cancer Neg Hx    Esophageal cancer Neg Hx    Pancreatic cancer Neg Hx    Stomach cancer Neg Hx    Liver disease Neg Hx     Allergies  Allergen Reactions   Penicillins     REACTION: facial swelling    Stavudine     REACTION: peripheral neuropathy   Sulfamethoxazole-Trimethoprim     REACTION: hives   Sulfa Antibiotics Rash    Health Maintenance  Topic Date Due   Medicare Annual Wellness (AWV)  Never done   COVID-19 Vaccine (1) Never done   Colonoscopy  06/29/2024   Pneumococcal Vaccine: 50+ Years (4 of 4 - PCV20 or PCV21) 06/30/2024   Mammogram  11/14/2025   DTaP/Tdap/Td (3 - Td or Tdap) 03/28/2028   Influenza Vaccine  Completed   Bone Density Scan  Completed   Hepatitis C Screening  Completed   Zoster Vaccines- Shingrix  Completed   Meningococcal B Vaccine  Aged Out   Hepatitis B Vaccines 19-59 Average Risk  Discontinued    Objective:  Vitals:   07/08/24 0938 07/08/24 0939  BP:  113/74  Pulse:  64  Resp:  16  Temp:  97.7 F (36.5 C)  TempSrc:  Oral  SpO2:  99%  Weight: 162 lb (73.5 kg)    Body mass index is 28.7 kg/m.  Physical Exam Constitutional:      Comments: She is quiet and pleasant as usual.  There is no change in her central adiposity or her weight.  Cardiovascular:     Rate and Rhythm: Normal rate and regular rhythm.     Heart sounds: No murmur heard. Pulmonary:     Effort: Pulmonary effort is normal.     Breath sounds: Normal breath sounds.  Psychiatric:        Mood and Affect: Mood normal.     Lab Results Lab Results  Component Value Date   WBC 2.6 (L) 06/24/2024   HGB 13.1 06/24/2024   HCT 39.5 06/24/2024   MCV 100.3 06/24/2024   PLT 162 06/24/2024    Lab Results  Component Value Date   CREATININE 1.00 06/24/2024   BUN 9 06/24/2024   NA 144 06/24/2024   K 3.7 06/24/2024   CL 108 06/24/2024   CO2 28 06/24/2024    Lab Results  Component Value Date   ALT 18 06/24/2024   AST 23 06/24/2024   ALKPHOS 136 (H) 09/22/2023   BILITOT 0.4 06/24/2024    Lab Results  Component Value Date   CHOL 121 06/24/2024   HDL 45 (L) 06/24/2024   LDLCALC 57 06/24/2024   TRIG 100 06/24/2024   CHOLHDL 2.7 06/24/2024   Lab Results  Component  Value Date   LABRPR NON-REACTIVE 06/24/2024   HIV 1 RNA Quant  Date Value  06/24/2024 NOT DETECTED copies/mL  06/15/2023 Not Detected Copies/mL  06/07/2022 Not Detected Copies/mL   CD4 T Cell Abs (/uL)  Date Value  06/24/2024 259 (L)  06/07/2022 289 (L)  05/24/2021 289 (L)     Problem List Items Addressed This Visit   None Visit Diagnoses       Encounter for immunization    -  Primary   Relevant Orders   Flu vaccine HIGH DOSE PF(Fluzone Trivalent) (Completed)        #hiv Well controlled on triumeq  and wants to continue Discuss possible association abacavir  with cv risk but she wants to continue. Hx cva before starting triumeq    07/08/24 compliant. Wants to continue triumeq . Labs reviewed and look great  -discussed u=u -encourage compliance -continue current HIV medication -labs 1 yr during f/u -f/u in 1 yr   #hx cva #reprieve trial -- discussed with her the dose of statin/result of trial She wants to continue just the 10 mg atorvastatin  a day  -continue atorvastatin  10 mg daily   #obesity/lipodystrophy Patient wants to be referred for liposuction/abdominal fat surgery  -bariatric surgery referral placed     #hcm -vaccination Needs prevnar 20 but doesn't want Utd otherwise -std Monogamous and doesn't want to do it -metabolic/reprieve On atorvastatin  10 mg; did discuss going to 40 mg or going to more potent statin but she didn't want -tb screening Will do next time -cancer screening Defer to pcp Pap 2025 normal Mammography 10/2023 benign Pending colonoscopy 07/2024    Constance ONEIDA Passer, MD [facility] [phone] pager   [phone] cell 07/08/2024, 10:00 AM

## 2024-07-12 ENCOUNTER — Other Ambulatory Visit: Payer: Self-pay | Admitting: Internal Medicine

## 2024-07-12 DIAGNOSIS — B20 Human immunodeficiency virus [HIV] disease: Secondary | ICD-10-CM

## 2024-07-21 ENCOUNTER — Encounter (HOSPITAL_COMMUNITY): Payer: Self-pay

## 2024-07-21 ENCOUNTER — Ambulatory Visit (HOSPITAL_COMMUNITY): Admission: EM | Admit: 2024-07-21 | Discharge: 2024-07-21 | Disposition: A | Discharge status: Home / Self Care

## 2024-07-21 ENCOUNTER — Ambulatory Visit (HOSPITAL_COMMUNITY)

## 2024-07-21 ENCOUNTER — Encounter: Admitting: Student

## 2024-07-21 DIAGNOSIS — J101 Influenza due to other identified influenza virus with other respiratory manifestations: Secondary | ICD-10-CM

## 2024-07-21 MED ORDER — IBUPROFEN 800 MG PO TABS: 800.0000 mg | ORAL_TABLET | Freq: Three times a day (TID) | ORAL | 0 refills | Status: DC

## 2024-07-21 MED ORDER — AZELASTINE HCL 0.1 % NA SOLN: 1.0000 | Freq: Two times a day (BID) | NASAL | 1 refills | Status: DC

## 2024-07-21 MED ORDER — PROMETHAZINE-DM 6.25-15 MG/5ML PO SYRP: 10.0000 mL | ORAL_SOLUTION | Freq: Three times a day (TID) | ORAL | 0 refills | Status: DC | PRN

## 2024-07-21 NOTE — ED Triage Notes (Signed)
 Pt states cough x5 days. States [facility] and told to come her for chest x-ray to r/o PNA. No meds given.

## 2024-07-21 NOTE — ED Provider Notes (Signed)
 " [facility] [place]  Note:  This document was prepared using Dragon voice recognition software and may include unintentional dictation errors.  MRN: [id number] DOB: 1956-02-04  Subjective:   Theresa Morrison is a 68 y.o. female presenting for cough x 5 days with [facility] influenza [facility]  Patient was advised by clinic staff to come here for a chest x-ray to rule out pneumonia after having symptoms for 5 days.  Patient denies any medications were prescribed in the clinic.  No fever, body aches, weakness, dizziness, shortness of breath, chest pain.  Current Medications[1]   Allergies[2]  Past Medical History:  Diagnosis Date   Autoimmune hemolytic anemia (HCC)    Colon polyps    adenomatous   CVA (cerebral vascular accident) (HCC) 2000   Headache    HIV positive (HCC) 1987   Hypercholesterolemia    Hyperlipidemia    Internal hemorrhoids    Shingles    Toxoplasmosis      Past Surgical History:  Procedure Laterality Date   APPENDECTOMY  2001   BREAST REDUCTION SURGERY Bilateral 04/11/2024   Procedure: MAMMOPLASTY, REDUCTION;  Surgeon: Waddell Leonce NOVAK, MD;  Location: [facility];  Service: Plastics;  Laterality: Bilateral;   KNEE ARTHROSCOPY Right    x 2   SHOULDER SURGERY Right    TONSILLECTOMY     ULNAR NERVE TRANSPOSITION Left 03/20/2017   Procedure: LEFT ELBOW ULNAR NERVE DECOMPRESSION;  Surgeon: Murrell Kuba, MD;  Location: [facility];  Service: Orthopedics;  Laterality: Left;    Family History  Problem Relation Age of Onset   Heart attack Father    High Cholesterol Father    Breast cancer Mother 50       mets to lung and brain   Prostate cancer Brother    Prostate cancer Brother    Stroke Neg Hx    Migraines Neg Hx    Neuropathy Neg Hx    Colon cancer Neg Hx    Esophageal cancer Neg Hx    Pancreatic cancer Neg Hx    Stomach cancer Neg Hx    Liver disease Neg Hx     Social History[3]  ROS Refer  to HPI for ROS details.  Objective:    Vitals: BP 115/76 (BP Location: Left Arm)   Pulse 85   Temp 98.2 F (36.8 C) (Oral)   Resp 18   SpO2 96%   Physical Exam Vitals and nursing note reviewed.  Constitutional:      General: She is not in acute distress.    Appearance: Normal appearance. She is well-developed. She is not ill-appearing or toxic-appearing.  HENT:     Head: Normocephalic and atraumatic.  Cardiovascular:     Rate and Rhythm: Normal rate.  Pulmonary:     Effort: Pulmonary effort is normal. No respiratory distress.     Breath sounds: No stridor. No wheezing.  Skin:    General: Skin is warm and dry.  Neurological:     General: No focal deficit present.     Mental Status: She is alert and oriented to person, place, and time.  Psychiatric:        Mood and Affect: Mood normal.        Behavior: Behavior normal.     Procedures  No results found for this or any previous visit (from the past 24 hours).  Assessment and Plan :     Discharge Instructions       1. Influenza  A (Primary) - DG Chest 2 View x-ray performed in UC shows no acute cardiopulmonary processes, no sign of consolidation or pneumonia. - azelastine  (ASTELIN ) 0.1 % nasal spray; Place 1 spray into both nostrils 2 (two) times daily. Use in each nostril as directed  Dispense: 30 mL; Refill: 1 - promethazine -dextromethorphan (PROMETHAZINE -DM) 6.25-15 MG/5ML syrup; Take 10 mLs by mouth 3 (three) times daily as needed for cough.  Dispense: 240 mL; Refill: 0 - ibuprofen  (ADVIL ) 800 MG tablet; Take 1 tablet (800 mg total) by mouth 3 (three) times daily.  Dispense: 30 tablet; Refill: 0  -Continue to monitor symptoms for any change in severity if there is any escalation of current symptoms or development of new symptoms follow-up in ER for further evaluation and management.      Jaila Schellhorn B Aleyda Gindlesperger    [1]  Current Facility-Administered Medications:    ondansetron  (ZOFRAN -ODT) disintegrating  tablet 4 mg, 4 mg, Oral, Q8H PRN,   Current Outpatient Medications:    azelastine  (ASTELIN ) 0.1 % nasal spray, Place 1 spray into both nostrils 2 (two) times daily. Use in each nostril as directed, Disp: 30 mL, Rfl: 1   ibuprofen  (ADVIL ) 800 MG tablet, Take 1 tablet (800 mg total) by mouth 3 (three) times daily., Disp: 30 tablet, Rfl: 0   promethazine -dextromethorphan (PROMETHAZINE -DM) 6.25-15 MG/5ML syrup, Take 10 mLs by mouth 3 (three) times daily as needed for cough., Disp: 240 mL, Rfl: 0   atorvastatin  (LIPITOR) 10 MG tablet, Take 10 mg by mouth daily., Disp: , Rfl:    calcium  carbonate (OS-CAL) 600 MG TABS, Take 1,200 mg by mouth daily. , Disp: , Rfl:    Cholecalciferol (VITAMIN D-3) 1000 units CAPS, Take 1,000 Units by mouth daily., Disp: , Rfl:    clopidogrel  (PLAVIX ) 75 MG tablet, TAKE 1 TABLET BY MOUTH DAILY, Disp: 90 tablet, Rfl: 1   faricimab-svoa (VABYSMO) 6 MG/0.05ML SOLN intravitreal injection, 6 mg by Intravitreal route See admin instructions. Eye injection every 4 months in office, Disp: , Rfl:    TRIUMEQ  600-50-300 MG tablet, TAKE 1 TABLET BY MOUTH EVERY DAY, Disp: 30 tablet, Rfl: 11 [2]  Allergies Allergen Reactions   Penicillins     REACTION: facial swelling   Stavudine     REACTION: peripheral neuropathy   Sulfamethoxazole-Trimethoprim     REACTION: hives   Sulfa Antibiotics Rash  [3]  Social History Tobacco Use   Smoking status: Never   Smokeless tobacco: Never  Substance Use Topics   Alcohol use: No    Alcohol/week: 0.0 standard drinks of alcohol   Drug use: No     Aurea Goodell B, NP 07/21/24 1930  "

## 2024-07-21 NOTE — Discharge Instructions (Addendum)
" °  1. Influenza A (Primary) - DG Chest 2 View x-ray performed in UC shows no acute cardiopulmonary processes, no sign of consolidation or pneumonia. - azelastine  (ASTELIN ) 0.1 % nasal spray; Place 1 spray into both nostrils 2 (two) times daily. Use in each nostril as directed  Dispense: 30 mL; Refill: 1 - promethazine -dextromethorphan (PROMETHAZINE -DM) 6.25-15 MG/5ML syrup; Take 10 mLs by mouth 3 (three) times daily as needed for cough.  Dispense: 240 mL; Refill: 0 - ibuprofen  (ADVIL ) 800 MG tablet; Take 1 tablet (800 mg total) by mouth 3 (three) times daily.  Dispense: 30 tablet; Refill: 0  -Continue to monitor symptoms for any change in severity if there is any escalation of current symptoms or development of new symptoms follow-up in ER for further evaluation and management. "

## 2024-07-22 ENCOUNTER — Ambulatory Visit (HOSPITAL_COMMUNITY): Payer: Self-pay

## 2024-07-28 DIAGNOSIS — E66812 Obesity, class 2: Secondary | ICD-10-CM

## 2024-07-28 DIAGNOSIS — E881 Lipodystrophy, unspecified: Secondary | ICD-10-CM

## 2024-07-28 NOTE — Telephone Encounter (Signed)
 Patient called requesting referral to plastic surgery -- per bariatric.   Theresa Morrison [name] Letters, CMA

## 2024-07-28 NOTE — Addendum Note (Signed)
 Addended by: OVERTON FAITH T on: 07/28/2024 04:31 PM   Modules accepted: Orders

## 2024-07-31 ENCOUNTER — Ambulatory Visit (INDEPENDENT_AMBULATORY_CARE_PROVIDER_SITE_OTHER): Admitting: Plastic Surgery

## 2024-07-31 DIAGNOSIS — T8131XD Disruption of external operation (surgical) wound, not elsewhere classified, subsequent encounter: Secondary | ICD-10-CM | POA: Diagnosis not present

## 2024-07-31 DIAGNOSIS — E881 Lipodystrophy, unspecified: Secondary | ICD-10-CM | POA: Diagnosis not present

## 2024-07-31 DIAGNOSIS — Z9889 Other specified postprocedural states: Secondary | ICD-10-CM

## 2024-07-31 NOTE — Progress Notes (Signed)
 Theresa Morrison returns today for follow-up after bilateral breast reduction.  She has a very nice result despite the fact that she had some wound separation along the right inframammary incision.  The area where there is wound separation is healing nicely and is essentially completely reepithelialized.  She will continue to moisturize this area and will follow-up with me in approximately a month.  We did discuss her questions of the lipodystrophy.  Her primary concern is a protuberant abdomen.  I showed her that there is very little subcutaneous fat above the abdominal wall and most of the fat is intra-abdominal and that there is no good way to address this with surgery.  She is scheduled to see one of my partners regarding this and I told her she should keep that for second opinion.  I have offered to refer her to healthy weight and wellness as she is interested in diet and exercise to help decrease some of the intra-abdominal fat.  Follow-up with me in 1 month sooner if needed.

## 2024-07-31 NOTE — Addendum Note (Signed)
 Addended by: Cheryll Keisler, MARYKATE K on: 07/31/2024 10:32 AM   Modules accepted: Orders

## 2024-08-06 ENCOUNTER — Encounter: Payer: Self-pay | Admitting: Physician Assistant

## 2024-08-06 ENCOUNTER — Telehealth: Payer: Self-pay

## 2024-08-06 ENCOUNTER — Ambulatory Visit (INDEPENDENT_AMBULATORY_CARE_PROVIDER_SITE_OTHER): Admitting: Physician Assistant

## 2024-08-06 VITALS — BP 100/60 | HR 76 | Ht 63.0 in | Wt 159.4 lb

## 2024-08-06 DIAGNOSIS — Z860100 Personal history of colon polyps, unspecified: Secondary | ICD-10-CM | POA: Diagnosis not present

## 2024-08-06 DIAGNOSIS — Z8673 Personal history of transient ischemic attack (TIA), and cerebral infarction without residual deficits: Secondary | ICD-10-CM

## 2024-08-06 DIAGNOSIS — K5904 Chronic idiopathic constipation: Secondary | ICD-10-CM | POA: Diagnosis not present

## 2024-08-06 MED ORDER — PEG 3350-KCL-NA BICARB-NACL 420 G PO SOLR: 4000.0000 mL | Freq: Once | ORAL | 0 refills | Status: AC

## 2024-08-06 NOTE — Patient Instructions (Addendum)
 For Constipation Try: Samples of Linzess 72 mcg QD for 1 week,  then 145 mcg QD for 1 week,  then 290 mcg QD for 1 week.   Please let me know which dose works best, and then we can send in a prescription.    You have been scheduled for a Colonoscopy. Please follow written instructions given to you at your visit today.   If you use inhalers (even only as needed), please bring them with you on the day of your procedure.  DO NOT TAKE 7 DAYS PRIOR TO TEST- Trulicity (dulaglutide) Ozempic, Wegovy (semaglutide) Mounjaro (tirzepatide) Bydureon Bcise (exanatide extended release)  DO NOT TAKE 1 DAY PRIOR TO YOUR TEST Rybelsus (semaglutide) Adlyxin (lixisenatide) Victoza (liraglutide) Byetta (exanatide) ___________________________________________________________________________  Please follow up sooner if symptoms increase or worsen   Due to recent changes in healthcare laws, you may see the results of your imaging and laboratory studies on MyChart before your provider has had a chance to review them.  We understand that in some cases there may be results that are confusing or concerning to you. Not all laboratory results come back in the same time frame and the provider may be waiting for multiple results in order to interpret others.  Please give us  48 hours in order for your provider to thoroughly review all the results before contacting the office for clarification of your results.   Thank you for trusting me with your gastrointestinal care!   Ellouise Console, PA-C _______________________________________________________  If your blood pressure at your visit was 140/90 or greater, please contact your primary care physician to follow up on this.  _______________________________________________________  If you are age 80 or older, your body mass index should be between 23-30. Your Body mass index is 28.23 kg/m. If this is out of the aforementioned range listed, please consider follow up with  your Primary Care Provider.  If you are age 62 or younger, your body mass index should be between 19-25. Your Body mass index is 28.23 kg/m. If this is out of the aformentioned range listed, please consider follow up with your Primary Care Provider.   ________________________________________________________  The [facility] providers would like to encourage you to use MYCHART to communicate with providers for non-urgent requests or questions.  Due to long hold times on the telephone, sending your provider a message by [facility] may be a faster and more efficient way to get a response.  Please allow 48 business hours for a response.  Please remember that this is for non-urgent requests.  _______________________________________________________

## 2024-08-06 NOTE — Telephone Encounter (Deleted)
" °  Theresa Morrison 11-Nov-1955 [id number]  08/06/24   Dear Marvine Rush, MD:  We have scheduled the above named patient for a(n) Colonoscopy procedure. Our records show that (s)he is on anticoagulation therapy.  Please advise as to whether the patient may come off their therapy of Plavix  5 days prior to their procedure which is scheduled for 09/10/24.  Please route your response to Alethea Blocker, CMA or fax response to [phone].  Sincerely,    [facility]   "

## 2024-08-06 NOTE — Telephone Encounter (Signed)
 Clearance foe Plavix  hold

## 2024-08-06 NOTE — Progress Notes (Signed)
 Noted

## 2024-08-06 NOTE — Progress Notes (Signed)
 z     Theresa Console, PA-C [address] [place], [place]  [address] Phone: [phone]   Gastroenterology Consultation  Referring Provider:     Marvine Rush, MD Primary Care Physician:  Marvine Rush, MD Primary Gastroenterologist:  Theresa Console, PA-C / Rush Kiang, MD  Reason for Consultation:     Repeat colonoscopy; history of colon polyps        HPI:   Discussed the use of AI scribe software for clinical note transcription with the patient, who gave verbal consent to proceed.  69 year old female, established patient Dr. Kiang, presents to schedule repeat colonoscopy.  She has history of adenomatous colon polyps.  06/2019 last colonoscopy: 4 small polyps removed (1 mm to 2 mm).  Pathology showed 1 tubular adenoma and 3 colon mucosal polyps.  Internal hemorrhoids.  Good prep.  5-year repeat colonoscopy was recommended due to previous history of colon polyps.  History of Present Illness Theresa Morrison is a 69 year old woman with chronic idiopathic constipation and colon polyps who presents for follow-up and colonoscopy planning.  Colonic Polyps: - Colonoscopy in December 2020 revealed four small polyps: one precancerous and three benign - Repeat colonoscopy recommended in five years (due 06/2024).  Bowel Habit Abnormalities: - Chronic constipation present since childhood - Bowel movements occur as infrequently as once a week, and in the past, up to once a month - Symptoms were more severe during menstrual periods - Stool consistency varies, with occasional hard stools and straining; sometimes stools are regular - No abdominal pain or rectal bleeding - Constipation does not cause discomfort - No issues with colonoscopy preparation in the past - Uses Fiber gummies and OTC milk of magnesia as needed - Previously tried Miralax which did not work well   PMH: HIV, CVA, hemolytic anemia, CKD, neuropathy.  Currently on Plavix  prescribed by her PCP Dr. Marvine.    Past  Medical History:  Diagnosis Date   Anemia    Autoimmune hemolytic anemia (HCC)    Colon polyps    adenomatous   CVA (cerebral vascular accident) (HCC) 2000   Headache    HIV positive (HCC) 1987   Hypercholesterolemia    Hyperlipidemia    Internal hemorrhoids    Shingles    Toxoplasmosis     Past Surgical History:  Procedure Laterality Date   APPENDECTOMY  2001   BREAST REDUCTION SURGERY Bilateral 04/11/2024   Procedure: MAMMOPLASTY, REDUCTION;  Surgeon: Waddell Leonce NOVAK, MD;  Location: [facility];  Service: Plastics;  Laterality: Bilateral;   KNEE ARTHROSCOPY Right    x 2   SHOULDER SURGERY Right    TONSILLECTOMY     ULNAR NERVE TRANSPOSITION Left 03/20/2017   Procedure: LEFT ELBOW ULNAR NERVE DECOMPRESSION;  Surgeon: Theresa Kuba, MD;  Location: [facility];  Service: Orthopedics;  Laterality: Left;    Prior to Admission medications  Medication Sig Start Date End Date Taking? Authorizing Provider  atorvastatin  (LIPITOR) 10 MG tablet Take 10 mg by mouth daily.    [provider]  calcium  carbonate (OS-CAL) 600 MG TABS Take 1,200 mg by mouth daily.     [provider]  Cholecalciferol (VITAMIN D-3) 1000 units CAPS Take 1,000 Units by mouth daily.    [provider]  clopidogrel  (PLAVIX ) 75 MG tablet TAKE 1 TABLET BY MOUTH DAILY 11/06/16   Elaine Rush, MD  faricimab-svoa (VABYSMO) 6 MG/0.05ML SOLN intravitreal injection 6 mg by Intravitreal route See admin instructions. Eye injection every  4 months in office    [provider]  TRIUMEQ  600-50-300 MG tablet TAKE 1 TABLET BY MOUTH EVERY DAY 07/14/24   Vu, Constance DASEN, MD    Family History  Problem Relation Age of Onset   Breast cancer Mother 38       mets to lung and brain   Lung cancer Mother    Brain cancer Mother    Heart attack Father    High Cholesterol Father    Prostate cancer Brother    Macular degeneration Brother    Prostate cancer Brother    Prostate  cancer Brother    Irritable bowel syndrome Son    Stroke Neg Hx    Migraines Neg Hx    Neuropathy Neg Hx    Colon cancer Neg Hx    Esophageal cancer Neg Hx    Pancreatic cancer Neg Hx    Stomach cancer Neg Hx    Liver disease Neg Hx      Social History[1]  Allergies as of 08/06/2024 - Review Complete 08/06/2024  Allergen Reaction Noted   Bactrim [sulfamethoxazole-trimethoprim]     Penicillins     Stavudine     Sulfa antibiotics Rash 03/03/2017    Review of Systems:    All systems reviewed and negative except where noted in HPI.   Physical Exam:  BP 100/60 (BP Location: Left Arm, Patient Position: Sitting, Cuff Size: Normal)   Pulse 76   Ht 5' 3 (1.6 m)   Wt 159 lb 6 oz (72.3 kg)   BMI 28.23 kg/m  No LMP recorded. Patient is postmenopausal.  General:   Alert,  Well-developed, well-nourished, pleasant and cooperative in NAD Lungs:  Respirations even and unlabored.  Clear throughout to auscultation.   No wheezes, crackles, or rhonchi. No acute distress. Heart:  Regular rate and rhythm; no murmurs, clicks, rubs, or gallops. Abdomen:  Normal bowel sounds.  No bruits.  Soft, and non-distended without masses, hepatosplenomegaly or hernias noted.  No Tenderness.  No guarding or rebound tenderness.    Neurologic:  Alert and oriented x3;  grossly normal neurologically. Psych:  Alert and cooperative. Normal mood and affect.   Imaging Studies: DG Chest 2 View Result Date: 07/21/2024 EXAM: 2 VIEW(S) XRAY OF THE CHEST 07/21/2024 07:05:49 PM COMPARISON: 09/22/2023 CLINICAL HISTORY: Chest congestion FINDINGS: LUNGS AND PLEURA: No focal pulmonary opacity. No pleural effusion. No pneumothorax. HEART AND MEDIASTINUM: No acute abnormality of the cardiac and mediastinal silhouettes. BONES AND SOFT TISSUES: No acute osseous abnormality. DIAPHRAGM AND UPPER ABDOMEN: Moderate hiatal hernia. IMPRESSION: 1. No acute cardiopulmonary findings. 2. Moderate hiatal hernia. Electronically signed by:  Dorethia Molt MD 07/21/2024 09:37 PM EST RP Workstation: [id number]    Labs: CBC    Component Value Date/Time   WBC 2.6 (L) 06/24/2024 0905   RBC 3.94 06/24/2024 0905   HGB 13.1 06/24/2024 0905   HCT 39.5 06/24/2024 0905   PLT 162 06/24/2024 0905   MCV 100.3 06/24/2024 0905    CMP     Component Value Date/Time   NA 144 06/24/2024 0905   K 3.7 06/24/2024 0905   CL 108 06/24/2024 0905   CO2 28 06/24/2024 0905   GLUCOSE 85 06/24/2024 0905   BUN 9 06/24/2024 0905   CREATININE 1.00 06/24/2024 0905   CALCIUM  9.5 06/24/2024 0905   PROT 5.9 (L) 06/24/2024 0905   ALBUMIN 3.9 09/22/2023 1207   AST 23 06/24/2024 0905   ALT 18 06/24/2024 0905   ALKPHOS 136 (H) 09/22/2023 1207  BILITOT 0.4 06/24/2024 0905   GFRNONAA 56 (L) 09/22/2023 1207   GFRNONAA 51 (L) 05/09/2017 1157   GFRAA 59 (L) 09/04/2017 1034   GFRAA 60 05/09/2017 1157    Assessment and Plan:   Theresa Morrison is a 69 y.o. y/o female has been referred for:  1.  History of colon polyps - Scheduling Colonoscopy I discussed risks of colonoscopy with patient to include risk of bleeding, colon perforation, and risk of sedation.  Patient expressed understanding and agrees to proceed with colonoscopy.   2.  Comorbidities: History of CVA, currently on Plavix . - Request permission to hold Plavix  5 days prior to colonoscopy procedure  3.  Chronic Constipation - Gave samples of Linzess 72 mcg QD for 1 week, then 145 mcg QD for 1 week.  Patient will let me know which dose works best, and then we can send a prescription.    Assessment & Plan Chronic idiopathic constipation Chronic constipation with infrequent bowel movements and occasional hard stools. Discussed pharmacologic options including linaclotide. - Recommended daily polyethylene glycol (Miralax) as first-line therapy. - Provided linaclotide 72 mcg samples for one week, with instructions to increase to 145 mcg daily if needed. - Advised to notify via MyChart if  she prefers linaclotide or requires a higher dose for prescription. - Emphasized optimizing constipation management prior to colonoscopy for adequate bowel preparation.  Personal history of colon polyps Four small polyps, including one precancerous, removed in 2020. Asymptomatic and due for surveillance colonoscopy per guidelines. - Initiated scheduling of surveillance colonoscopy (5 years from last procedure). - Reviewed protocol to hold clopidogrel  five days prior to colonoscopy and resume immediately post-procedure.   Follow up PRN.  Theresa Console, PA-C       [1]  Social History Tobacco Use   Smoking status: Never   Smokeless tobacco: Never  Vaping Use   Vaping status: Never Used  Substance Use Topics   Alcohol use: No    Alcohol/week: 0.0 standard drinks of alcohol   Drug use: No

## 2024-08-08 NOTE — Telephone Encounter (Signed)
 A letter requesting to hold Plavix  was faxed to Dr. Marvine.  I will follow up for response.

## 2024-08-21 NOTE — Telephone Encounter (Signed)
 Called Dr. Norleen Core office at [phone].  Number is no longer in service. Called and spoke to the patient.  She said he is now with [facility] in [place].  Their Phone: [phone]  fax: [phone] .  Reprinting letter and faxing to Dr. Marvine at [place].  Note: patient just saw the patient yesterday.   Theresa Morrison 1955/10/09 [id number]  08/21/24   Dear Dr. Marvine:  We have scheduled the above named patient for a(n) colonoscopy procedure. Our records show that she is on anticoagulation therapy.  Please advise as to whether the patient may come off their therapy of PLAVIX  5 days prior to their procedure which is scheduled for 09-10-24 with Dr. Abran.  Please route your response to [facility], CMA or fax response to [phone].  Sincerely,    [facility]

## 2024-08-27 NOTE — Telephone Encounter (Signed)
 Called and spoke to Rossie at Dr. Marvine office at [facility]).  She was unable to find our request. She asked me to refax it to [phone] and said she would look for it and ask Dr. Marvine to respond tomorrow since he is in on Thursday but off on Friday. Faxed and confirmation rec'd.

## 2024-08-29 NOTE — Telephone Encounter (Signed)
 Called Dr. Bertha office to check on clearance for this patient. Waited on hold for 10 mins and Lorianne states she will check if Olam can come to the phone. Waited on hold another 5 mins and then the line was disconnected. Will attempt call again later.

## 2024-09-03 ENCOUNTER — Ambulatory Visit: Admitting: Plastic Surgery

## 2024-09-10 ENCOUNTER — Encounter: Admitting: Internal Medicine

## 2025-06-10 ENCOUNTER — Other Ambulatory Visit: Payer: Self-pay

## 2025-06-24 ENCOUNTER — Ambulatory Visit: Payer: Self-pay | Admitting: Internal Medicine
